# Patient Record
Sex: Female | Born: 1935 | Race: White | Hispanic: No | Marital: Single | Smoking: Never smoker
Health system: Southern US, Community
[De-identification: ages and names within clinical notes are randomized; demographics above are authoritative.]

## PROBLEM LIST (undated history)

## (undated) DIAGNOSIS — K922 Gastrointestinal hemorrhage, unspecified: Secondary | ICD-10-CM

## (undated) DIAGNOSIS — S82009A Unspecified fracture of unspecified patella, initial encounter for closed fracture: Secondary | ICD-10-CM

## (undated) DIAGNOSIS — I10 Essential (primary) hypertension: Secondary | ICD-10-CM

## (undated) DIAGNOSIS — E039 Hypothyroidism, unspecified: Secondary | ICD-10-CM

## (undated) DIAGNOSIS — S72009A Fracture of unspecified part of neck of unspecified femur, initial encounter for closed fracture: Secondary | ICD-10-CM

## (undated) DIAGNOSIS — I495 Sick sinus syndrome: Secondary | ICD-10-CM

## (undated) DIAGNOSIS — I509 Heart failure, unspecified: Secondary | ICD-10-CM

## (undated) DIAGNOSIS — M1990 Unspecified osteoarthritis, unspecified site: Secondary | ICD-10-CM

## (undated) DIAGNOSIS — M810 Age-related osteoporosis without current pathological fracture: Secondary | ICD-10-CM

## (undated) DIAGNOSIS — E785 Hyperlipidemia, unspecified: Secondary | ICD-10-CM

## (undated) DIAGNOSIS — I350 Nonrheumatic aortic (valve) stenosis: Secondary | ICD-10-CM

## (undated) DIAGNOSIS — R0682 Tachypnea, not elsewhere classified: Secondary | ICD-10-CM

## (undated) DIAGNOSIS — Z950 Presence of cardiac pacemaker: Secondary | ICD-10-CM

## (undated) DIAGNOSIS — I4891 Unspecified atrial fibrillation: Secondary | ICD-10-CM

## (undated) HISTORY — DX: Gastrointestinal hemorrhage, unspecified: K92.2

## (undated) HISTORY — PX: HUMERUS FRACTURE SURGERY: SHX670

## (undated) HISTORY — DX: Sick sinus syndrome: I49.5

## (undated) HISTORY — DX: Unspecified osteoarthritis, unspecified site: M19.90

## (undated) HISTORY — DX: Tachypnea, not elsewhere classified: R06.82

## (undated) HISTORY — DX: Fracture of unspecified part of neck of unspecified femur, initial encounter for closed fracture: S72.009A

## (undated) HISTORY — DX: Nonrheumatic aortic (valve) stenosis: I35.0

## (undated) HISTORY — DX: Heart failure, unspecified: I50.9

## (undated) HISTORY — DX: Age-related osteoporosis without current pathological fracture: M81.0

---

## 2009-09-28 ENCOUNTER — Encounter: Admission: RE | Admit: 2009-09-28 | Discharge: 2009-12-01 | Payer: Self-pay

## 2011-08-09 ENCOUNTER — Emergency Department (HOSPITAL_COMMUNITY): Payer: Non-veteran care

## 2011-08-09 ENCOUNTER — Encounter (HOSPITAL_COMMUNITY): Payer: Self-pay

## 2011-08-09 ENCOUNTER — Inpatient Hospital Stay (HOSPITAL_COMMUNITY)
Admission: EM | Admit: 2011-08-09 | Discharge: 2011-08-16 | DRG: 469 | Disposition: A | Discharge status: Skilled Nursing Facility | Payer: Non-veteran care | Attending: Internal Medicine | Admitting: Internal Medicine

## 2011-08-09 DIAGNOSIS — E785 Hyperlipidemia, unspecified: Secondary | ICD-10-CM | POA: Diagnosis present

## 2011-08-09 DIAGNOSIS — Y9229 Other specified public building as the place of occurrence of the external cause: Secondary | ICD-10-CM

## 2011-08-09 DIAGNOSIS — S42213A Unspecified displaced fracture of surgical neck of unspecified humerus, initial encounter for closed fracture: Secondary | ICD-10-CM

## 2011-08-09 DIAGNOSIS — W010XXA Fall on same level from slipping, tripping and stumbling without subsequent striking against object, initial encounter: Secondary | ICD-10-CM | POA: Diagnosis present

## 2011-08-09 DIAGNOSIS — Y998 Other external cause status: Secondary | ICD-10-CM

## 2011-08-09 DIAGNOSIS — R141 Gas pain: Secondary | ICD-10-CM | POA: Diagnosis present

## 2011-08-09 DIAGNOSIS — R142 Eructation: Secondary | ICD-10-CM | POA: Diagnosis present

## 2011-08-09 DIAGNOSIS — D62 Acute posthemorrhagic anemia: Secondary | ICD-10-CM | POA: Diagnosis not present

## 2011-08-09 DIAGNOSIS — Z79899 Other long term (current) drug therapy: Secondary | ICD-10-CM

## 2011-08-09 DIAGNOSIS — Z7901 Long term (current) use of anticoagulants: Secondary | ICD-10-CM

## 2011-08-09 DIAGNOSIS — R011 Cardiac murmur, unspecified: Secondary | ICD-10-CM

## 2011-08-09 DIAGNOSIS — I350 Nonrheumatic aortic (valve) stenosis: Secondary | ICD-10-CM | POA: Diagnosis present

## 2011-08-09 DIAGNOSIS — R140 Abdominal distension (gaseous): Secondary | ICD-10-CM | POA: Diagnosis present

## 2011-08-09 DIAGNOSIS — S72009A Fracture of unspecified part of neck of unspecified femur, initial encounter for closed fracture: Secondary | ICD-10-CM | POA: Diagnosis present

## 2011-08-09 DIAGNOSIS — E039 Hypothyroidism, unspecified: Secondary | ICD-10-CM | POA: Diagnosis present

## 2011-08-09 DIAGNOSIS — I4891 Unspecified atrial fibrillation: Secondary | ICD-10-CM | POA: Diagnosis present

## 2011-08-09 DIAGNOSIS — I359 Nonrheumatic aortic valve disorder, unspecified: Secondary | ICD-10-CM | POA: Diagnosis present

## 2011-08-09 DIAGNOSIS — I959 Hypotension, unspecified: Secondary | ICD-10-CM | POA: Diagnosis not present

## 2011-08-09 DIAGNOSIS — R0682 Tachypnea, not elsewhere classified: Secondary | ICD-10-CM

## 2011-08-09 DIAGNOSIS — A498 Other bacterial infections of unspecified site: Secondary | ICD-10-CM | POA: Diagnosis present

## 2011-08-09 DIAGNOSIS — N390 Urinary tract infection, site not specified: Secondary | ICD-10-CM | POA: Diagnosis present

## 2011-08-09 DIAGNOSIS — G9340 Encephalopathy, unspecified: Secondary | ICD-10-CM | POA: Diagnosis not present

## 2011-08-09 DIAGNOSIS — Z950 Presence of cardiac pacemaker: Secondary | ICD-10-CM

## 2011-08-09 DIAGNOSIS — I10 Essential (primary) hypertension: Secondary | ICD-10-CM | POA: Diagnosis present

## 2011-08-09 HISTORY — DX: Hypothyroidism, unspecified: E03.9

## 2011-08-09 HISTORY — DX: Hyperlipidemia, unspecified: E78.5

## 2011-08-09 HISTORY — DX: Presence of cardiac pacemaker: Z95.0

## 2011-08-09 HISTORY — DX: Unspecified atrial fibrillation: I48.91

## 2011-08-09 HISTORY — DX: Essential (primary) hypertension: I10

## 2011-08-09 LAB — CBC WITH DIFFERENTIAL/PLATELET
Basophils Absolute: 0 10*3/uL (ref 0.0–0.1)
Eosinophils Relative: 0 % (ref 0–5)
Lymphocytes Relative: 18 % (ref 12–46)
MCV: 93 fL (ref 78.0–100.0)
Platelets: 212 10*3/uL (ref 150–400)
RDW: 13.4 % (ref 11.5–15.5)
WBC: 15.1 10*3/uL — ABNORMAL HIGH (ref 4.0–10.5)

## 2011-08-09 LAB — BASIC METABOLIC PANEL
CO2: 26 mEq/L (ref 19–32)
Calcium: 9.6 mg/dL (ref 8.4–10.5)
GFR calc non Af Amer: 72 mL/min — ABNORMAL LOW (ref >90)
Sodium: 143 mEq/L (ref 135–145)

## 2011-08-09 MED ORDER — FENTANYL CITRATE 0.05 MG/ML IJ SOLN
100.0000 ug | Freq: Once | INTRAMUSCULAR | Status: AC
  Administered 2011-08-09: 100 ug via INTRAVENOUS
  Filled 2011-08-09: qty 2

## 2011-08-09 NOTE — ED Notes (Signed)
Pt's POA called with contact info: Lamar Blinks (son) cell # [phone], home # [phone]. POA states he is in [place] now but will be back tomorrow.

## 2011-08-09 NOTE — ED Notes (Addendum)
Pt was at church at fell, injuring L hip. Pt states pain is greater in the L foot than leg or hip. Pt's L leg is shorter and externally rotated. Current pain 3/10. 50 mcg fentanyl given previously.

## 2011-08-09 NOTE — ED Notes (Signed)
Pt was at church when her clothing hooked on an object, she fell and hurt her L hip.  Per EMS L foot inverted, though pt was sitting on arrival and in pain 9/10.  Pt given 50 mcg fentanyl which reduced pain to 3/10.  Per ems, pt has pacemaker but is unclear of other medical hx or medications.

## 2011-08-10 ENCOUNTER — Encounter (HOSPITAL_COMMUNITY)
Admission: EM | Disposition: A | Discharge status: Skilled Nursing Facility | Payer: Self-pay | Source: Home / Self Care | Attending: Internal Medicine

## 2011-08-10 ENCOUNTER — Encounter (HOSPITAL_COMMUNITY): Payer: Self-pay | Admitting: Internal Medicine

## 2011-08-10 DIAGNOSIS — R140 Abdominal distension (gaseous): Secondary | ICD-10-CM | POA: Diagnosis present

## 2011-08-10 DIAGNOSIS — Z7901 Long term (current) use of anticoagulants: Secondary | ICD-10-CM

## 2011-08-10 DIAGNOSIS — N390 Urinary tract infection, site not specified: Secondary | ICD-10-CM | POA: Diagnosis present

## 2011-08-10 DIAGNOSIS — I4891 Unspecified atrial fibrillation: Secondary | ICD-10-CM

## 2011-08-10 DIAGNOSIS — R011 Cardiac murmur, unspecified: Secondary | ICD-10-CM

## 2011-08-10 DIAGNOSIS — R143 Flatulence: Secondary | ICD-10-CM

## 2011-08-10 DIAGNOSIS — S72009A Fracture of unspecified part of neck of unspecified femur, initial encounter for closed fracture: Secondary | ICD-10-CM | POA: Diagnosis present

## 2011-08-10 DIAGNOSIS — S42213A Unspecified displaced fracture of surgical neck of unspecified humerus, initial encounter for closed fracture: Secondary | ICD-10-CM

## 2011-08-10 LAB — COMPREHENSIVE METABOLIC PANEL
ALT: 13 U/L (ref 0–35)
Alkaline Phosphatase: 56 U/L (ref 39–117)
CO2: 23 mEq/L (ref 19–32)
Chloride: 101 mEq/L (ref 96–112)
GFR calc Af Amer: 89 mL/min — ABNORMAL LOW (ref >90)
GFR calc non Af Amer: 77 mL/min — ABNORMAL LOW (ref >90)
Glucose, Bld: 120 mg/dL — ABNORMAL HIGH (ref 70–99)
Potassium: 3.7 mEq/L (ref 3.5–5.1)
Sodium: 136 mEq/L (ref 135–145)
Total Protein: 6.5 g/dL (ref 6.0–8.3)

## 2011-08-10 LAB — CARDIAC PANEL(CRET KIN+CKTOT+MB+TROPI)
CK, MB: 4 ng/mL (ref 0.3–4.0)
Relative Index: 2 (ref 0.0–2.5)
Total CK: 104 U/L (ref 7–177)
Total CK: 205 U/L — ABNORMAL HIGH (ref 7–177)
Troponin I: <0.3 ng/mL (ref <0.30)

## 2011-08-10 LAB — PROTIME-INR: Prothrombin Time: 23.8 seconds — ABNORMAL HIGH (ref 11.6–15.2)

## 2011-08-10 LAB — URINE MICROSCOPIC-ADD ON

## 2011-08-10 LAB — URINALYSIS, ROUTINE W REFLEX MICROSCOPIC
Bilirubin Urine: NEGATIVE
Glucose, UA: NEGATIVE mg/dL
Specific Gravity, Urine: 1.014 (ref 1.005–1.030)
pH: 7.5 (ref 5.0–8.0)

## 2011-08-10 LAB — TYPE AND SCREEN
ABO/RH(D): O POS
Antibody Screen: NEGATIVE

## 2011-08-10 LAB — CBC
HCT: 41.3 % (ref 36.0–46.0)
MCHC: 33.7 g/dL (ref 30.0–36.0)
RDW: 13.2 % (ref 11.5–15.5)

## 2011-08-10 SURGERY — HEMIARTHROPLASTY, HIP, DIRECT ANTERIOR APPROACH, FOR FRACTURE
Anesthesia: General | Laterality: Left

## 2011-08-10 MED ORDER — METOPROLOL TARTRATE 25 MG PO TABS
25.0000 mg | ORAL_TABLET | Freq: Two times a day (BID) | ORAL | Status: DC
  Administered 2011-08-10 – 2011-08-15 (×9): 25 mg via ORAL
  Filled 2011-08-10 (×13): qty 1

## 2011-08-10 MED ORDER — AMIODARONE HCL IN DEXTROSE 360-4.14 MG/200ML-% IV SOLN
0.5000 mg/min | INTRAVENOUS | Status: DC
  Administered 2011-08-11 – 2011-08-13 (×5): 0.5 mg/min via INTRAVENOUS
  Filled 2011-08-10 (×13): qty 200

## 2011-08-10 MED ORDER — ONDANSETRON HCL 4 MG/2ML IJ SOLN
4.0000 mg | Freq: Four times a day (QID) | INTRAMUSCULAR | Status: DC | PRN
  Administered 2011-08-12: 4 mg via INTRAVENOUS

## 2011-08-10 MED ORDER — SODIUM CHLORIDE 0.9 % IJ SOLN
3.0000 mL | Freq: Two times a day (BID) | INTRAMUSCULAR | Status: DC
  Administered 2011-08-11: 3 mL via INTRAVENOUS

## 2011-08-10 MED ORDER — AMIODARONE HCL IN DEXTROSE 360-4.14 MG/200ML-% IV SOLN
1.0000 mg/min | INTRAVENOUS | Status: AC
  Administered 2011-08-10 (×2): 1 mg/min via INTRAVENOUS
  Filled 2011-08-10 (×3): qty 200

## 2011-08-10 MED ORDER — ONDANSETRON HCL 4 MG PO TABS: 4.0000 mg | ORAL_TABLET | Freq: Four times a day (QID) | ORAL | Status: DC | PRN

## 2011-08-10 MED ORDER — SODIUM CHLORIDE 0.9 % IV SOLN
INTRAVENOUS | Status: AC
  Administered 2011-08-10: 06:00:00 via INTRAVENOUS

## 2011-08-10 MED ORDER — ACETAMINOPHEN 650 MG RE SUPP: 650.0000 mg | Freq: Four times a day (QID) | RECTAL | Status: DC | PRN

## 2011-08-10 MED ORDER — PHYTONADIONE 5 MG PO TABS
2.5000 mg | ORAL_TABLET | Freq: Once | ORAL | Status: AC
  Administered 2011-08-10: 2.5 mg via ORAL
  Filled 2011-08-10: qty 1

## 2011-08-10 MED ORDER — METOPROLOL TARTRATE 1 MG/ML IV SOLN: 2.5000 mg | Freq: Two times a day (BID) | INTRAVENOUS | Status: DC

## 2011-08-10 MED ORDER — DEXTROSE 5 % IV SOLN
1.0000 g | INTRAVENOUS | Status: DC
  Administered 2011-08-10 – 2011-08-15 (×5): 1 g via INTRAVENOUS
  Filled 2011-08-10 (×6): qty 10

## 2011-08-10 MED ORDER — DILTIAZEM LOAD VIA INFUSION
10.0000 mg | Freq: Once | INTRAVENOUS | Status: AC
  Administered 2011-08-10: 10 mg via INTRAVENOUS
  Filled 2011-08-10: qty 10

## 2011-08-10 MED ORDER — DILTIAZEM HCL 25 MG/5ML IV SOLN
5.0000 mg | Freq: Once | INTRAVENOUS | Status: AC | PRN
  Administered 2011-08-10: 5 mg via INTRAVENOUS
  Filled 2011-08-10 (×2): qty 5

## 2011-08-10 MED ORDER — ALBUTEROL SULFATE (5 MG/ML) 0.5% IN NEBU: 2.5000 mg | INHALATION_SOLUTION | RESPIRATORY_TRACT | Status: DC | PRN

## 2011-08-10 MED ORDER — ACETAMINOPHEN 325 MG PO TABS: 650.0000 mg | ORAL_TABLET | Freq: Four times a day (QID) | ORAL | Status: DC | PRN

## 2011-08-10 MED ORDER — DILTIAZEM HCL 25 MG/5ML IV SOLN
5.0000 mg | Freq: Once | INTRAVENOUS | Status: AC
  Administered 2011-08-10: 5 mg via INTRAVENOUS
  Filled 2011-08-10: qty 5

## 2011-08-10 MED ORDER — ONDANSETRON HCL 4 MG/2ML IJ SOLN
4.0000 mg | Freq: Once | INTRAMUSCULAR | Status: DC
  Filled 2011-08-10: qty 2

## 2011-08-10 MED ORDER — MORPHINE SULFATE 2 MG/ML IJ SOLN
2.0000 mg | INTRAMUSCULAR | Status: DC | PRN
  Administered 2011-08-10 – 2011-08-11 (×5): 2 mg via INTRAVENOUS
  Filled 2011-08-10 (×5): qty 1

## 2011-08-10 MED ORDER — FENTANYL CITRATE 0.05 MG/ML IJ SOLN: 50.0000 ug | Freq: Once | INTRAMUSCULAR | Status: DC

## 2011-08-10 MED ORDER — DILTIAZEM HCL 100 MG IV SOLR
5.0000 mg/h | INTRAVENOUS | Status: DC
  Administered 2011-08-10: 5 mg/h via INTRAVENOUS
  Filled 2011-08-10: qty 100

## 2011-08-10 MED ORDER — AMIODARONE LOAD VIA INFUSION
150.0000 mg | Freq: Once | INTRAVENOUS | Status: AC
  Administered 2011-08-10: 150 mg via INTRAVENOUS
  Filled 2011-08-10: qty 83.34

## 2011-08-10 NOTE — ED Provider Notes (Signed)
Medical screening examination/treatment/procedure(s) were conducted as a shared visit with non-physician practitioner(s) and myself.  I personally evaluated the patient during the encounter. Patient evaluated for left hip pain after fall. Tender over left greater trochanter with skin intact and left lower extremity deformity present. Distal neurovascular intact. IV pain control with narcotics. Orthopedic consultation and plan medical admission.  Sunnie Nielsen, MD 08/10/11 [phone]

## 2011-08-10 NOTE — Progress Notes (Signed)
Referral rec'd for ?SNF. Patient is a 78yo with a hip fx and may require SNF at d/c. Patient currently unavailable to discuss d/c plans with me (echo?). Will ask CSW covering tomorrow to assess and begin discussions related to d/c needs (?SNF). Reece Levy, MSW, Theresia Majors [phone]

## 2011-08-10 NOTE — Consult Note (Signed)
Reason for Consult: Left hip fracture Referring Physician:  Dr. Shaune Pollack [name] is an 78 y.o. female.  HPI:  Patient brought to ER by EMS after a fall at church, she got caught on a strap on the ground and twisted her leg than fell injuring her left hip. The leg per EMS is shortened on the left side. The gave 50 mcg of fentanyl in route, which helped reduce the pain to a 3/10. The patient denies hitting her head or having neck pain. She has a pmh of pacemaker, hypertension and A-fib. Per family that patient is very cognitively aware without dementia. The patient appears uncomfortable but is in NAD. Dr. Charlann Boxer was consulted to see the patient for potential surgery. The case is discussed with the patient and family, however the son who is the power of attorney is not present. His number is given to [place] by family and he will be contacted. At this time the potential surgery is discussed as well as the risks, benefits and expectation, as well as the risks, benefits and expectation if surgery is not decided upon.  Past Medical History  Diagnosis Date  . Pacemaker   . A-fib   . Hypertension   . Hypothyroidism   . Hyperlipemia     Past Surgical History  Procedure Date  . Humerus fracture surgery     Family History  Problem Relation Age of Onset  . Bipolar disorder Mother     Social History:  reports that she has never smoked. She does not have any smokeless tobacco history on file. She reports that she drinks alcohol. She reports that she does not use illicit drugs.  Allergies: No Known Allergies  Medications: I have reviewed the patient's current medications.  Results for orders placed during the hospital encounter of 08/09/11 (from the past 48 hour(s))  CBC WITH DIFFERENTIAL     Status: Abnormal   Collection Time   08/09/11 10:56 PM      Component Value Range Comment   WBC 15.1 (*) 4.0 - 10.5 K/uL    RBC 4.46  3.87 - 5.11 MIL/uL    Hemoglobin 14.2  12.0 - 15.0 g/dL    HCT 91.4   78.2 - 95.6 %    MCV 93.0  78.0 - 100.0 fL    MCH 31.8  26.0 - 34.0 pg    MCHC 34.2  30.0 - 36.0 g/dL    RDW 21.3  08.6 - 57.8 %    Platelets 212  150 - 400 K/uL    Neutrophils Relative 77  43 - 77 %    Neutro Abs 11.6 (*) 1.7 - 7.7 K/uL    Lymphocytes Relative 18  12 - 46 %    Lymphs Abs 2.8  0.7 - 4.0 K/uL    Monocytes Relative 4  3 - 12 %    Monocytes Absolute 0.7  0.1 - 1.0 K/uL    Eosinophils Relative 0  0 - 5 %    Eosinophils Absolute 0.1  0.0 - 0.7 K/uL    Basophils Relative 0  0 - 1 %    Basophils Absolute 0.0  0.0 - 0.1 K/uL   BASIC METABOLIC PANEL     Status: Abnormal   Collection Time   08/09/11 10:56 PM      Component Value Range Comment   Sodium 143  135 - 145 mEq/L    Potassium 4.1  3.5 - 5.1 mEq/L    Chloride 104  96 -  112 mEq/L    CO2 26  19 - 32 mEq/L    Glucose, Bld 122 (*) 70 - 99 mg/dL    BUN 21  6 - 23 mg/dL    Creatinine, Ser 5.28  0.50 - 1.10 mg/dL    Calcium 9.6  8.4 - 41.3 mg/dL    GFR calc non Af Amer 72 (*) >90 mL/min    GFR calc Af Amer 84 (*) >90 mL/min   TYPE AND SCREEN     Status: Normal   Collection Time   08/09/11 11:30 PM      Component Value Range Comment   ABO/RH(D) O POS      Antibody Screen NEG      Sample Expiration 08/12/2011     ABO/RH     Status: Normal   Collection Time   08/09/11 11:30 PM      Component Value Range Comment   ABO/RH(D) O POS     PROTIME-INR     Status: Abnormal   Collection Time   08/10/11 12:41 AM      Component Value Range Comment   Prothrombin Time 23.8 (*) 11.6 - 15.2 seconds    INR 2.09 (*) 0.00 - 1.49   URINALYSIS, ROUTINE W REFLEX MICROSCOPIC     Status: Abnormal   Collection Time   08/10/11  1:03 AM      Component Value Range Comment   Color, Urine YELLOW  YELLOW    APPearance CLEAR  CLEAR    Specific Gravity, Urine 1.014  1.005 - 1.030    pH 7.5  5.0 - 8.0    Glucose, UA NEGATIVE  NEGATIVE mg/dL    Hgb urine dipstick TRACE (*) NEGATIVE    Bilirubin Urine NEGATIVE  NEGATIVE    Ketones, ur  NEGATIVE  NEGATIVE mg/dL    Protein, ur NEGATIVE  NEGATIVE mg/dL    Urobilinogen, UA 0.2  0.0 - 1.0 mg/dL    Nitrite POSITIVE (*) NEGATIVE    Leukocytes, UA TRACE (*) NEGATIVE   URINE MICROSCOPIC-ADD ON     Status: Abnormal   Collection Time   08/10/11  1:03 AM      Component Value Range Comment   Squamous Epithelial / LPF RARE  RARE    WBC, UA 11-20  <3 WBC/hpf    RBC / HPF 3-6  <3 RBC/hpf    Bacteria, UA MANY (*) RARE   TSH     Status: Normal   Collection Time   08/10/11  6:45 AM      Component Value Range Comment   TSH 0.357  0.350 - 4.500 uIU/mL   COMPREHENSIVE METABOLIC PANEL     Status: Abnormal   Collection Time   08/10/11  6:45 AM      Component Value Range Comment   Sodium 136  135 - 145 mEq/L    Potassium 3.7  3.5 - 5.1 mEq/L    Chloride 101  96 - 112 mEq/L    CO2 23  19 - 32 mEq/L    Glucose, Bld 120 (*) 70 - 99 mg/dL    BUN 18  6 - 23 mg/dL    Creatinine, Ser 2.44  0.50 - 1.10 mg/dL    Calcium 9.0  8.4 - 01.0 mg/dL    Total Protein 6.5  6.0 - 8.3 g/dL    Albumin 3.6  3.5 - 5.2 g/dL    AST 20  0 - 37 U/L    ALT 13  0 - 35  U/L    Alkaline Phosphatase 56  39 - 117 U/L    Total Bilirubin 0.4  0.3 - 1.2 mg/dL    GFR calc non Af Amer 77 (*) >90 mL/min    GFR calc Af Amer 89 (*) >90 mL/min   CBC     Status: Abnormal   Collection Time   08/10/11  6:45 AM      Component Value Range Comment   WBC 15.2 (*) 4.0 - 10.5 K/uL    RBC 4.44  3.87 - 5.11 MIL/uL    Hemoglobin 13.9  12.0 - 15.0 g/dL    HCT 40.9  81.1 - 91.4 %    MCV 93.0  78.0 - 100.0 fL    MCH 31.3  26.0 - 34.0 pg    MCHC 33.7  30.0 - 36.0 g/dL    RDW 78.2  95.6 - 21.3 %    Platelets 199  150 - 400 K/uL   CARDIAC PANEL(CRET KIN+CKTOT+MB+TROPI)     Status: Abnormal   Collection Time   08/10/11  9:54 AM      Component Value Range Comment   Total CK 104  7 - 177 U/L    CK, MB 3.4  0.3 - 4.0 ng/mL    Troponin I <0.30  <0.30 ng/mL    Relative Index 3.3 (*) 0.0 - 2.5     Dg Chest 1 View  08/09/2011   *RADIOLOGY REPORT*  Clinical Data: Preoperative exam, left hip fracture  CHEST - 1 VIEW  Comparison: None.  Findings: Dual lead left pacemaker in place.  Heart size is mildly enlarged without overt edema.  Mild central vascular congestion. No pleural effusion.  The minimal bibasilar curvilinear scarring or atelectasis.  IMPRESSION: Mild cardiomegaly without overt edema or focal acute finding.  Original Report Authenticated By: Harrel Lemon, M.D.   Dg Hip Complete Left  08/09/2011  *RADIOLOGY REPORT*  Clinical Data: Hip pain, fall  LEFT HIP - COMPLETE 2+ VIEW  Comparison: None.  Findings: Mildly angulated acute left femoral neck fracture identified.  Left femoral head is properly located.  No evidence of previous right femoral neck screw fixation.  Mild degenerative changes at the symphysis pubis.  Normal visualized bowel gas pattern.  No displaced pelvic fracture.  IMPRESSION: Mildly angulated acute left femoral neck fracture.  Original Report Authenticated By: Harrel Lemon, M.D.    Review of Systems  Constitutional: Negative.   HENT: Negative.   Eyes: Negative.   Respiratory: Negative.   Cardiovascular: Positive for palpitations.  Gastrointestinal: Negative.   Genitourinary: Negative.   Musculoskeletal: Positive for joint pain (left hip) and falls.  Skin: Negative.   Neurological: Negative.   Endo/Heme/Allergies: Negative.   Psychiatric/Behavioral: Negative.    Blood pressure 112/65, pulse 134, temperature 97.7 F (36.5 C), temperature source Oral, resp. rate 21, height 5\' 2"  (1.575 m), weight 125 lb 1.6 oz (56.745 kg), SpO2 99.00%. Physical Exam  Constitutional: She appears well-developed and well-nourished.  HENT:  Head: Normocephalic and atraumatic.  Nose: Nose normal.  Mouth/Throat: Oropharynx is clear and moist.  Eyes: Pupils are equal, round, and reactive to light.  Neck: Neck supple. No JVD present. No tracheal deviation present. No thyromegaly present.    Cardiovascular: Normal rate and intact distal pulses.   Murmur heard. Respiratory: Effort normal and breath sounds normal. No respiratory distress. She has no wheezes. She has no rales. She exhibits no tenderness.  GI: Soft. There is no tenderness. There is no guarding.  Musculoskeletal:  Left hip: She exhibits decreased range of motion (pain with any motion), decreased strength, tenderness, bony tenderness and swelling. She exhibits no deformity and no laceration.  Lymphadenopathy:    She has no cervical adenopathy.  Skin: Skin is warm and dry.    Assessment/Plan: Acute left femoral neck fracture   Will discuss the patient case with her son, who is the power of attorney Awaiting medical clearance to proceed with surgery, if that how they would like to proceed   [name] 08/10/2011, 2:52 PM

## 2011-08-10 NOTE — Consult Note (Signed)
Reason for Consult: A. fib with RVR Referring Physician: Dr. Shaune Pollack [name] is an 78 y.o. female.  HPI: Patient is 78 year old female with past medical history significant for hypertension, hypothyroidism, history of paroxysmal A. fib, probable tachybradycardia syndrome, status post permanent pacemaker, degenerative joint disease, hypercholesteremia, was admitted yesterday following fall and sustained left hip fracture. Patient denies any palpitation lightheadedness or syncopal episode. Denies any chest pain pressure tightness heaviness nausea or vomiting diaphoresis. Denies history of PND orthopnea leg swelling. Denies any history of  rheumatic fever. Patient denies any history of exertional dyspnea on exertional chest pain although activity is limited due to her age. Patient received IV Cardizem and started on drip and also by mouth Lopressor but continues to have heart rate in the 120s to 130s. Admission EKG showed patient was in sinus rhythm.  Past Medical History  Diagnosis Date  . Pacemaker   . A-fib   . Hypertension   . Hypothyroidism   . Hyperlipemia     Past Surgical History  Procedure Date  . Humerus fracture surgery     Family History  Problem Relation Age of Onset  . Bipolar disorder Mother     Social History:  reports that she has never smoked. She does not have any smokeless tobacco history on file. She reports that she drinks alcohol. She reports that she does not use illicit drugs.  Allergies: No Known Allergies  Medications: I have reviewed the patient's current medications.  Results for orders placed during the hospital encounter of 08/09/11 (from the past 48 hour(s))  CBC WITH DIFFERENTIAL     Status: Abnormal   Collection Time   08/09/11 10:56 PM      Component Value Range Comment   WBC 15.1 (*) 4.0 - 10.5 K/uL    RBC 4.46  3.87 - 5.11 MIL/uL    Hemoglobin 14.2  12.0 - 15.0 g/dL    HCT 16.1  09.6 - 04.5 %    MCV 93.0  78.0 - 100.0 fL    MCH 31.8   26.0 - 34.0 pg    MCHC 34.2  30.0 - 36.0 g/dL    RDW 40.9  81.1 - 91.4 %    Platelets 212  150 - 400 K/uL    Neutrophils Relative 77  43 - 77 %    Neutro Abs 11.6 (*) 1.7 - 7.7 K/uL    Lymphocytes Relative 18  12 - 46 %    Lymphs Abs 2.8  0.7 - 4.0 K/uL    Monocytes Relative 4  3 - 12 %    Monocytes Absolute 0.7  0.1 - 1.0 K/uL    Eosinophils Relative 0  0 - 5 %    Eosinophils Absolute 0.1  0.0 - 0.7 K/uL    Basophils Relative 0  0 - 1 %    Basophils Absolute 0.0  0.0 - 0.1 K/uL   BASIC METABOLIC PANEL     Status: Abnormal   Collection Time   08/09/11 10:56 PM      Component Value Range Comment   Sodium 143  135 - 145 mEq/L    Potassium 4.1  3.5 - 5.1 mEq/L    Chloride 104  96 - 112 mEq/L    CO2 26  19 - 32 mEq/L    Glucose, Bld 122 (*) 70 - 99 mg/dL    BUN 21  6 - 23 mg/dL    Creatinine, Ser 7.82  0.50 - 1.10 mg/dL  Calcium 9.6  8.4 - 10.5 mg/dL    GFR calc non Af Amer 72 (*) >90 mL/min    GFR calc Af Amer 84 (*) >90 mL/min   TYPE AND SCREEN     Status: Normal   Collection Time   08/09/11 11:30 PM      Component Value Range Comment   ABO/RH(D) O POS      Antibody Screen NEG      Sample Expiration 08/12/2011     ABO/RH     Status: Normal   Collection Time   08/09/11 11:30 PM      Component Value Range Comment   ABO/RH(D) O POS     PROTIME-INR     Status: Abnormal   Collection Time   08/10/11 12:41 AM      Component Value Range Comment   Prothrombin Time 23.8 (*) 11.6 - 15.2 seconds    INR 2.09 (*) 0.00 - 1.49   URINALYSIS, ROUTINE W REFLEX MICROSCOPIC     Status: Abnormal   Collection Time   08/10/11  1:03 AM      Component Value Range Comment   Color, Urine YELLOW  YELLOW    APPearance CLEAR  CLEAR    Specific Gravity, Urine 1.014  1.005 - 1.030    pH 7.5  5.0 - 8.0    Glucose, UA NEGATIVE  NEGATIVE mg/dL    Hgb urine dipstick TRACE (*) NEGATIVE    Bilirubin Urine NEGATIVE  NEGATIVE    Ketones, ur NEGATIVE  NEGATIVE mg/dL    Protein, ur NEGATIVE  NEGATIVE mg/dL     Urobilinogen, UA 0.2  0.0 - 1.0 mg/dL    Nitrite POSITIVE (*) NEGATIVE    Leukocytes, UA TRACE (*) NEGATIVE   URINE MICROSCOPIC-ADD ON     Status: Abnormal   Collection Time   08/10/11  1:03 AM      Component Value Range Comment   Squamous Epithelial / LPF RARE  RARE    WBC, UA 11-20  <3 WBC/hpf    RBC / HPF 3-6  <3 RBC/hpf    Bacteria, UA MANY (*) RARE   TSH     Status: Normal   Collection Time   08/10/11  6:45 AM      Component Value Range Comment   TSH 0.357  0.350 - 4.500 uIU/mL   COMPREHENSIVE METABOLIC PANEL     Status: Abnormal   Collection Time   08/10/11  6:45 AM      Component Value Range Comment   Sodium 136  135 - 145 mEq/L    Potassium 3.7  3.5 - 5.1 mEq/L    Chloride 101  96 - 112 mEq/L    CO2 23  19 - 32 mEq/L    Glucose, Bld 120 (*) 70 - 99 mg/dL    BUN 18  6 - 23 mg/dL    Creatinine, Ser 9.60  0.50 - 1.10 mg/dL    Calcium 9.0  8.4 - 45.4 mg/dL    Total Protein 6.5  6.0 - 8.3 g/dL    Albumin 3.6  3.5 - 5.2 g/dL    AST 20  0 - 37 U/L    ALT 13  0 - 35 U/L    Alkaline Phosphatase 56  39 - 117 U/L    Total Bilirubin 0.4  0.3 - 1.2 mg/dL    GFR calc non Af Amer 77 (*) >90 mL/min    GFR calc Af Amer 89 (*) >90 mL/min  CBC     Status: Abnormal   Collection Time   08/10/11  6:45 AM      Component Value Range Comment   WBC 15.2 (*) 4.0 - 10.5 K/uL    RBC 4.44  3.87 - 5.11 MIL/uL    Hemoglobin 13.9  12.0 - 15.0 g/dL    HCT 16.1  09.6 - 04.5 %    MCV 93.0  78.0 - 100.0 fL    MCH 31.3  26.0 - 34.0 pg    MCHC 33.7  30.0 - 36.0 g/dL    RDW 40.9  81.1 - 91.4 %    Platelets 199  150 - 400 K/uL   CARDIAC PANEL(CRET KIN+CKTOT+MB+TROPI)     Status: Abnormal   Collection Time   08/10/11  9:54 AM      Component Value Range Comment   Total CK 104  7 - 177 U/L    CK, MB 3.4  0.3 - 4.0 ng/mL    Troponin I <0.30  <0.30 ng/mL    Relative Index 3.3 (*) 0.0 - 2.5     Dg Chest 1 View  08/09/2011  *RADIOLOGY REPORT*  Clinical Data: Preoperative exam, left hip fracture   CHEST - 1 VIEW  Comparison: None.  Findings: Dual lead left pacemaker in place.  Heart size is mildly enlarged without overt edema.  Mild central vascular congestion. No pleural effusion.  The minimal bibasilar curvilinear scarring or atelectasis.  IMPRESSION: Mild cardiomegaly without overt edema or focal acute finding.  Original Report Authenticated By: Harrel Lemon, M.D.   Dg Hip Complete Left  08/09/2011  *RADIOLOGY REPORT*  Clinical Data: Hip pain, fall  LEFT HIP - COMPLETE 2+ VIEW  Comparison: None.  Findings: Mildly angulated acute left femoral neck fracture identified.  Left femoral head is properly located.  No evidence of previous right femoral neck screw fixation.  Mild degenerative changes at the symphysis pubis.  Normal visualized bowel gas pattern.  No displaced pelvic fracture.  IMPRESSION: Mildly angulated acute left femoral neck fracture.  Original Report Authenticated By: Harrel Lemon, M.D.    Review of Systems  Constitutional: Negative for fever and chills.  Eyes: Negative for blurred vision and double vision.  Respiratory: Positive for cough. Negative for hemoptysis and sputum production.   Cardiovascular: Negative for chest pain, palpitations, orthopnea and claudication.  Gastrointestinal: Negative for heartburn, nausea and vomiting.  Neurological: Negative for dizziness and headaches.   Blood pressure 108/62, pulse 136, temperature 98 F (36.7 C), temperature source Oral, resp. rate 20, height 5\' 2"  (1.575 m), weight 56.745 kg (125 lb 1.6 oz), SpO2 98.00%. Physical Exam  Constitutional: She is oriented to person, place, and time.  HENT:  Head: Atraumatic.  Eyes: Conjunctivae are normal. Pupils are equal, round, and reactive to light. Left eye exhibits no discharge. Scleral icterus is present.  Neck: Neck supple. No tracheal deviation present. No thyromegaly present.  Respiratory: Effort normal and breath sounds normal. No respiratory distress. She has no wheezes.  She has no rales.  GI: Soft. Bowel sounds are normal. She exhibits distension. There is no tenderness. There is no rebound.  Musculoskeletal: She exhibits no edema and no tenderness.  Neurological: She is alert and oriented to person, place, and time.    Assessment/Plan: Atrial fibrillation with rapid ventricular response Status post fall/fracture left hip Hypertension  probable tachybradycardia syndrome status post permanent pacemaker Hypercholesteremia Degenerative joint disease UTI Hypothyroidism History of syncope in the past Plan Agree with present management  Check 2-D echo Check TSH Will start IV amiodarone per orders Check old records   [facility] N 08/10/2011, 12:48 PM

## 2011-08-10 NOTE — Progress Notes (Signed)
*  PRELIMINARY RESULTS* Echocardiogram 2D Echocardiogram has been performed.  [name] 08/10/2011, 3:43 PM

## 2011-08-10 NOTE — Progress Notes (Signed)
UR Completed.  Sakoya Win Jane [phone] 08/10/2011  

## 2011-08-10 NOTE — Progress Notes (Signed)
Patient admitted earlier today. H&P reviewed.  PCP: [facility]  Brief HPI:  [name] is a 78 y.o. female with a past medical history of Pacemaker; A-fib; Hypertension; Hypothyroidism; and Hyperlipemia.  Presented with Fall on day of admission after tripping on a purse while at church. She denied any head injury. She has been able to ambulate well at baseline. 3 years ago she says she had a syncopal episode and her heart stopped and ever since she needed a pacemaker. Denied any chest pain or dyspnea on exertion. Stated she is not sure if she ever had an echo but probably had cardiac catheterization 3 years ago.   Past medical history:  Past Medical History  Diagnosis Date  . Pacemaker   . A-fib   . Hypertension   . Hypothyroidism   . Hyperlipemia     Consultants: Gaylord Shih Charlann Boxer)  Procedures: None so far  Subjective: Patient complains of pain in left leg. Seems slightly agitated because of the pain. Denies any other complaints. No CP or SOB.  Objective: Vital signs in last 24 hours: Temp:  [97.9 F (36.6 C)-98 F (36.7 C)] 98 F (36.7 C) (07/18 0519) Pulse Rate:  [74-136] 136  (07/18 0907) Resp:  [20-28] 20  (07/18 0546) BP: (100-148)/(19-106) 108/62 mmHg (07/18 0907) SpO2:  [83 %-99 %] 98 % (07/18 0519) FiO2 (%):  [2 %] 2 % (07/17 2344) Weight:  [56.745 kg (125 lb 1.6 oz)] 56.745 kg (125 lb 1.6 oz) (07/18 0519) Weight change:  Last BM Date: 08/09/11  Intake/Output from previous day: 07/17 0701 - 07/18 0700 In: -  Out: 1300 [Urine:1300] Intake/Output this shift:    General appearance: alert, appears stated age, distracted and no distress Head: Normocephalic, without obvious abnormality, atraumatic Eyes: conjunctivae/corneas clear. PERRL, EOM's intact.  Resp: clear to auscultation bilaterally Cardio: irregularly irregular, tachycardic. Systolic murmur. No click, rub or gallop GI: soft, non-tender; bowel sounds normal; no masses,  no organomegaly Extremities: Left LE  externally rotated. Pulses: 2+ and symmetric Skin: Skin color, texture, turgor normal. No rashes or lesions Neurologic: Slightly agitated. Knew the place, date, month, year. No focal deficits.  Lab Results:  Basename 08/10/11 0645 08/09/11 2256  WBC 15.2* 15.1*  HGB 13.9 14.2  HCT 41.3 41.5  PLT 199 212   BMET  Basename 08/10/11 0645 08/09/11 2256  NA 136 143  K 3.7 4.1  CL 101 104  CO2 23 26  GLUCOSE 120* 122*  BUN 18 21  CREATININE 0.62 0.76  CALCIUM 9.0 9.6  ALT 13 --    Studies/Results: Dg Chest 1 View  08/09/2011  *RADIOLOGY REPORT*  Clinical Data: Preoperative exam, left hip fracture  CHEST - 1 VIEW  Comparison: None.  Findings: Dual lead left pacemaker in place.  Heart size is mildly enlarged without overt edema.  Mild central vascular congestion. No pleural effusion.  The minimal bibasilar curvilinear scarring or atelectasis.  IMPRESSION: Mild cardiomegaly without overt edema or focal acute finding.  Original Report Authenticated By: Harrel Lemon, M.D.   Dg Hip Complete Left  08/09/2011  *RADIOLOGY REPORT*  Clinical Data: Hip pain, fall  LEFT HIP - COMPLETE 2+ VIEW  Comparison: None.  Findings: Mildly angulated acute left femoral neck fracture identified.  Left femoral head is properly located.  No evidence of previous right femoral neck screw fixation.  Mild degenerative changes at the symphysis pubis.  Normal visualized bowel gas pattern.  No displaced pelvic fracture.  IMPRESSION: Mildly angulated acute left femoral neck  fracture.  Original Report Authenticated By: Harrel Lemon, M.D.    Medications:  Scheduled:    . cefTRIAXone (ROCEPHIN) IVPB 1 gram/50 mL D5W  1 g Intravenous Q24H  . diltiazem  5 mg Intravenous Once  . fentaNYL  100 mcg Intravenous Once  . fentaNYL  50 mcg Intravenous Once  . metoprolol tartrate  25 mg Oral BID  . ondansetron  4 mg Intravenous Once  . phytonadione  2.5 mg Oral Once  . sodium chloride  3 mL Intravenous Q12H  .  DISCONTD: metoprolol  2.5 mg Intravenous Q12H   Continuous:    . sodium chloride 50 mL/hr at 08/10/11 0543   ZOX:[id number], acetaminophen, albuterol, diltiazem, morphine injection, ondansetron (ZOFRAN) IV, ondansetron  Assessment/Plan:  Principal Problem:  *Atrial fibrillation with rapid ventricular response Active Problems:  Hip fracture  UTI (lower urinary tract infection)  Abdominal distention  Atrial fibrillation  Murmur, cardiac  Chronic anticoagulation    Atrial Fibrillation with RVR HR high probably due to pain and agitation. Temporary response to IV diltiazem bolus. Have given her metoprolol orally. If HR doesn't get controlled she will need infusion. Cycle cardiac enzymes. ECHO is pending. Obtain records from [place].  Left Hip fracture Awaiting consult by orthopedics. From medical stand point patient likely has valvular disease of unclear severity. Would obtain echocardiogram to evaluate further and will obtain records regarding cardiac catherization. Patient should not undergo surgery till ECHO is done. May need to involve cardiology as well due to tachyarrythmia.  Chronic Anticoagulation Patient is anticoagulated with history of atrial fibrillation. Will need to recheck INR. Warfarin has been discontinued.  UTI (lower urinary tract infection) Continue Ceftriaxone. Follow up urine culture.  Abdominal distention None noted this morning. Abdomen is soft and non tender. Monitor.  Code Status Full Code  DVT Prophylaxis INR was therapeutic. Will need to resume post operatively.  Will discuss with Son.   LOS: 1 day   [facility]  [facility] Pager [phone] 08/10/2011, 9:43 AM

## 2011-08-10 NOTE — Progress Notes (Signed)
  Amiodarone Drug - Drug Interaction Consult Note  Recommendations: - Coumadin: now on hold but when/if resumed, watch for drug interaction as amiodarone can increase INR, coumadin requirements may decrease -Metoprolol: watch for bradycardia  Amiodarone is metabolized by the cytochrome P450 system and therefore has the potential to cause many drug interactions. Amiodarone has an average plasma half-life of 50 days (range 20 to 100 days).   There is potential for drug interactions to occur several weeks or months after stopping treatment and the onset of drug interactions may be slow after initiating amiodarone.   []  Statins: Increased risk of myopathy. Simvastatin- restrict dose to 20mg  daily. Other statins: counsel patients to report any muscle pain or weakness immediately.  [x]  Anticoagulants: Amiodarone can increase anticoagulant effect. Consider warfarin dose reduction. Patients should be monitored closely and the dose of anticoagulant altered accordingly, remembering that amiodarone levels take several weeks to stabilize.  []  Antiepileptics: Amiodarone can increase plasma concentration of phenytoin, phenytoin dose should be reduced. Note that small changes in phenytoin dose can result in large changes in phenytoin levels. Monitor patient closely and counsel on signs of toxicity.  [x]  Beta blockers: increased risk of bradycardia, AV block and myocardial depression. Sotalol - avoid concomitant use.  []   Calcium channel blockers (diltiazem and verapamil): increased risk of bradycardia, AV block and myocardial depression.  []   Cyclosporine: Amiodarone increases levels of cyclosporine. Reduced dose of cyclosporine is recommended.  []  Digoxin dose should be halved when amiodarone is started.  []  Diuretics: increased risk of cardiotoxicity if hypokalemia occurs.  []  Oral hypoglycemic agents (glyburide, glipizide, glimepiride): increased risk of hypoglycemia. Patient's glucose levels should  be monitored closely when initiating amiodarone therapy.   []  Drugs that prolong the QT interval: Concurrent therapy is contraindicated due to the increased risk of torsades de pointes; . Antibiotics: e.g. fluoroquinolones, erythromycin. . Antiarrhythmics: e.g. quinidine, procainamide, disopyramide, sotalol. . Antipsychotics: e.g. phenothiazines, haloperidol.  . Lithium, tricyclic antidepressants, and methadone. Thank You,  Fredrik Rigger  08/10/2011 1:10 PM

## 2011-08-10 NOTE — H&P (Signed)
PCP:  [facility]   Chief Complaint:   Hip pain  HPI: [name] is a 78 y.o. female   has a past medical history of Pacemaker; A-fib; Hypertension; Hypothyroidism; and Hyperlipemia.   Presented with  Fall today after tripping on a purse while at church. She denies any head injury. She is able to ambulate well at baseline. 3 years ago she says she had a syncopal episode and her heart stopped eversince she needed a pacemaker.  Denies any chest pain  Or dyspnea on exertion. States she is not sure if she ever had an echo but probably had cardiac catheterization 3 years ago. All her records should be in [place].  Patient did mention she have been having trouble with abdominal distention and attributes this to constipation.   Review of Systems:    Pertinent positives include: hip pain, abdominal distention  Constitutional:  No weight loss, night sweats, Fevers, chills, fatigue, weight loss  HEENT:  No headaches, Difficulty swallowing,Tooth/dental problems,Sore throat,  No sneezing, itching, ear ache, nasal congestion, post nasal drip,  Cardio-vascular:  No chest pain, Orthopnea, PND, anasarca, dizziness, palpitations.no Bilateral lower extremity swelling  GI:  No heartburn, indigestion, abdominal pain, nausea, vomiting, diarrhea, change in bowel habits, loss of appetite, melena, blood in stool, hematemesis Resp:  no shortness of breath at rest. No dyspnea on exertion, No excess mucus, no productive cough, No non-productive cough, No coughing up of blood.No change in color of mucus.No wheezing. Skin:  no rash or lesions. No jaundice GU:  no dysuria, change in color of urine, no urgency or frequency. No straining to urinate.  No flank pain.  Musculoskeletal:  No joint pain or no joint swelling. No decreased range of motion. No back pain.  Psych:  No change in mood or affect. No depression or anxiety. No memory loss.  Neuro: no localizing neurological complaints, no tingling, no  weakness, no double vision, no gait abnormality, no slurred speech, no confusion  Otherwise ROS are negative except for above, 10 systems were reviewed  Past Medical History: Past Medical History  Diagnosis Date  . Pacemaker   . A-fib   . Hypertension   . Hypothyroidism   . Hyperlipemia    Past Surgical History  Procedure Date  . Humerus fracture surgery      Medications: Prior to Admission medications   Medication Sig Start Date End Date Taking? Authorizing Provider  amLODipine (NORVASC) 5 MG tablet Take 5 mg by mouth daily.   Yes Historical Provider, MD  feeding supplement (BOOST HIGH PROTEIN) LIQD Take 1 Container by mouth every morning.   Yes Historical Provider, MD  levothyroxine (SYNTHROID, LEVOTHROID) 100 MCG tablet Take 100 mcg by mouth daily.   Yes Historical Provider, MD  metoprolol tartrate (LOPRESSOR) 25 MG tablet Take 25 mg by mouth 2 (two) times daily.   Yes Historical Provider, MD  rosuvastatin (CRESTOR) 5 MG tablet Take 2.5 mg by mouth at bedtime.   Yes Historical Provider, MD  traZODone (DESYREL) 50 MG tablet Take 25 mg by mouth at bedtime.   Yes Historical Provider, MD  warfarin (COUMADIN) 5 MG tablet Take 5 mg by mouth at bedtime. Pt takes 5mg  6 days per week. 1 day per week pt takes 2.5mg  on sunday   Yes Historical Provider, MD    Allergies:  No Known Allergies  Social History:  Ambulatory  independently Lives at home alone   reports that she has never smoked. She does not have any smokeless  tobacco history on file. She reports that she drinks alcohol. She reports that she does not use illicit drugs.   Family History: family history includes Bipolar disorder in her mother.    Physical Exam: Patient Vitals for the past 24 hrs:  BP Temp Temp src Pulse SpO2  08/09/11 2344 118/60 mmHg - - 81  95 %  08/09/11 2234 108/70 mmHg 97.9 F (36.6 C) Oral - 95 %    1. General:  in No Acute distress 2. Psychological: Alert and  Oriented 3. Head/ENT:    Dry  Mucous Membranes                          Head Non traumatic, neck supple                           Poor Dentition 4. SKIN:  decreased Skin turgor,  Skin clean Dry and intact no rash 5. Heart: Regular rate and rhythm loud ejection  Murmur, Rub or gallop 6. Lungs: Clear to auscultation bilaterally, no wheezes or crackles   7. Abdomen: Soft, suprapubic-tenderness,  distended 8. Lower extremities: no clubbing, cyanosis, or edema 9. Neurologically Grossly intact, moving all 4 extremities equally 10. MSK: Normal range of motion of upper extr. Left leg painful  body mass index is unknown because there is no height or weight on file.   Labs on Admission:   [facility] 08/09/11 2256  NA 143  K 4.1  CL 104  CO2 26  GLUCOSE 122*  BUN 21  CREATININE 0.76  CALCIUM 9.6  MG --  PHOS --   No results found for this basename: AST:2,ALT:2,ALKPHOS:2,BILITOT:2,PROT:2,ALBUMIN:2 in the last 72 hours No results found for this basename: LIPASE:2,AMYLASE:2 in the last 72 hours  Basename 08/09/11 2256  WBC 15.1*  NEUTROABS 11.6*  HGB 14.2  HCT 41.5  MCV 93.0  PLT 212   No results found for this basename: CKTOTAL:3,CKMB:3,CKMBINDEX:3,TROPONINI:3 in the last 72 hours No results found for this basename: TSH,T4TOTAL,FREET3,T3FREE,THYROIDAB in the last 72 hours No results found for this basename: VITAMINB12:2,FOLATE:2,FERRITIN:2,TIBC:2,IRON:2,RETICCTPCT:2 in the last 72 hours No results found for this basename: HGBA1C    CrCl is unknown because there is no height on file for the current visit. ABG No results found for this basename: phart, pco2, po2, hco3, tco2, acidbasedef, o2sat     No results found for this basename: DDIMER     Other results:   UA Evidence of infection   Cultures: No results found for this basename: sdes, specrequest, cult, reptstatus       Radiological Exams on Admission: Dg Chest 1 View  08/09/2011  *RADIOLOGY REPORT*  Clinical Data: Preoperative exam, left hip  fracture  CHEST - 1 VIEW  Comparison: None.  Findings: Dual lead left pacemaker in place.  Heart size is mildly enlarged without overt edema.  Mild central vascular congestion. No pleural effusion.  The minimal bibasilar curvilinear scarring or atelectasis.  IMPRESSION: Mild cardiomegaly without overt edema or focal acute finding.  Original Report Authenticated By: Harrel Lemon, M.D.   Dg Hip Complete Left  08/09/2011  *RADIOLOGY REPORT*  Clinical Data: Hip pain, fall  LEFT HIP - COMPLETE 2+ VIEW  Comparison: None.  Findings: Mildly angulated acute left femoral neck fracture identified.  Left femoral head is properly located.  No evidence of previous right femoral neck screw fixation.  Mild degenerative changes at the symphysis pubis.  Normal visualized  bowel gas pattern.  No displaced pelvic fracture.  IMPRESSION: Mildly angulated acute left femoral neck fracture.  Original Report Authenticated By: Harrel Lemon, M.D.    Chart has been reviewed  Assessment/Plan  77 yo F who had a mechanical fall resulting in left femoral neck fracture, she was noted to have UTI and  likely has aortic stenosis  Present on Admission:  .Hip fracture - awaiting consult by orthopedics. From medical stand point patient likely has valvular disease of unclear severity. Would obtain echogram to evaluate further and try to obtain records.  Patient is anticoagulated with history of atrial fibrillation will need to recheck INR. Will stop coumadin and give small dose of vitamin K.   .UTI (lower urinary tract infection) - rocephin, urine culture .Abdominal distention - patient refusing any further evaluation at this point would recommend readdressing in am, she is stable, no peritoneal signs noted.   .Atrial fibrillation - will obtain ECG, while NPO for possible OR time will give metoprolol IV, hold off on coumadin .Murmur, cardiac - will obtain records from [facility] and obtain echogram.    Prophylaxis: SCD  Protonix  CODE STATUS: FULL CODE discussed with patient  Other plan as per orders.  I have spent a total of 55 min on this admission  Quintessa Simmerman 08/10/2011, 2:04 AM

## 2011-08-10 NOTE — ED Provider Notes (Signed)
History     CSN: [id number]  Arrival date & time 08/09/11  2202   First MD Initiated Contact with Patient 08/09/11 2234      Chief Complaint  Patient presents with  . Hip Pain    L    (Consider location/radiation/quality/duration/timing/severity/associated sxs/prior treatment) HPI  Patient brought to ER by EMS after a fall at church, she got caught on a strap on the ground and twisted her leg than fell injuring her left hip. The leg per EMS is shortened on the left side. The gave 50 mcg of fentanyl in route, which helped reduce the pain to a 3/10. The patient denies hitting her head or having neck pain. She has a pmh of pacemaker, hypertension and A-fib. Per family that patient is very cognitively aware without dementia. The patient appears uncomfortable put is in NAD.  Past Medical History  Diagnosis Date  . Pacemaker   . A-fib   . Hypertension     Past Surgical History  Procedure Date  . Humerus fracture surgery     No family history on file.  History  Substance Use Topics  . Smoking status: Not on file  . Smokeless tobacco: Not on file  . Alcohol Use:     OB History    Grav Para Term Preterm Abortions TAB SAB Ect Mult Living                  Review of Systems   HEENT: denies blurry vision or change in hearing PULMONARY: Denies difficulty breathing and SOB CARDIAC: denies chest pain or heart palpitations MUSCULOSKELETAL:  unable to ambulate ABDOMEN AL: denies abdominal pain GU: denies loss of bowel or urinary control NEURO: denies numbness and tingling in extremities SKIN: no new rashes PSYCH: patient denies anxiety or depression. NECK: Pt denies having neck pain     Allergies  Review of patient's allergies indicates no known allergies.  Home Medications   Current Outpatient Rx  Name Route Sig Dispense Refill  . AMLODIPINE BESYLATE 5 MG PO TABS Oral Take 5 mg by mouth daily.    Marland Kitchen BOOST HIGH PROTEIN PO LIQD Oral Take 1 Container by mouth every  morning.    Marland Kitchen LEVOTHYROXINE SODIUM 100 MCG PO TABS Oral Take 100 mcg by mouth daily.    Marland Kitchen METOPROLOL TARTRATE 25 MG PO TABS Oral Take 25 mg by mouth 2 (two) times daily.    Marland Kitchen ROSUVASTATIN CALCIUM 5 MG PO TABS Oral Take 2.5 mg by mouth at bedtime.    . TRAZODONE HCL 50 MG PO TABS Oral Take 25 mg by mouth at bedtime.    . WARFARIN SODIUM 5 MG PO TABS Oral Take 5 mg by mouth at bedtime. Pt takes 5mg  6 days per week. 1 day per week pt takes 2.5mg .      BP 118/60  Pulse 81  Temp 97.9 F (36.6 C) (Oral)  SpO2 95%  Physical Exam  Nursing note and vitals reviewed. Constitutional: She appears well-developed and well-nourished. No distress.  HENT:  Head: Normocephalic and atraumatic.  Eyes: Pupils are equal, round, and reactive to light.  Neck: Normal range of motion. Neck supple.  Cardiovascular: Normal rate and regular rhythm.   Pulmonary/Chest: Effort normal.  Abdominal: Soft.  Musculoskeletal:       Left hip: She exhibits decreased range of motion, decreased strength, tenderness, bony tenderness and swelling. She exhibits no laceration. Deformity: shortnening of left leg.       Pt has adequate pedal  pulses bilaterally. Her skin is warm and moist Capillary refill is less than 3 seconds in great toe Pt unable to tolerate movement of the leg due to pain  Neurological: She is alert.  Skin: Skin is warm and dry.    ED Course  Procedures (including critical care time)  Labs Reviewed  CBC WITH DIFFERENTIAL - Abnormal; Notable for the following:    WBC 15.1 (*)     Neutro Abs 11.6 (*)     All other components within normal limits  BASIC METABOLIC PANEL - Abnormal; Notable for the following:    Glucose, Bld 122 (*)     GFR calc non Af Amer 72 (*)     GFR calc Af Amer 84 (*)     All other components within normal limits  TYPE AND SCREEN  ABO/RH  PROTIME-INR  URINALYSIS, ROUTINE W REFLEX MICROSCOPIC   Dg Chest 1 View  08/09/2011  *RADIOLOGY REPORT*  Clinical Data: Preoperative  exam, left hip fracture  CHEST - 1 VIEW  Comparison: None.  Findings: Dual lead left pacemaker in place.  Heart size is mildly enlarged without overt edema.  Mild central vascular congestion. No pleural effusion.  The minimal bibasilar curvilinear scarring or atelectasis.  IMPRESSION: Mild cardiomegaly without overt edema or focal acute finding.  Original Report Authenticated By: Harrel Lemon, M.D.   Dg Hip Complete Left  08/09/2011  *RADIOLOGY REPORT*  Clinical Data: Hip pain, fall  LEFT HIP - COMPLETE 2+ VIEW  Comparison: None.  Findings: Mildly angulated acute left femoral neck fracture identified.  Left femoral head is properly located.  No evidence of previous right femoral neck screw fixation.  Mild degenerative changes at the symphysis pubis.  Normal visualized bowel gas pattern.  No displaced pelvic fracture.  IMPRESSION: Mildly angulated acute left femoral neck fracture.  Original Report Authenticated By: Harrel Lemon, M.D.     1. Fx humeral neck       MDM  Dr. Dierdre Highman has seen patient and spoken with family. Pre-operative labs drawn. Pt is on Coumadin therefore will need to be a medicine admit.   Dr. Charlann Boxer with Orthopedics has been consulted on the patient. Patient has been admitted to [facility].        Dorthula Matas, PA 08/10/11 [phone]

## 2011-08-11 ENCOUNTER — Encounter (HOSPITAL_COMMUNITY)
Admission: EM | Disposition: A | Discharge status: Skilled Nursing Facility | Payer: Self-pay | Source: Home / Self Care | Attending: Internal Medicine

## 2011-08-11 ENCOUNTER — Inpatient Hospital Stay (HOSPITAL_COMMUNITY): Payer: Non-veteran care

## 2011-08-11 ENCOUNTER — Encounter (HOSPITAL_COMMUNITY): Payer: Self-pay | Admitting: Anesthesiology

## 2011-08-11 ENCOUNTER — Inpatient Hospital Stay (HOSPITAL_COMMUNITY): Payer: Non-veteran care | Admitting: Anesthesiology

## 2011-08-11 DIAGNOSIS — N390 Urinary tract infection, site not specified: Secondary | ICD-10-CM

## 2011-08-11 DIAGNOSIS — Z7901 Long term (current) use of anticoagulants: Secondary | ICD-10-CM

## 2011-08-11 HISTORY — PX: HIP ARTHROPLASTY: SHX981

## 2011-08-11 LAB — CBC
HCT: 40.1 % (ref 36.0–46.0)
MCH: 30.7 pg (ref 26.0–34.0)
MCHC: 33.4 g/dL (ref 30.0–36.0)
MCV: 92 fL (ref 78.0–100.0)
RDW: 13.4 % (ref 11.5–15.5)

## 2011-08-11 LAB — CARDIAC PANEL(CRET KIN+CKTOT+MB+TROPI): Troponin I: <0.3 ng/mL (ref <0.30)

## 2011-08-11 LAB — BASIC METABOLIC PANEL
BUN: 12 mg/dL (ref 6–23)
CO2: 23 mEq/L (ref 19–32)
Chloride: 100 mEq/L (ref 96–112)
Creatinine, Ser: 0.69 mg/dL (ref 0.50–1.10)
GFR calc Af Amer: 86 mL/min — ABNORMAL LOW (ref >90)

## 2011-08-11 SURGERY — HEMIARTHROPLASTY, HIP, DIRECT ANTERIOR APPROACH, FOR FRACTURE
Anesthesia: General | Site: Hip | Laterality: Left | Wound class: Clean

## 2011-08-11 MED ORDER — ACETAMINOPHEN 325 MG PO TABS: 650.0000 mg | ORAL_TABLET | Freq: Four times a day (QID) | ORAL | Status: DC | PRN

## 2011-08-11 MED ORDER — MIDAZOLAM HCL 5 MG/5ML IJ SOLN
INTRAMUSCULAR | Status: DC | PRN
  Administered 2011-08-11: 1 mg via INTRAVENOUS

## 2011-08-11 MED ORDER — HYDROMORPHONE HCL PF 1 MG/ML IJ SOLN: 0.2500 mg | INTRAMUSCULAR | Status: DC | PRN

## 2011-08-11 MED ORDER — ETOMIDATE 2 MG/ML IV SOLN
INTRAVENOUS | Status: DC | PRN
  Administered 2011-08-11: 20 mg via INTRAVENOUS

## 2011-08-11 MED ORDER — CEFAZOLIN SODIUM-DEXTROSE 2-3 GM-% IV SOLR
INTRAVENOUS | Status: AC
  Filled 2011-08-11: qty 50

## 2011-08-11 MED ORDER — ONDANSETRON HCL 4 MG PO TABS: 4.0000 mg | ORAL_TABLET | Freq: Four times a day (QID) | ORAL | Status: DC | PRN

## 2011-08-11 MED ORDER — ACETAMINOPHEN 10 MG/ML IV SOLN
1000.0000 mg | Freq: Once | INTRAVENOUS | Status: AC | PRN
  Filled 2011-08-11: qty 100

## 2011-08-11 MED ORDER — ONDANSETRON HCL 4 MG/2ML IJ SOLN: 4.0000 mg | Freq: Once | INTRAMUSCULAR | Status: DC | PRN

## 2011-08-11 MED ORDER — LACTATED RINGERS IV SOLN
INTRAVENOUS | Status: DC | PRN
  Administered 2011-08-11: 17:00:00 via INTRAVENOUS

## 2011-08-11 MED ORDER — POTASSIUM CHLORIDE CRYS ER 20 MEQ PO TBCR
40.0000 meq | EXTENDED_RELEASE_TABLET | Freq: Once | ORAL | Status: AC
  Administered 2011-08-11: 40 meq via ORAL
  Filled 2011-08-11: qty 2

## 2011-08-11 MED ORDER — SODIUM CHLORIDE 0.9 % IV SOLN
INTRAVENOUS | Status: DC
  Administered 2011-08-11 – 2011-08-15 (×4): via INTRAVENOUS
  Filled 2011-08-11 (×9): qty 1000

## 2011-08-11 MED ORDER — HYDROCODONE-ACETAMINOPHEN 5-325 MG PO TABS
1.0000 | ORAL_TABLET | Freq: Four times a day (QID) | ORAL | Status: DC | PRN
  Administered 2011-08-12: 1 via ORAL
  Administered 2011-08-12: 2 via ORAL
  Administered 2011-08-13 – 2011-08-14 (×2): 1 via ORAL
  Filled 2011-08-11: qty 1
  Filled 2011-08-11: qty 2
  Filled 2011-08-11 (×3): qty 1
  Filled 2011-08-11: qty 2

## 2011-08-11 MED ORDER — ONDANSETRON HCL 4 MG/2ML IJ SOLN
INTRAMUSCULAR | Status: DC | PRN
  Administered 2011-08-11: 4 mg via INTRAVENOUS

## 2011-08-11 MED ORDER — ONDANSETRON HCL 4 MG/2ML IJ SOLN: 4.0000 mg | Freq: Four times a day (QID) | INTRAMUSCULAR | Status: DC | PRN

## 2011-08-11 MED ORDER — FERROUS SULFATE 325 (65 FE) MG PO TABS
325.0000 mg | ORAL_TABLET | Freq: Three times a day (TID) | ORAL | Status: DC
Start: 2011-08-12 — End: 2011-08-16
  Administered 2011-08-12 – 2011-08-16 (×13): 325 mg via ORAL
  Filled 2011-08-11 (×16): qty 1

## 2011-08-11 MED ORDER — METHOCARBAMOL 100 MG/ML IJ SOLN
500.0000 mg | Freq: Four times a day (QID) | INTRAMUSCULAR | Status: DC | PRN
  Filled 2011-08-11: qty 5

## 2011-08-11 MED ORDER — METOCLOPRAMIDE HCL 5 MG/ML IJ SOLN
5.0000 mg | Freq: Three times a day (TID) | INTRAMUSCULAR | Status: DC | PRN
  Filled 2011-08-11: qty 2

## 2011-08-11 MED ORDER — DOCUSATE SODIUM 100 MG PO CAPS
100.0000 mg | ORAL_CAPSULE | Freq: Two times a day (BID) | ORAL | Status: DC
  Administered 2011-08-11 – 2011-08-16 (×8): 100 mg via ORAL
  Filled 2011-08-11 (×11): qty 1

## 2011-08-11 MED ORDER — ENOXAPARIN SODIUM 40 MG/0.4ML ~~LOC~~ SOLN
40.0000 mg | SUBCUTANEOUS | Status: DC
  Administered 2011-08-12 – 2011-08-16 (×5): 40 mg via SUBCUTANEOUS
  Filled 2011-08-11 (×6): qty 0.4

## 2011-08-11 MED ORDER — PHENOL 1.4 % MT LIQD
1.0000 | OROMUCOSAL | Status: DC | PRN
  Filled 2011-08-11: qty 177

## 2011-08-11 MED ORDER — CEFAZOLIN SODIUM 1-5 GM-% IV SOLN
INTRAVENOUS | Status: DC | PRN
  Administered 2011-08-11: 2 g via INTRAVENOUS

## 2011-08-11 MED ORDER — CEFAZOLIN SODIUM-DEXTROSE 2-3 GM-% IV SOLR: 2.0000 g | Freq: Four times a day (QID) | INTRAVENOUS | Status: DC

## 2011-08-11 MED ORDER — METOCLOPRAMIDE HCL 5 MG PO TABS
5.0000 mg | ORAL_TABLET | Freq: Three times a day (TID) | ORAL | Status: DC | PRN
  Filled 2011-08-11: qty 2

## 2011-08-11 MED ORDER — ACETAMINOPHEN 650 MG RE SUPP: 650.0000 mg | Freq: Four times a day (QID) | RECTAL | Status: DC | PRN

## 2011-08-11 MED ORDER — SUCCINYLCHOLINE CHLORIDE 20 MG/ML IJ SOLN
INTRAMUSCULAR | Status: DC | PRN
  Administered 2011-08-11: 100 mg via INTRAVENOUS

## 2011-08-11 MED ORDER — POLYETHYLENE GLYCOL 3350 17 G PO PACK
17.0000 g | PACK | Freq: Every day | ORAL | Status: DC | PRN
  Filled 2011-08-11: qty 1

## 2011-08-11 MED ORDER — METHOCARBAMOL 500 MG PO TABS
500.0000 mg | ORAL_TABLET | Freq: Four times a day (QID) | ORAL | Status: DC | PRN
  Filled 2011-08-11: qty 1

## 2011-08-11 MED ORDER — SODIUM CHLORIDE 0.9 % IR SOLN
Status: DC | PRN
  Administered 2011-08-11: 3000 mL

## 2011-08-11 MED ORDER — FENTANYL CITRATE 0.05 MG/ML IJ SOLN
INTRAMUSCULAR | Status: DC | PRN
  Administered 2011-08-11 (×2): 50 ug via INTRAVENOUS
  Administered 2011-08-11: 100 ug via INTRAVENOUS
  Administered 2011-08-11: 50 ug via INTRAVENOUS

## 2011-08-11 MED ORDER — MENTHOL 3 MG MT LOZG
1.0000 | LOZENGE | OROMUCOSAL | Status: DC | PRN
  Filled 2011-08-11: qty 9

## 2011-08-11 SURGICAL SUPPLY — 47 items
BLADE SAW SAG 73X25 THK (BLADE) ×1
BLADE SAW SGTL 73X25 THK (BLADE) ×1 IMPLANT
BRUSH FEMORAL CANAL (MISCELLANEOUS) IMPLANT
CLOTH BEACON ORANGE TIMEOUT ST (SAFETY) ×2 IMPLANT
COVER SURGICAL LIGHT HANDLE (MISCELLANEOUS) ×2 IMPLANT
DRAPE INCISE IOBAN 85X60 (DRAPES) ×2 IMPLANT
DRAPE ORTHO SPLIT 77X108 STRL (DRAPES) ×2
DRAPE SURG ORHT 6 SPLT 77X108 (DRAPES) ×2 IMPLANT
DRAPE U-SHAPE 47X51 STRL (DRAPES) ×2 IMPLANT
DRSG MEPILEX BORDER 4X8 (GAUZE/BANDAGES/DRESSINGS) ×2 IMPLANT
DURAPREP 26ML APPLICATOR (WOUND CARE) ×2 IMPLANT
ELECT REM PT RETURN 9FT ADLT (ELECTROSURGICAL) ×2
ELECTRODE REM PT RTRN 9FT ADLT (ELECTROSURGICAL) ×1 IMPLANT
EVACUATOR 1/8 PVC DRAIN (DRAIN) ×2 IMPLANT
FACESHIELD LNG OPTICON STERILE (SAFETY) ×4 IMPLANT
GLOVE BIOGEL PI IND STRL 7.5 (GLOVE) ×1 IMPLANT
GLOVE BIOGEL PI IND STRL 8 (GLOVE) ×2 IMPLANT
GLOVE BIOGEL PI INDICATOR 7.5 (GLOVE) ×1
GLOVE BIOGEL PI INDICATOR 8 (GLOVE) ×2
GLOVE ORTHO TXT STRL SZ7.5 (GLOVE) ×2 IMPLANT
GLOVE SURG ORTHO 8.0 STRL STRW (GLOVE) ×2 IMPLANT
GOWN STRL NON-REIN LRG LVL3 (GOWN DISPOSABLE) ×6 IMPLANT
HANDPIECE INTERPULSE COAX TIP (DISPOSABLE)
IMMOBILIZER KNEE 22 UNIV (SOFTGOODS) ×2 IMPLANT
KIT BASIN OR (CUSTOM PROCEDURE TRAY) ×2 IMPLANT
KIT ROOM TURNOVER OR (KITS) ×2 IMPLANT
MANIFOLD NEPTUNE II (INSTRUMENTS) ×2 IMPLANT
NS IRRIG 1000ML POUR BTL (IV SOLUTION) ×2 IMPLANT
PACK TOTAL JOINT (CUSTOM PROCEDURE TRAY) ×2 IMPLANT
PAD ARMBOARD 7.5X6 YLW CONV (MISCELLANEOUS) ×4 IMPLANT
PRESSURIZER FEMORAL UNIV (MISCELLANEOUS) IMPLANT
SET HNDPC FAN SPRY TIP SCT (DISPOSABLE) IMPLANT
SPECIMEN JAR MEDIUM (MISCELLANEOUS) ×2 IMPLANT
SPONGE LAP 4X18 X RAY DECT (DISPOSABLE) ×4 IMPLANT
STAPLER VISISTAT (STAPLE) ×2 IMPLANT
STAPLER VISISTAT 35W (STAPLE) IMPLANT
STRIP CLOSURE SKIN 1/2X4 (GAUZE/BANDAGES/DRESSINGS) ×2 IMPLANT
SUT MNCRL AB 4-0 PS2 18 (SUTURE) IMPLANT
SUT VIC AB 1 CT1 27 (SUTURE) ×2
SUT VIC AB 1 CT1 27XBRD ANBCTR (SUTURE) ×2 IMPLANT
SUT VIC AB 2-0 CT1 27 (SUTURE) ×1
SUT VIC AB 2-0 CT1 TAPERPNT 27 (SUTURE) ×1 IMPLANT
TOWEL OR 17X24 6PK STRL BLUE (TOWEL DISPOSABLE) ×2 IMPLANT
TOWEL OR 17X26 10 PK STRL BLUE (TOWEL DISPOSABLE) ×2 IMPLANT
TOWER CARTRIDGE SMART MIX (DISPOSABLE) IMPLANT
TRAY FOLEY CATH 14FR (SET/KITS/TRAYS/PACK) IMPLANT
WATER STERILE IRR 1000ML POUR (IV SOLUTION) ×8 IMPLANT

## 2011-08-11 NOTE — Preoperative (Signed)
Beta Blockers   Reason not to administer Beta Blockers:Not Applicable 

## 2011-08-11 NOTE — Op Note (Signed)
NAMERemus Henson                ACCOUNT NO.:  [id number]   MEDICAL RECORD NO.: [id number]   LOCATION:  1435                         FACILITY:  [facility]   DATE OF BIRTH:  08-Sep-1918  PHYSICIAN:  Madlyn Frankel. Charlann Boxer, M.D.     DATE OF PROCEDURE:  08/11/2011                               OPERATIVE REPORT     PREOPERATIVE DIAGNOSIS:  Left displaced femoral neck fracture.   POSTOPERATIVE DIAGNOSIS:  Left displaced femoral neck fracture.   PROCEDURE:  Left hip hemiarthroplasty utilizing DePuy component, size 4 standard Summit Basic press fit stem with a 46 unipolar ball with a +5 adapter.   SURGEON:  Madlyn Frankel. Charlann Boxer, MD   ASSISTANT:  Lanney Gins, PA-C.   ANESTHESIA:  General.   SPECIMENS:  None.   DRAINS:  One medium Hemovac.   BLOOD LOSS:  About <100 cc.   COMPLICATIONS:  None.   INDICATION OF PROCEDURE:  [name] is a 78 year old female who lives independently.  She unfortunately had a fall at her residence.  She was admitted to the hospital after radiographs revealed a femoral neck fracture.  She was seen and evaluated and was scheduled for surgery for fixation.  The necessity of surgical repair was discussed with she and her family.  Consent was obtained after reviewing risks of infection, DVT, component failure, and need for revision surgery.   PROCEDURE IN DETAIL:  The patient was brought to the operative theater. Once adequate anesthesia, preoperative antibiotics, 2 g of Ancef administered, the patient was positioned into the right lateral decubitus position with the left side up.  The left lower extremity was then prepped and draped in sterile fashion.  A time-out was performed identifying the patient, planned procedure, and extremity.   A lateral incision was made off the proximal trochanter. Sharp dissection was carried down to the iliotibial band and gluteal fascia. The gluteal fascia was then incised for posterior approach.  The short external rotators  were taken down separate from the posterior capsule. An L capsulotomy was made preserving the posterior leaflet for later anatomic repair. Fracture site was identified and after removing comminuted segments of the posterior femoral neck, the femoral head was removed without difficulty and measured on the back table  using the sizing rings and determined to be 46 mm in diameter.   The proximal femur was then exposed.  Retractors placed.  I then drilled, opened the proximal femur.  Then I hand reamed once and  Irrigated the canal to try to prevent fat emboli.  I began broaching the femur with a 0 broach up to a size 4 broach with good medial and lateral metaphyseal fit without evidence of any torsion or movement.  A trial reduction was carried out with a standard neck and a +0 adapter with a 46 ball.  The hip reduced nicely.  The leg lengths appeared to be equal compared to the down leg.   The hip went through a range of motion without evidence of any subluxation or impingement.   Given these findings, the trial components removed.  The final 4 standard Summit Basic press fit  stem was opened.  After irrigating  the canal, the final stem was impacted and sat at the level where the broach was. Based on this and the trial reduction, a +5 adapter was opened and impacted in the 46mm unipolar ball onto a clean and dry trunnion.  The hip had been irrigated throughout the case and again at this point.  I re- Approximated the posterior capsule to the superior leaflet using a  #1 Vicryl.  The remainder of the wound was closed with #1 Vicryl in the iliotibial band and gluteal fascia, a  2-0 Vicryl in the sub-Q tissue and skin staples on the skin.  The hip was cleaned, dried, and dressed sterilely.   She was then brought to recovery room, extubated in stable condition, tolerating the procedure well.  Lanney Gins, PA-C was present and utilized as Geophysicist/field seismologist for the entire case from  Preoperative  positioning to management of the contralateral extremity and retractors to  General facilitation of the procedure.  He was also involved with primary wound closure.         Madlyn Frankel Charlann Boxer, M.D.

## 2011-08-11 NOTE — Anesthesia Postprocedure Evaluation (Signed)
  Anesthesia Post-op Note  Patient: [name]  Procedure(s) Performed: Procedure(s) (LRB): ARTHROPLASTY BIPOLAR HIP (Left)  Patient Location: PACU  Anesthesia Type: General  Level of Consciousness: awake, alert  and oriented  Airway and Oxygen Therapy: Patient Spontanous Breathing and Patient connected to nasal cannula oxygen  Post-op Pain: mild  Post-op Assessment: Post-op Vital signs reviewed and Patient's Cardiovascular Status Stable  Post-op Vital Signs: stable  Complications: No apparent anesthesia complications

## 2011-08-11 NOTE — Transfer of Care (Signed)
Immediate Anesthesia Transfer of Care Note  Patient: [name]  Procedure(s) Performed: Procedure(s) (LRB): ARTHROPLASTY BIPOLAR HIP (Left)  Patient Location: PACU  Anesthesia Type: General  Level of Consciousness: awake  Airway & Oxygen Therapy: Patient Spontanous Breathing and Patient connected to nasal cannula oxygen  Post-op Assessment: Report given to PACU RN and Post -op Vital signs reviewed and stable  Post vital signs: Reviewed and stable  Complications: No apparent anesthesia complications

## 2011-08-11 NOTE — Clinical Social Work Placement (Addendum)
     Clinical Social Work Department CLINICAL SOCIAL WORK PLACEMENT NOTE 08/16/2011  Patient:  [name], [name]  Account Number:  [id number] Admit date:  08/09/2011  Clinical Social Worker:  Margaree Mackintosh  Date/time:  08/11/2011 11:00 AM  Clinical Social Work is seeking post-discharge placement for this patient at the following level of care:   SKILLED NURSING   (*CSW will update this form in Epic as items are completed)   08/11/2011  Patient/family provided with [facility] Department of Clinical Social Works list of facilities offering this level of care within the geographic area requested by the patient (or if unable, by the patients family).  08/11/2011  Patient/family informed of their freedom to choose among providers that offer the needed level of care, that participate in Medicare, Medicaid or managed care program needed by the patient, have an available bed and are willing to accept the patient.  08/11/2011  Patient/family informed of [facility] ownership interest in [facility], as well as of the fact that they are under no obligation to receive care at this facility.  PASARR submitted to EDS on 08/11/2011 PASARR number received from EDS on 08/15/2011  FL2 transmitted to all facilities in geographic area requested by pt/family on  08/11/2011 FL2 transmitted to all facilities within larger geographic area on   Patient informed that his/her managed care company has contracts with or will negotiate with  certain facilities, including the following:     Patient/family informed of bed offers received:  08/15/2011 Patient chooses bed at [facility] at [facility] Physician recommends and patient chooses bed at    Patient to be transferred to [facility] on  08/16/2011 Patient to be transferred to facility by EMS  The following physician request were entered in Epic:   Additional Comments:

## 2011-08-11 NOTE — Progress Notes (Signed)
Called attending MD to clarify cardiology consult as pt is waiting for surgery pending cardiology sign off and no notes are documented under cardiology service. Advised that Dr. Sharyn Lull the cardiologist who saw the pt yesterday and is the person who needs to be contacted regarding surgical recommendations. Dr. Sharyn Lull notified and he is to come and see the pt within the hour. Ortho service updated.

## 2011-08-11 NOTE — Progress Notes (Signed)
PCP: [facility]  Brief HPI:  [name] is a 78 y.o. female with a past medical history of Pacemaker; A-fib; Hypertension; Hypothyroidism; and Hyperlipemia.  Presented with Fall on day of admission after tripping on a purse while at church. She denied any head injury. She has been able to ambulate well at baseline. 3 years ago she says she had a syncopal episode and her heart stopped and ever since she needed a pacemaker. Denied any chest pain or dyspnea on exertion. Stated she is not sure if she ever had an echo but probably had cardiac catheterization 3 years ago.   Past medical history:  Past Medical History  Diagnosis Date  . Pacemaker   . A-fib   . Hypertension   . Hypothyroidism   . Hyperlipemia     Consultants: Gaylord Shih Charlann Boxer)  Procedures: None so far  Subjective: Patient feels fine. Denies any pain this morning. Son is a bedside. No CP or SOB.  Objective: Vital signs in last 24 hours: Temp:  [97.7 F (36.5 C)] 97.7 F (36.5 C) (07/18 2029) Pulse Rate:  [62-134] 62  (07/18 2029) Resp:  [20-21] 20  (07/18 2029) BP: (112-122)/(65-67) 122/67 mmHg (07/18 2029) SpO2:  [98 %-99 %] 98 % (07/18 2029) Weight change:  Last BM Date: 08/09/11  Intake/Output from previous day: 07/18 0701 - 07/19 0700 In: 721.4 [P.O.:50; I.V.:671.4] Out: 350 [Urine:350] Intake/Output this shift: Total I/O In: 3 [I.V.:3] Out: -   Tele: Appears to be in SR at 60's. Rate became normal yesterday afternoon.  General appearance: alert, appears stated age, distracted and no distress Head: Normocephalic, without obvious abnormality, atraumatic Resp: clear to auscultation bilaterally Cardio: irregularly irregular, normal rate. Systolic murmur. No click, rub or gallop GI: soft, non-tender; bowel sounds normal; no masses,  no organomegaly Extremities: Left LE externally rotated. Pulses: 2+ and symmetric Skin: Skin color, texture, turgor normal. No rashes or lesions Neurologic: Slightly agitated.  Knew the place, date, month, year. No focal deficits.  Lab Results:  Basename 08/11/11 0131 08/10/11 0645  WBC 12.1* 15.2*  HGB 13.4 13.9  HCT 40.1 41.3  PLT 196 199   BMET  Basename 08/11/11 0131 08/10/11 0645  NA 133* 136  K 3.4* 3.7  CL 100 101  CO2 23 23  GLUCOSE 113* 120*  BUN 12 18  CREATININE 0.69 0.62  CALCIUM 8.4 9.0  ALT -- 13    Studies/Results: Dg Chest 1 View  08/09/2011  *RADIOLOGY REPORT*  Clinical Data: Preoperative exam, left hip fracture  CHEST - 1 VIEW  Comparison: None.  Findings: Dual lead left pacemaker in place.  Heart size is mildly enlarged without overt edema.  Mild central vascular congestion. No pleural effusion.  The minimal bibasilar curvilinear scarring or atelectasis.  IMPRESSION: Mild cardiomegaly without overt edema or focal acute finding.  Original Report Authenticated By: Harrel Lemon, M.D.   Dg Hip Complete Left  08/09/2011  *RADIOLOGY REPORT*  Clinical Data: Hip pain, fall  LEFT HIP - COMPLETE 2+ VIEW  Comparison: None.  Findings: Mildly angulated acute left femoral neck fracture identified.  Left femoral head is properly located.  No evidence of previous right femoral neck screw fixation.  Mild degenerative changes at the symphysis pubis.  Normal visualized bowel gas pattern.  No displaced pelvic fracture.  IMPRESSION: Mildly angulated acute left femoral neck fracture.  Original Report Authenticated By: Harrel Lemon, M.D.   ECHOCARDIOGRAM Study Conclusions  - Left ventricle: The cavity size was normal. There was  mild concentric hypertrophy. Systolic function was normal. The estimated ejection fraction was in the range of 50% to 55%. Wall motion was normal; there were no regional wall motion abnormalities. Doppler parameters are consistent with abnormal left ventricular relaxation (grade 1 diastolic dysfunction). - Aortic valve: Severely calcified annulus. Trileaflet; severely calcified leaflets. There was severe stenosis. Mild  regurgitation. Valve area: 0.84cm^2(VTI). Valve area: 0.93cm^2 (Vmax). - Atrial septum: No defect or patent foramen ovale was identified. - Pulmonary arteries: PA peak pressure: 54mm Hg (S). - Pericardium, extracardiac: A trivial pericardial effusion was identified. Features were not consistent with tamponade physiology.    Medications:  Scheduled:    . amiodarone  150 mg Intravenous Once  . cefTRIAXone (ROCEPHIN) IVPB 1 gram/50 mL D5W  1 g Intravenous Q24H  . diltiazem  10 mg Intravenous Once  . fentaNYL  50 mcg Intravenous Once  . metoprolol tartrate  25 mg Oral BID  . ondansetron  4 mg Intravenous Once  . sodium chloride  3 mL Intravenous Q12H   Continuous:    . sodium chloride 50 mL/hr at 08/10/11 0543  . amiodarone (NEXTERONE PREMIX) 360 mg/200 mL dextrose 1 mg/min (08/10/11 1657)   Followed by  . amiodarone (NEXTERONE PREMIX) 360 mg/200 mL dextrose 0.5 mg/min (08/10/11 1901)  . DISCONTD: diltiazem (CARDIZEM) infusion 5 mg/hr (08/10/11 1152)   FAO:ZHYQMVHQIONGE, acetaminophen, albuterol, morphine injection, ondansetron (ZOFRAN) IV, ondansetron  Assessment/Plan:  Principal Problem:  *Atrial fibrillation with rapid ventricular response Active Problems:  Hip fracture  UTI (lower urinary tract infection)  Abdominal distention  Atrial fibrillation  Murmur, cardiac  Chronic anticoagulation    Atrial Fibrillation Now appears to be in SR. Get EKG. On Amiodarone infusion per cardiology. Echo showed normal EF with diastolic dysfunction. Severe aortic stenosis was seen.  Left Hip fracture Await cardiology input regarding surgery with this finding of severe aortic stenosis. Await records regarding cardiac catherization.   Chronic Anticoagulation Patient is anticoagulated with history of atrial fibrillation. Will need to recheck INR. Warfarin has been discontinued for surgery.  UTI (lower urinary tract infection) Continue Ceftriaxone. Follow up urine  culture.  Questionable Abdominal distention Abdomen is soft and non tender. Monitor.  Code Status Full Code  DVT Prophylaxis INR was therapeutic. Will need to resume post operatively.  Discussed with Son and patient.   LOS: 2 days   [facility]  [facility] Pager [phone] 08/11/2011, 11:21 AM

## 2011-08-11 NOTE — Clinical Social Work Psychosocial (Signed)
     Clinical Social Work Department BRIEF PSYCHOSOCIAL ASSESSMENT 08/11/2011  Patient:  [name]     Account Number:  [id number]     Admit date:  08/09/2011  Clinical Social Worker:  Margaree Mackintosh  Date/Time:  08/11/2011 11:00 AM  Referred by:  Physician  Date Referred:  08/10/2011  Other Referral:   Interview type:  Patient Other interview type:   with son present.    PSYCHOSOCIAL DATA Living Status:  ALONE Admitted from facility:   Level of care:   Primary support name:  [name] Primary support relationship to patient:  CHILD, ADULT Degree of support available:   Adequate.    CURRENT CONCERNS Current Concerns  Post-Acute Placement   Other Concerns:    SOCIAL WORK ASSESSMENT / PLAN Clinical Social Worker recieved referral for potential SNF. CSW reviewed chart and met with pt and pt's son at bed side.  Pt currently appears groggy due to pain medication (per RN and Son).  CSW introduced self and explained role. CSW reviewed potential SNF at dc (PT/OT have not seen pt at this time).  Pt and Son agreeable and would prefer [facility].  Son to phoned [facility] and express interest. CSW to submit information.  CSW to continue to follow and assist as needed.   Assessment/plan status:  Information/Referral to [facility] Other assessment/ plan:   Information/referral to community resources:   SNF    PATIENTS/FAMILYS RESPONSE TO PLAN OF CARE: Pt currently pleasant though groggy.  Son and pt were pleasant and engaged in conversation.  Son and pt thanked CSW for intervention.

## 2011-08-11 NOTE — Progress Notes (Signed)
Subjective:  Patient denies any chest pain or shortness of breath states feels better. Converted to sinus rhythm and remains in sinus rhythm on IV amiodarone. 2-D echo showed severe aortic stenosis with mild LVH with good LV systolic function. Complains of left hip pain and leg pain. Tentatively scheduled for surgery today old records from [place] still not available. Spoke with son whose not sure whether she had [facility] in [facility]  Objective:  Vital Signs in the last 24 hours: Temp:  [97.7 F (36.5 C)-98.4 F (36.9 C)] 98.4 F (36.9 C) (07/19 1355) Pulse Rate:  [62] 62  (07/18 2029) Resp:  [18-20] 18  (07/19 1355) BP: (122-138)/(67-80) 138/80 mmHg (07/19 1355) SpO2:  [98 %] 98 % (07/19 1355)  Intake/Output from previous day: 07/18 0701 - 07/19 0700 In: 721.4 [P.O.:50; I.V.:671.4] Out: 350 [Urine:350] Intake/Output from this shift: Total I/O In: 3 [I.V.:3] Out: -   Physical Exam: Neck: no adenopathy, no carotid bruit, no JVD and supple, symmetrical, trachea midline Lungs: clear to auscultation bilaterally Heart: regular rate and rhythm, S1, S2 normal and 3/6 systolic ejection murmur and soft diastolic murmur at left lower sternal border noted Abdomen: soft, non-tender; bowel sounds normal; no masses,  no organomegaly Extremities: extremities normal, atraumatic, no cyanosis or edema  Lab Results:  Basename 08/11/11 0131 08/10/11 0645  WBC 12.1* 15.2*  HGB 13.4 13.9  PLT 196 199    Basename 08/11/11 0131 08/10/11 0645  NA 133* 136  K 3.4* 3.7  CL 100 101  CO2 23 23  GLUCOSE 113* 120*  BUN 12 18  CREATININE 0.69 0.62    Basename 08/11/11 0131 08/10/11 1743  TROPONINI <0.30 <0.30   Hepatic Function Panel  Basename 08/10/11 0645  PROT 6.5  ALBUMIN 3.6  AST 20  ALT 13  ALKPHOS 56  BILITOT 0.4  BILIDIR --  IBILI --   No results found for this basename: CHOL in the last 72 hours No results found for this basename: PROTIME in the last 72  hours  Imaging: Imaging results have been reviewed and Dg Chest 1 View  08/09/2011  *RADIOLOGY REPORT*  Clinical Data: Preoperative exam, left hip fracture  CHEST - 1 VIEW  Comparison: None.  Findings: Dual lead left pacemaker in place.  Heart size is mildly enlarged without overt edema.  Mild central vascular congestion. No pleural effusion.  The minimal bibasilar curvilinear scarring or atelectasis.  IMPRESSION: Mild cardiomegaly without overt edema or focal acute finding.  Original Report Authenticated By: Harrel Lemon, M.D.   Dg Hip Complete Left  08/09/2011  *RADIOLOGY REPORT*  Clinical Data: Hip pain, fall  LEFT HIP - COMPLETE 2+ VIEW  Comparison: None.  Findings: Mildly angulated acute left femoral neck fracture identified.  Left femoral head is properly located.  No evidence of previous right femoral neck screw fixation.  Mild degenerative changes at the symphysis pubis.  Normal visualized bowel gas pattern.  No displaced pelvic fracture.  IMPRESSION: Mildly angulated acute left femoral neck fracture.  Original Report Authenticated By: Harrel Lemon, M.D.    Cardiac Studies:  Assessment/Plan:  Severe aortic stenosis Status post Atrial fibrillation with rapid ventricular response  Status post fall/fracture left hip  Hypertension  probable tachybradycardia syndrome status post permanent pacemaker  Hypercholesteremia  Degenerative joint disease  UTI  Hypothyroidism  History of syncope in the past Plan Discussed with patient and her son at length regarding 2-D echo finding and severe aortic stenosis and other multiple risk  factors including her age and high risk for any surgical procedure patient and family decided to proceed and accept the risk of surgery  LOS: 2 days    [name] N 08/11/2011, 5:01 PM

## 2011-08-11 NOTE — Anesthesia Preprocedure Evaluation (Addendum)
Anesthesia Evaluation  Patient identified by MRN, date of birth, ID band Patient awake    Reviewed: Allergy & Precautions, H&P , NPO status , Patient's Chart, lab work & pertinent test results, reviewed documented beta blocker date and time   History of Anesthesia Complications Negative for: history of anesthetic complications  Airway Mallampati: II TM Distance: >3 FB Neck ROM: Limited    Dental  (+) Caps and Dental Advisory Given,    Pulmonary neg pulmonary ROS,    Pulmonary exam normal       Cardiovascular hypertension, Pt. on home beta blockers + dysrhythmias Atrial Fibrillation + pacemaker + Valvular Problems/Murmurs AS Rhythm:Regular Rate:Normal     Neuro/Psych negative neurological ROS  negative psych ROS   GI/Hepatic negative GI ROS, Neg liver ROS,   Endo/Other  Hypothyroidism   Renal/GU negative Renal ROS     Musculoskeletal negative musculoskeletal ROS (+)   Abdominal   Peds  Hematology negative hematology ROS (+)   Anesthesia Other Findings   Reproductive/Obstetrics negative OB ROS                         Anesthesia Physical Anesthesia Plan  ASA: III  Anesthesia Plan: General   Post-op Pain Management:    Induction: Intravenous  Airway Management Planned: Oral ETT  Additional Equipment: Arterial line  Intra-op Plan:   Post-operative Plan: Extubation in OR  Informed Consent:   Dental advisory given  Plan Discussed with: Anesthesiologist, CRNA and Surgeon  Anesthesia Plan Comments:         Anesthesia Quick Evaluation

## 2011-08-11 NOTE — Progress Notes (Signed)
Left femoral neck fracture, displaced Pain with movement, NV stable  Needs left hip hemiarthroplasty NPO  Awaiting final recs from cardiology regarding perioperative care Other wise on schedule for OR this pm approx 4pm

## 2011-08-11 NOTE — Progress Notes (Signed)
Pt left for OR.

## 2011-08-12 ENCOUNTER — Inpatient Hospital Stay (HOSPITAL_COMMUNITY): Payer: Non-veteran care

## 2011-08-12 DIAGNOSIS — I350 Nonrheumatic aortic (valve) stenosis: Secondary | ICD-10-CM | POA: Diagnosis present

## 2011-08-12 DIAGNOSIS — I959 Hypotension, unspecified: Secondary | ICD-10-CM | POA: Diagnosis not present

## 2011-08-12 LAB — BASIC METABOLIC PANEL
BUN: 10 mg/dL (ref 6–23)
Calcium: 8.1 mg/dL — ABNORMAL LOW (ref 8.4–10.5)
GFR calc Af Amer: 87 mL/min — ABNORMAL LOW (ref >90)
GFR calc non Af Amer: 75 mL/min — ABNORMAL LOW (ref >90)
Potassium: 3.7 mEq/L (ref 3.5–5.1)
Sodium: 135 mEq/L (ref 135–145)

## 2011-08-12 LAB — CBC
HCT: 36.3 % (ref 36.0–46.0)
MCH: 30.6 pg (ref 26.0–34.0)
MCHC: 33.3 g/dL (ref 30.0–36.0)
RDW: 13.3 % (ref 11.5–15.5)

## 2011-08-12 LAB — URINE CULTURE

## 2011-08-12 MED ORDER — SODIUM CHLORIDE 0.9 % IV BOLUS (SEPSIS)
250.0000 mL | Freq: Once | INTRAVENOUS | Status: AC
  Administered 2011-08-12: 250 mL via INTRAVENOUS

## 2011-08-12 MED ORDER — AMIODARONE LOAD VIA INFUSION
150.0000 mg | Freq: Once | INTRAVENOUS | Status: AC
  Administered 2011-08-12: 150 mg via INTRAVENOUS
  Filled 2011-08-12: qty 83.34

## 2011-08-12 MED ORDER — METOPROLOL TARTRATE 1 MG/ML IV SOLN
INTRAVENOUS | Status: AC
  Administered 2011-08-12: 2.5 mg via INTRAVENOUS
  Filled 2011-08-12: qty 5

## 2011-08-12 MED ORDER — METOPROLOL TARTRATE 1 MG/ML IV SOLN
2.5000 mg | INTRAVENOUS | Status: AC | PRN
  Administered 2011-08-12: 2.5 mg via INTRAVENOUS
  Filled 2011-08-12: qty 5

## 2011-08-12 MED ORDER — SODIUM CHLORIDE 0.9 % IV BOLUS (SEPSIS): 250.0000 mL | Freq: Once | INTRAVENOUS | Status: DC

## 2011-08-12 MED ORDER — WARFARIN SODIUM 5 MG PO TABS
5.0000 mg | ORAL_TABLET | Freq: Once | ORAL | Status: AC
  Administered 2011-08-12: 5 mg via ORAL
  Filled 2011-08-12: qty 1

## 2011-08-12 MED ORDER — METOPROLOL TARTRATE 1 MG/ML IV SOLN
2.5000 mg | INTRAVENOUS | Status: AC
  Administered 2011-08-12 (×2): 2.5 mg via INTRAVENOUS

## 2011-08-12 MED ORDER — DIGOXIN 0.25 MG/ML IJ SOLN
0.2500 mg | Freq: Once | INTRAMUSCULAR | Status: AC
  Administered 2011-08-12: 0.25 mg via INTRAVENOUS
  Filled 2011-08-12: qty 1

## 2011-08-12 MED ORDER — AMIODARONE IV BOLUS ONLY 150 MG/100ML: 150.0000 mg | Freq: Once | INTRAVENOUS | Status: DC

## 2011-08-12 MED ORDER — WARFARIN - PHARMACIST DOSING INPATIENT
Freq: Every day | Status: DC
  Administered 2011-08-12 – 2011-08-15 (×3)

## 2011-08-12 NOTE — Progress Notes (Signed)
TO for ok to leave foley in place was obtained at 0900 this am, however, order not placed until 1728. Computer system would not allow back timing to 0900.

## 2011-08-12 NOTE — Progress Notes (Signed)
Subjective:  Patient denies any chest pain or shortness of breath. Tolerated the left hip hemiarthroplasty yesterday. Patient had recurrent episodes of A. fib with RVR earlier this morning received IV amiodarone bolus and 2 doses of IV Lopressor with not significant improvement in her heart rate. Heart rate remains in 100 to 120s A. fib on the monitor. Patient did drop her blood pressure in 80s requiring a fluid challenge. Her blood pressure now and 90s patient asymptomatic, will digitalize her.  Objective:  Vital Signs in the last 24 hours: Temp:  [98.3 F (36.8 C)-98.8 F (37.1 C)] 98.8 F (37.1 C) (07/20 0628) Pulse Rate:  [67-134] 123  (07/20 1000) Resp:  [14-24] 14  (07/20 1154) BP: (80-182)/(50-88) 96/52 mmHg (07/20 1155) SpO2:  [91 %-100 %] 96 % (07/20 1154)  Intake/Output from previous day: 07/19 0701 - 07/20 0700 In: 803 [I.V.:803] Out: 525 [Urine:475; Blood:50] Intake/Output from this shift: Total I/O In: 240 [P.O.:240] Out: -   Physical Exam: Neck: no adenopathy, no carotid bruit, no JVD and supple, symmetrical, trachea midline Lungs: clear to auscultation bilaterally Heart: irregularly irregular rhythm, S1, S2 normal and Soft systolic murmur noted while in A. fib Abdomen: soft, non-tender; bowel sounds normal; no masses,  no organomegaly Extremities: extremities normal, atraumatic, no cyanosis or edema and Left hip surgical dressing dry no evidence of hematoma  Lab Results:  Basename 08/12/11 0505 08/11/11 0131  WBC 13.1* 12.1*  HGB 12.1 13.4  PLT 175 196    Basename 08/12/11 0505 08/11/11 0131  NA 135 133*  K 3.7 3.4*  CL 100 100  CO2 22 23  GLUCOSE 110* 113*  BUN 10 12  CREATININE 0.67 0.69    Basename 08/11/11 0131 08/10/11 1743  TROPONINI <0.30 <0.30   Hepatic Function Panel  Basename 08/10/11 0645  PROT 6.5  ALBUMIN 3.6  AST 20  ALT 13  ALKPHOS 56  BILITOT 0.4  BILIDIR --  IBILI --   No results found for this basename: CHOL in the last  72 hours No results found for this basename: PROTIME in the last 72 hours  Imaging: Imaging results have been reviewed and Dg Pelvis Portable  08/11/2011  *RADIOLOGY REPORT*  Clinical Data: Postop following hip surgery.  PORTABLE PELVIS  Comparison: Preoperative radiograph 08/09/2011.  Findings: Compared to the prior examination, there has been interval left hip hemiarthroplasty.  The femoral stem appears appropriately located, without obvious periprosthetic fracture. The prosthetic femoral head projects over the left acetabulum (location cannot be confirmed on these frontal projections).  There is a small amount of gas in the overlying soft tissues, and surgical staples lateral to the left hip joint.  Postoperative changes of ORIF in the right femoral neck with three cannulated screws are again noted.  IMPRESSION: 1.  Expected postoperative appearance of the left hip status post hemiarthroplasty, without immediate complicating features, as above.  Original Report Authenticated By: Florencia Reasons, M.D.    Cardiac Studies:  Assessment/Plan:  Status post left hip hemiarthroplasty postop day 1 Severe aortic stenosis  Recurrent Atrial fibrillation with rapid ventricular response  Status post fall/fracture left hip  Hypertension  probable tachybradycardia syndrome status post permanent pacemaker  Hypercholesteremia  Degenerative joint disease  UTI  Hypothyroidism  History of syncope in the past Plan Agree with IV dig Okay to start Coumadin today from cardiac point of view  LOS: 3 days    [name] N 08/12/2011, 12:13 PM

## 2011-08-12 NOTE — Progress Notes (Signed)
ANTICOAGULATION CONSULT NOTE - Initial Consult  Pharmacy Consult for Coumadin Indication: atrial fibrillation and VTE prophylaxis  No Known Allergies  Patient Measurements: Height: 5\' 2"  (157.5 cm) Weight: 125 lb 1.6 oz (56.745 kg) IBW/kg (Calculated) : 50.1   Vital Signs: Temp: 98.3 F (36.8 C) (07/20 1439) Temp src: Oral (07/20 0628) BP: 90/51 mmHg (07/20 1439) Pulse Rate: 102  (07/20 1439)  Labs:  Basename 08/12/11 0505 08/11/11 0131 08/10/11 1743 08/10/11 1413 08/10/11 0954 08/10/11 0645 08/10/11 0041  HGB 12.1 13.4 -- -- -- -- --  HCT 36.3 40.1 -- -- -- 41.3 --  PLT 175 196 -- -- -- 199 --  APTT -- -- -- -- -- -- --  LABPROT -- 18.7* -- 22.0* -- -- 23.8*  INR -- 1.53* -- 1.89* -- -- 2.09*  HEPARINUNFRC -- -- -- -- -- -- --  CREATININE 0.67 0.69 -- -- -- 0.62 --  CKTOTAL -- 180* 205* -- 104 -- --  CKMB -- 3.6 4.0 -- 3.4 -- --  TROPONINI -- <0.30 <0.30 -- <0.30 -- --    Estimated Creatinine Clearance: 37 ml/min (by C-G formula based on Cr of 0.67).   Medical History: Past Medical History  Diagnosis Date  . Pacemaker   . A-fib   . Hypertension   . Hypothyroidism   . Hyperlipemia     Medications:  Prescriptions prior to admission  Medication Sig Dispense Refill  . amLODipine (NORVASC) 5 MG tablet Take 5 mg by mouth daily.      . feeding supplement (BOOST HIGH PROTEIN) LIQD Take 1 Container by mouth every morning.      Marland Kitchen levothyroxine (SYNTHROID, LEVOTHROID) 100 MCG tablet Take 100 mcg by mouth daily.      . metoprolol tartrate (LOPRESSOR) 25 MG tablet Take 25 mg by mouth 2 (two) times daily.      . rosuvastatin (CRESTOR) 5 MG tablet Take 2.5 mg by mouth at bedtime.      . traZODone (DESYREL) 50 MG tablet Take 25 mg by mouth at bedtime.      Marland Kitchen warfarin (COUMADIN) 5 MG tablet Take 5 mg by mouth at bedtime. Pt takes 5mg  6 days per week. 1 day per week pt takes 2.5mg  on sunday        Assessment: [name] is s/p hip fracture repair yesterday. She was on  Coumadin PTA for afib. Admit INR was therapeutic, but this was reversed with 2.5mg  PO Vit K. Received orders to restart Coumadin, noted home regimen was 5mg  daily except on Sundays, tooko 2.5mg . Noted H/H and plts decreased post-OR, likely ABLA.  Would anticipate need for slightly higher Coumadin doses given Vit K reversal, but will proceed cautiously given new Amio infusion. Did not see any blood products administered. Per RN, patient does not have any overt bleeding.  Noted concurrent Lovenox 40mg  SQ ordered.  Goal of Therapy:  INR 2-3 Monitor platelets by anticoagulation protocol: Yes   Plan:  - Coumadin 5mg  PO x 1 today - Will check daily INR - Will plan to d/c Lovenox when INR >2  Sheridyn Canino K. Allena Katz, PharmD, BCPS.  Clinical Pharmacist Pager [phone]. 08/12/2011 4:31 PM

## 2011-08-12 NOTE — Progress Notes (Signed)
Upon reassessment, pt has become nonsensical with her thoughts and lethargic, but arouses easily. Her abd is slightly more distended than earlier today and is tender to palpation with positive BS in all quads. Pt HR is now 110 and BP is 92/60 manual check. She is shaking and her temp is 97.4. Lung sounds are clear. Urine is clear, amber. UOP for shift is 350. Attending MD notified. Son at bedside.

## 2011-08-12 NOTE — Evaluation (Addendum)
Physical Therapy Evaluation Patient Details Name: [name] MRN: [id number] DOB: [birth year] Today's Date: 08/12/2011 Time: 5784-6962 PT Time Calculation (min): 41 min  PT Assessment / Plan / Recommendation Clinical Impression  Patient s/p left hip hemiarthroplasty with decr mobility secondary to decr endurance, decr balance, low BP and lethargy.  Nursing aware of low BP.  Will need NHP with therapy prior to d/c home.      PT Assessment  Patient needs continued PT services    Follow Up Recommendations  Skilled nursing facility;Supervision/Assistance - 24 hour    Barriers to Discharge Decreased caregiver support      Equipment Recommendations  Defer to next venue    Recommendations for Other Services     Frequency 7X/week    Precautions / Restrictions Precautions Precautions: Fall;Posterior Hip Precaution Booklet Issued: Yes (comment) Precaution Comments: precautions posted on wall Restrictions LLE Weight Bearing: Partial weight bearing LLE Partial Weight Bearing Percentage or Pounds: 50%   Pertinent Vitals/Pain VS:  Sitting in chair after transfer as patient was diaphoretic with BP 88/56, 76 bpm; after 2 minutes 80/50 with HR 105 bpm, notified nursing; after 4 minutes 77/43, 79bpm; after 5 min 86/60 123 bpm.  Nursing arrived and gave patient a bolus and stayed with patient monitoring patient.  She stated that had been an issue and wanted to keep patient up in chair.  Patient has some pain.      Mobility  Bed Mobility Bed Mobility: Rolling Left;Left Sidelying to Sit;Sitting - Scoot to Edge of Bed Rolling Left: 1: +2 Total assist Rolling Left: Patient Percentage: 40% Left Sidelying to Sit: 1: +2 Total assist;HOB elevated Left Sidelying to Sit: Patient Percentage: 40% Sitting - Scoot to Edge of Bed: 1: +2 Total assist Sitting - Scoot to Edge of Bed: Patient Percentage: 50% Details for Bed Mobility Assistance: cues for technique and posterior hip precautions.  Patient had a  lot of difficulty with mobility to the EOB secondary to pain and lethargy.  Transfers Transfers: Sit to Stand;Stand to Sit;Squat Pivot Transfers Sit to Stand: 1: +2 Total assist;With upper extremity assist;From bed (unable to achieve full stand) Sit to Stand: Patient Percentage: 30% Stand to Sit: 1: +2 Total assist;With upper extremity assist;To bed Stand to Sit: Patient Percentage: 30% Squat Pivot Transfers: 1: +2 Total assist;With upper extremity assistance (used pad to assist ) Squat Pivot Transfers: Patient Percentage: 40% Details for Transfer Assistance: Patient could not achieve sit to stand therefore performed squat pivot transfer to recliner iwth +2 assist and cues and use of pad.  Patient needed assist to weight bear on right LE and keep weight off of left LE.   Ambulation/Gait Ambulation/Gait Assistance: Not tested (comment) Stairs: No Wheelchair Mobility Wheelchair Mobility: No         PT Diagnosis: Generalized weakness;Acute pain;Difficulty walking  PT Problem List: Decreased activity tolerance;Decreased balance;Decreased mobility;Decreased knowledge of use of DME;Decreased safety awareness;Decreased knowledge of precautions;Pain;Decreased cognition PT Treatment Interventions: DME instruction;Gait training;Functional mobility training;Therapeutic activities;Therapeutic exercise;Balance training;Patient/family education   PT Goals Acute Rehab PT Goals PT Goal Formulation: With patient Time For Goal Achievement: 08/26/11 Potential to Achieve Goals: Good Pt will go Supine/Side to Sit: with min assist;with rail PT Goal: Supine/Side to Sit - Progress: Goal set today Pt will Sit at Edge of Bed: with supervision;3-5 min;with bilateral upper extremity support PT Goal: Sit at Edge Of Bed - Progress: Goal set today Pt will go Sit to Stand: with min assist;with upper extremity assist PT Goal: Sit to Stand -  Progress: Goal set today Pt will Transfer Bed to Chair/Chair to Bed: with  mod assist PT Transfer Goal: Bed to Chair/Chair to Bed - Progress: Goal set today Pt will Ambulate: 16 - 50 feet;with min assist;with least restrictive assistive device PT Goal: Ambulate - Progress: Goal set today Pt will Perform Home Exercise Program: with supervision, verbal cues required/provided PT Goal: Perform Home Exercise Program - Progress: Goal set today  Visit Information  Last PT Received On: 08/12/11 Assistance Needed: +2    Subjective Data  Subjective: "I am going to get some therapy." Patient Stated Goal: To go home   Prior Functioning  Home Living Lives With: Alone Available Help at Discharge: Family Home Adaptive Equipment: Straight cane;Shower chair with back Additional Comments: Plan is to go get therapy at [facility] and patient did not want to answer questions. Prior Function Level of Independence: Independent with assistive device(s) Driving: No Vocation: Retired Musician: No difficulties    Cognition  Overall Cognitive Status: Impaired Area of Impairment: Attention;Memory;Following commands;Safety/judgement;Awareness of deficits Arousal/Alertness: Lethargic Orientation Level: Disoriented to;Place;Time;Situation Behavior During Session: Lethargic Current Attention Level: Focused Memory: Decreased recall of precautions Following Commands: Follows one step commands inconsistently Safety/Judgement: Decreased awareness of safety precautions;Decreased safety judgement for tasks assessed;Decreased awareness of need for assistance    Extremity/Trunk Assessment Right Upper Extremity Assessment RUE ROM/Strength/Tone: Deficits;Unable to fully assess RUE ROM/Strength/Tone Deficits: due to not consistently following commands Left Upper Extremity Assessment LUE ROM/Strength/Tone: Deficits;Unable to fully assess LUE ROM/Strength/Tone Deficits: due to not consistently following commands Right Lower Extremity Assessment RLE ROM/Strength/Tone:  Deficits;Unable to fully assess RLE ROM/Strength/Tone Deficits: due to not consistently following commands; appears grossly 2+/5 Left Lower Extremity Assessment LLE ROM/Strength/Tone: Deficits;Unable to fully assess LLE ROM/Strength/Tone Deficits: due to not consistently following commands; appears grossly 2+/5   Balance Static Sitting Balance Static Sitting - Balance Support: Bilateral upper extremity supported;Feet supported Static Sitting - Level of Assistance: 1: +1 Total assist Static Sitting - Comment/# of Minutes: Patient was unable to balance at EOB leaning posteriorly and to her right.  Sat about 4 minutes at EOB.  End of Session PT - End of Session Equipment Utilized During Treatment: Gait belt Activity Tolerance: Patient limited by fatigue;Patient limited by pain;Treatment limited secondary to medical complications (Comment) (limited by low BP) Patient left: in chair;with call bell/phone within reach Nurse Communication: Mobility status;Need for lift equipment;Weight bearing status;Precautions       INGOLD,Zaeden Lastinger 08/12/2011, 11:44 AM  [facility] [phone] (pager)

## 2011-08-12 NOTE — Progress Notes (Signed)
HR 105, pt denies complaints.  BP 98/66.  Will continue to monitor.

## 2011-08-12 NOTE — Progress Notes (Signed)
PCP: [facility]  Brief HPI:  [name] is a 78 y.o. female with a past medical history of Pacemaker; A-fib; Hypertension; Hypothyroidism; and Hyperlipemia.  Presented with Fall on day of admission after tripping on a purse while at church. She denied any head injury. She has been able to ambulate well at baseline. 3 years ago she says she had a syncopal episode and her heart stopped and ever since she needed a pacemaker. Denied any chest pain or dyspnea on exertion. Stated she is not sure if she ever had an echo but probably had cardiac catheterization 3 years ago.   Past medical history:  Past Medical History  Diagnosis Date  . Pacemaker   . A-fib   . Hypertension   . Hypothyroidism   . Hyperlipemia     Consultants: Gaylord Shih Charlann Boxer)  Procedures: None so far  Subjective: Patient denies any lightheadedness. No Cp or SOB. Denies any pain this morning. Son is at bedside. Patient is asking when she will be able to leave the hospital.  Objective: Vital signs in last 24 hours: Temp:  [98.3 F (36.8 C)-98.8 F (37.1 C)] 98.8 F (37.1 C) (07/20 0628) Pulse Rate:  [67-134] 75  (07/20 0944) Resp:  [14-24] 14  (07/20 0800) BP: (80-182)/(50-88) 80/50 mmHg (07/20 1100) SpO2:  [91 %-100 %] 93 % (07/20 0944) Weight change:  Last BM Date: 08/09/11  Intake/Output from previous day: 07/19 0701 - 07/20 0700 In: 803 [I.V.:803] Out: 525 [Urine:475; Blood:50] Intake/Output this shift: Total I/O In: 240 [P.O.:240] Out: -   Tele: Back in afib. Rate is between 100-130.  General appearance: alert, appears stated age, distracted and no distress, sitting up in chair Head: Normocephalic, without obvious abnormality, atraumatic Resp: clear to auscultation bilaterally Cardio: irregularly irregular, tachy. Systolic murmur. No click, rub or gallop GI: soft, non-tender; bowel sounds normal; no masses,  no organomegaly Extremities: Incision site shows no bruising. No obvious swelling. Pulses: 2+  and symmetric Skin: Skin color, texture, turgor normal. No rashes or lesions Neurologic: Alert and oriented. No focal deficits.  Lab Results:  Basename 08/12/11 0505 08/11/11 0131  WBC 13.1* 12.1*  HGB 12.1 13.4  HCT 36.3 40.1  PLT 175 196   BMET  Basename 08/12/11 0505 08/11/11 0131 08/10/11 0645  NA 135 133* --  K 3.7 3.4* --  CL 100 100 --  CO2 22 23 --  GLUCOSE 110* 113* --  BUN 10 12 --  CREATININE 0.67 0.69 --  CALCIUM 8.1* 8.4 --  ALT -- -- 13    Studies/Results: Dg Pelvis Portable  08/11/2011  *RADIOLOGY REPORT*  Clinical Data: Postop following hip surgery.  PORTABLE PELVIS  Comparison: Preoperative radiograph 08/09/2011.  Findings: Compared to the prior examination, there has been interval left hip hemiarthroplasty.  The femoral stem appears appropriately located, without obvious periprosthetic fracture. The prosthetic femoral head projects over the left acetabulum (location cannot be confirmed on these frontal projections).  There is a small amount of gas in the overlying soft tissues, and surgical staples lateral to the left hip joint.  Postoperative changes of ORIF in the right femoral neck with three cannulated screws are again noted.  IMPRESSION: 1.  Expected postoperative appearance of the left hip status post hemiarthroplasty, without immediate complicating features, as above.  Original Report Authenticated By: Florencia Reasons, M.D.   ECHOCARDIOGRAM Study Conclusions  - Left ventricle: The cavity size was normal. There was mild concentric hypertrophy. Systolic function was normal. The estimated ejection fraction was  in the range of 50% to 55%. Wall motion was normal; there were no regional wall motion abnormalities. Doppler parameters are consistent with abnormal left ventricular relaxation (grade 1 diastolic dysfunction). - Aortic valve: Severely calcified annulus. Trileaflet; severely calcified leaflets. There was severe stenosis. Mild regurgitation. Valve area:  0.84cm^2(VTI). Valve area: 0.93cm^2 (Vmax). - Atrial septum: No defect or patent foramen ovale was identified. - Pulmonary arteries: PA peak pressure: 54mm Hg (S). - Pericardium, extracardiac: A trivial pericardial effusion was identified. Features were not consistent with tamponade physiology.    Medications:  Scheduled:    . amiodarone  150 mg Intravenous Once  . amiodarone  150 mg Intravenous Once  . cefTRIAXone (ROCEPHIN) IVPB 1 gram/50 mL D5W  1 g Intravenous Q24H  . digoxin  0.25 mg Intravenous Once  . docusate sodium  100 mg Oral BID  . enoxaparin (LOVENOX) injection  40 mg Subcutaneous Q24H  . ferrous sulfate  325 mg Oral TID PC  . metoprolol  2.5 mg Intravenous STAT  . metoprolol tartrate  25 mg Oral BID  . ondansetron  4 mg Intravenous Once  . potassium chloride  40 mEq Oral Once  . sodium chloride  250 mL Intravenous Once  . sodium chloride  250 mL Intravenous Once  . sodium chloride  250 mL Intravenous Once  . DISCONTD: amiodarone  150 mg Intravenous Once  . DISCONTD:  ceFAZolin (ANCEF) IV  2 g Intravenous Q6H  . DISCONTD: fentaNYL  50 mcg Intravenous Once  . DISCONTD: sodium chloride  3 mL Intravenous Q12H   Continuous:    . amiodarone (NEXTERONE PREMIX) 360 mg/200 mL dextrose 0.5 mg/min (08/12/11 0938)  . sodium chloride 0.9 % 1,000 mL with potassium chloride 10 mEq infusion 50 mL/hr at 08/11/11 2235   ZOX:[id number], acetaminophen, acetaminophen, albuterol, HYDROcodone-acetaminophen, menthol-cetylpyridinium, methocarbamol (ROBAXIN) IV, methocarbamol, metoCLOPramide (REGLAN) injection, metoCLOPramide, metoprolol, ondansetron (ZOFRAN) IV, ondansetron, phenol, polyethylene glycol, DISCONTD: acetaminophen, DISCONTD: acetaminophen, DISCONTD:  HYDROmorphone (DILAUDID) injection, DISCONTD:  morphine injection, DISCONTD: ondansetron (ZOFRAN) IV DISCONTD: ondansetron (ZOFRAN) IV, DISCONTD: ondansetron, DISCONTD: sodium chloride irrigation  Assessment/Plan:  Principal  Problem:  *Atrial fibrillation with rapid ventricular response Active Problems:  Hip fracture  UTI (lower urinary tract infection)  Abdominal distention  Atrial fibrillation  Murmur, cardiac  Chronic anticoagulation  Hypotension    Atrial Fibrillation with RVR With multiple doses of Metoprolol her BP has dropped. She remains asymptomatic. RN told to place her back on bed. Discussed with Dr. Sharyn Lull. Will give one dose of Dig IV. She remains on Amiodarone infusion. Echo showed normal EF with diastolic dysfunction. Severe aortic stenosis was seen.  Hypotension, asymptomatic Fluid bolus. Place back in bed. Monitor closely.  Left Hip fracture Status post ORIF. Ortho following.  Chronic Anticoagulation Patient was anticoagulated for history of atrial fibrillation. Warfarin had been discontinued for surgery. Will resume tomorrow if ok with cardiology.  UTI (lower urinary tract infection) Continue Ceftriaxone. Follow up urine culture.  Questionable Abdominal distention Abdomen is soft and non tender. Monitor.  Code Status Full Code  DVT Prophylaxis Was on warfarin. Will resume tomorrow. On SCD's and Lovenox.   Discussed with Son and patient.   LOS: 3 days   [facility]  [facility] Pager [phone] 08/12/2011, 11:26 AM

## 2011-08-12 NOTE — Progress Notes (Signed)
   Called by RN as patient appeared confused and there was concern for abdominal distension.  Son was at bedside. Patient denies any pain. She states she is not confused.  BP: 90/60, HR: 110-130 on Tele. RR: 14, Sats: 97% Lungs: CTA with decreased air entry at bases. No crackles Heart: S1S2 irregular Abdo: Soft, Mildly distended, BS+, Non tender, No masses or organomegaly Neuro: Alert, Oriented to place and person. Confused about date/time. No cranial nerve deficits. Equal strength bilaterally.   Patient does seem disoriented but no neurological deficits are noted. I think this is multifactorial from hospital stay, acute illness, medications, UTI. I don't think there is any acute intracranial process. No history of falls.   Have discussed in detail with patient's son and with RN. Reassured them. Will continue close monitoring. At this time there is no reason to transfer to SDU as patient is hemodynamically stable.  [name] 6:09 PM

## 2011-08-12 NOTE — Progress Notes (Signed)
During PT/OT today, pt developed hypotension of 80/50 manually. Dr. Sharyn Lull notified and orders given. Attending MD also notified. Pt denies any dizziness, confusion or dyspnea. She states she feels better than she has since she has been here. She does, however, c/o L hip pain r/t recent surg.

## 2011-08-12 NOTE — Progress Notes (Signed)
Orthopedics Progress Note  Subjective: Pt c/o minimal pain to right hip today Having issues with fluctuating tachycardia currently  Objective:  Filed Vitals:   08/12/11 0803  BP: 91/69  Pulse: 122  Temp:   Resp:     General: Awake and alert  Musculoskeletal: right hip incision healing well, nv intact distally Neurovascularly intact  Lab Results  Component Value Date   WBC 13.1* 08/12/2011   HGB 12.1 08/12/2011   HCT 36.3 08/12/2011   MCV 91.9 08/12/2011   PLT 175 08/12/2011       Component Value Date/Time   NA 135 08/12/2011 0505   K 3.7 08/12/2011 0505   CL 100 08/12/2011 0505   CO2 22 08/12/2011 0505   GLUCOSE 110* 08/12/2011 0505   BUN 10 08/12/2011 0505   CREATININE 0.67 08/12/2011 0505   CALCIUM 8.1* 08/12/2011 0505   GFRNONAA 75* 08/12/2011 0505   GFRAA 87* 08/12/2011 0505    Lab Results  Component Value Date   INR 1.53* 08/11/2011   INR 1.89* 08/10/2011   INR 2.09* 08/10/2011    Assessment/Plan: POD # s/p Procedure(s):right ARTHROPLASTY BIPOLAR HIP  PT/OT as able with heart issues D/c planning per medical team Will continue to monitor her progress  Viviann Spare R. Ranell Patrick, MD 08/12/2011 8:09 AM

## 2011-08-12 NOTE — Progress Notes (Signed)
Pt sustaining HR 130s-140s, denies pain/complaints.  BP WNL.  Discussed with Dr Sharyn Lull, new orders received for Amiodarone bolus.  Will continue to monitor.

## 2011-08-12 NOTE — Progress Notes (Signed)
Attending advised to hold am dose of PO lopressor today.

## 2011-08-13 ENCOUNTER — Inpatient Hospital Stay (HOSPITAL_COMMUNITY): Payer: Non-veteran care

## 2011-08-13 LAB — CBC
HCT: 29.4 % — ABNORMAL LOW (ref 36.0–46.0)
HCT: 28.6 % — ABNORMAL LOW (ref 36.0–46.0)
Hemoglobin: 9.7 g/dL — ABNORMAL LOW (ref 12.0–15.0)
MCH: 30.9 pg (ref 26.0–34.0)
MCHC: 33 g/dL (ref 30.0–36.0)
MCV: 91.6 fL (ref 78.0–100.0)
MCV: 93.2 fL (ref 78.0–100.0)
Platelets: 176 10*3/uL (ref 150–400)
RBC: 3.07 MIL/uL — ABNORMAL LOW (ref 3.87–5.11)
RDW: 13.5 % (ref 11.5–15.5)
WBC: 10.5 10*3/uL (ref 4.0–10.5)

## 2011-08-13 LAB — BASIC METABOLIC PANEL
BUN: 13 mg/dL (ref 6–23)
Chloride: 100 mEq/L (ref 96–112)
Creatinine, Ser: 0.8 mg/dL (ref 0.50–1.10)
GFR calc Af Amer: 73 mL/min — ABNORMAL LOW (ref >90)
GFR calc non Af Amer: 63 mL/min — ABNORMAL LOW (ref >90)
Glucose, Bld: 94 mg/dL (ref 70–99)

## 2011-08-13 MED ORDER — WARFARIN SODIUM 5 MG PO TABS
5.0000 mg | ORAL_TABLET | Freq: Once | ORAL | Status: AC
  Filled 2011-08-13: qty 1

## 2011-08-13 MED ORDER — DIGOXIN 125 MCG PO TABS
0.1250 mg | ORAL_TABLET | Freq: Every day | ORAL | Status: DC
  Administered 2011-08-13 – 2011-08-16 (×4): 0.125 mg via ORAL
  Filled 2011-08-13 (×5): qty 1

## 2011-08-13 MED ORDER — AMIODARONE HCL 200 MG PO TABS
200.0000 mg | ORAL_TABLET | Freq: Two times a day (BID) | ORAL | Status: DC
  Administered 2011-08-13 – 2011-08-14 (×2): 200 mg via ORAL
  Filled 2011-08-13 (×4): qty 1

## 2011-08-13 MED ORDER — LEVOTHYROXINE SODIUM 100 MCG PO TABS
100.0000 ug | ORAL_TABLET | Freq: Every day | ORAL | Status: DC
  Administered 2011-08-13 – 2011-08-16 (×4): 100 ug via ORAL
  Filled 2011-08-13 (×7): qty 1

## 2011-08-13 NOTE — Progress Notes (Signed)
Physical Therapy Treatment Patient Details Name: [name] MRN: [id number] DOB: Jul 16, 1918 Today's Date: 08/13/2011 Time: 7829-5621 PT Time Calculation (min): 18 min  PT Assessment / Plan / Recommendation Comments on Treatment Session  Patient with decreased cognition and lethargy impacting her progress with mobility.  Agree with need for SNF at discharge for continued therapy.    Follow Up Recommendations  Skilled nursing facility    Barriers to Discharge        Equipment Recommendations  Defer to next venue    Recommendations for Other Services    Frequency 7X/week   Plan Discharge plan remains appropriate;Frequency remains appropriate    Precautions / Restrictions Precautions Precautions: Fall;Posterior Hip Precaution Comments: Reviewed precautions - patient unable to recall any Restrictions Weight Bearing Restrictions: Yes LLE Weight Bearing: Partial weight bearing LLE Partial Weight Bearing Percentage or Pounds: 50%       Mobility  Bed Mobility Bed Mobility: Supine to Sit Rolling Left: 1: +2 Total assist Rolling Left: Patient Percentage: 30% Sitting - Scoot to Edge of Bed: 1: +2 Total assist Sitting - Scoot to Edge of Bed: Patient Percentage: 30% Details for Bed Mobility Assistance: Verbal and tactile cues for technique.  Used bed pad to assist patient to sitting and EOB.  Patient with minimal participation. Transfers Transfers: Sit to Stand;Stand to Sit;Squat Pivot Transfers Sit to Stand: 1: +2 Total assist;With upper extremity assist;From bed Sit to Stand: Patient Percentage: 30% Stand to Sit: 1: +2 Total assist;With upper extremity assist;To bed Stand to Sit: Patient Percentage: 30% Squat Pivot Transfers: 1: +2 Total assist;With upper extremity assistance Squat Pivot Transfers: Patient Percentage: 40% Details for Transfer Assistance: Patient unable to fully stand.  Performed squat pivot transfer to recliner with +2 assist, with max assist to keep weight off  LLE. Ambulation/Gait Ambulation/Gait Assistance: Not tested (comment)      PT Goals Acute Rehab PT Goals PT Goal: Supine/Side to Sit - Progress: Progressing toward goal PT Goal: Sit at Edge Of Bed - Progress: Progressing toward goal PT Goal: Sit to Stand - Progress: Progressing toward goal PT Transfer Goal: Bed to Chair/Chair to Bed - Progress: Progressing toward goal  Visit Information  Last PT Received On: 08/13/11 Assistance Needed: +2    Subjective Data  Subjective: Verbalizations not making sense.  Repeated 2-word phrase "I am, I am..."   Cognition  Overall Cognitive Status: Impaired Area of Impairment: Attention;Memory;Following commands;Safety/judgement;Awareness of deficits Arousal/Alertness: Lethargic Orientation Level: Disoriented to;Place;Time;Situation Behavior During Session: Lethargic Current Attention Level: Focused Attention - Other Comments: Required repeated commands to remain on task Memory: Decreased recall of precautions Following Commands: Follows one step commands inconsistently Safety/Judgement: Decreased awareness of safety precautions;Decreased safety judgement for tasks assessed;Decreased awareness of need for assistance Awareness of Deficits: Unable to state why she is in hospital    Balance  Balance Balance Assessed: Yes Static Sitting Balance Static Sitting - Balance Support: Bilateral upper extremity supported;Feet supported Static Sitting - Level of Assistance: 1: +1 Total assist Static Sitting - Comment/# of Minutes: Patient unable to maintain upright sitting position without max physical assist and verbal cuing.  End of Session PT - End of Session Equipment Utilized During Treatment: Gait belt Activity Tolerance: Patient limited by fatigue;Patient limited by pain (Limited due to cognition) Patient left: in chair;with call bell/phone within reach Nurse Communication: Mobility status;Need for lift equipment   GP     Vena Austria 08/13/2011, 9:36 AM Durenda Hurt. Renaldo Fiddler, [facility] Acute Rehab Services Pager [phone]

## 2011-08-13 NOTE — Progress Notes (Signed)
ANTICOAGULATION CONSULT NOTE - Follow Up Consult  Pharmacy Consult for coumadin Indication: atrial fibrillation and VTE prophylaxis  No Known Allergies  Patient Measurements: Height: 5\' 2"  (157.5 cm) Weight: 125 lb 1.6 oz (56.745 kg) IBW/kg (Calculated) : 50.1    Vital Signs: Temp: 99 F (37.2 C) (07/21 0931) Temp src: Oral (07/21 0931) BP: 124/70 mmHg (07/21 0931) Pulse Rate: 71  (07/21 0931)  Labs:  Basename 08/13/11 0705 08/12/11 0505 08/11/11 0131 08/10/11 1743 08/10/11 1413  HGB 9.7* 12.1 -- -- --  HCT 29.4* 36.3 40.1 -- --  PLT 164 175 196 -- --  APTT -- -- -- -- --  LABPROT 16.0* -- 18.7* -- 22.0*  INR 1.25 -- 1.53* -- 1.89*  HEPARINUNFRC -- -- -- -- --  CREATININE 0.80 0.67 0.69 -- --  CKTOTAL -- -- 180* 205* --  CKMB -- -- 3.6 4.0 --  TROPONINI -- -- <0.30 <0.30 --    Estimated Creatinine Clearance: 37 ml/min (by C-G formula based on Cr of 0.8).  Assessment: Patient is a 78 y.o F on coumadin for afib and VTE px s/p hip repair.  INR is subtherapeutic today.  No bleeding noted.  Will monitor closely for drug-drug intxn since patient is also on amiodarone.  Goal of Therapy:  INR 2-3   Plan:  1) coumadin 5mg  PO x1 today  Eryanna Regal P 08/13/2011,1:54 PM

## 2011-08-13 NOTE — Progress Notes (Signed)
PCP: [facility]  Brief HPI:  [name] is a 78 y.o. female with a past medical history of Pacemaker; A-fib; Hypertension; Hypothyroidism; and Hyperlipemia.  Presented with Fall on day of admission after tripping on a purse while at church. She denied any head injury. She has been able to ambulate well at baseline. 3 years ago she says she had a syncopal episode and her heart stopped and ever since she needed a pacemaker. Denied any chest pain or dyspnea on exertion. Stated she is not sure if she ever had an echo but probably had cardiac catheterization 3 years ago.   Past medical history:  Past Medical History  Diagnosis Date  . Pacemaker   . A-fib   . Hypertension   . Hypothyroidism   . Hyperlipemia     Consultants: Gaylord Shih Charlann Boxer)  Procedures: None so far  Subjective: Patient denies any headaches. No Cp or SOB. Complains of pain in the left hip area. Son is at bedside.   Objective: Vital signs in last 24 hours: Temp:  [98.3 F (36.8 C)-100.2 F (37.9 C)] 99 F (37.2 C) (07/21 0931) Pulse Rate:  [66-114] 71  (07/21 0931) Resp:  [14-18] 18  (07/21 0931) BP: (80-124)/(50-74) 124/70 mmHg (07/21 0931) SpO2:  [90 %-98 %] 96 % (07/21 0931) Weight change:  Last BM Date: 08/12/11  Intake/Output from previous day: 07/20 0701 - 07/21 0700 In: 480 [P.O.:480] Out: 400 [Urine:400] Intake/Output this shift: Total I/O In: 240 [P.O.:240] Out: -   Tele: HR in 60-70 range  General appearance: alert, appears stated age, distracted and no distress, sitting up in chair Head: Normocephalic, without obvious abnormality, atraumatic Resp: clear to auscultation bilaterally Cardio: irregularly irregular, tachy. Systolic murmur. No click, rub or gallop GI: soft, non-tender; bowel sounds normal; no masses,  no organomegaly Extremities: Incision site shows some swelling with minimal bruising. Tender. Pulses: 2+ and symmetric Skin: Skin color, texture, turgor normal. No rashes or  lesions Neurologic: Alert, Oriented to place, person, month and date. Did not know the year. Sometimes has difficulty with speech. No focal motor deficits.  Lab Results:  [facility] 08/13/11 0705 08/12/11 0505  WBC 10.1 13.1*  HGB 9.7* 12.1  HCT 29.4* 36.3  PLT 164 175   BMET  Basename 08/13/11 0705 08/12/11 0505  NA 132* 135  K 3.7 3.7  CL 100 100  CO2 21 22  GLUCOSE 94 110*  BUN 13 10  CREATININE 0.80 0.67  CALCIUM 7.6* 8.1*  ALT -- --    Studies/Results: Dg Pelvis Portable  08/11/2011  *RADIOLOGY REPORT*  Clinical Data: Postop following hip surgery.  PORTABLE PELVIS  Comparison: Preoperative radiograph 08/09/2011.  Findings: Compared to the prior examination, there has been interval left hip hemiarthroplasty.  The femoral stem appears appropriately located, without obvious periprosthetic fracture. The prosthetic femoral head projects over the left acetabulum (location cannot be confirmed on these frontal projections).  There is a small amount of gas in the overlying soft tissues, and surgical staples lateral to the left hip joint.  Postoperative changes of ORIF in the right femoral neck with three cannulated screws are again noted.  IMPRESSION: 1.  Expected postoperative appearance of the left hip status post hemiarthroplasty, without immediate complicating features, as above.  Original Report Authenticated By: Florencia Reasons, M.D.   Dg Abd Portable 1v  08/12/2011  *RADIOLOGY REPORT*  Clinical Data: 78 year old female with abdominal pain and distention.  PORTABLE ABDOMEN - 1 VIEW  Comparison: Pelvis series 08/11/2011.  Findings:  AP portable supine views at 2037 hours.  Cardiac pacemaker leads.  Postoperative changes to both proximal femurs again noted. Nonobstructed bowel gas pattern.  Calcified atherosclerosis. No acute osseous abnormality identified.  No definite pneumoperitoneum.  IMPRESSION: Nonobstructed bowel gas pattern.  Original Report Authenticated By: Harley Hallmark,  M.D.   ECHOCARDIOGRAM Study Conclusions  - Left ventricle: The cavity size was normal. There was mild concentric hypertrophy. Systolic function was normal. The estimated ejection fraction was in the range of 50% to 55%. Wall motion was normal; there were no regional wall motion abnormalities. Doppler parameters are consistent with abnormal left ventricular relaxation (grade 1 diastolic dysfunction). - Aortic valve: Severely calcified annulus. Trileaflet; severely calcified leaflets. There was severe stenosis. Mild regurgitation. Valve area: 0.84cm^2(VTI). Valve area: 0.93cm^2 (Vmax). - Atrial septum: No defect or patent foramen ovale was identified. - Pulmonary arteries: PA peak pressure: 54mm Hg (S). - Pericardium, extracardiac: A trivial pericardial effusion was identified. Features were not consistent with tamponade physiology.    Medications:  Scheduled:    . cefTRIAXone (ROCEPHIN) IVPB 1 gram/50 mL D5W  1 g Intravenous Q24H  . digoxin  0.25 mg Intravenous Once  . digoxin  0.25 mg Intravenous Once  . docusate sodium  100 mg Oral BID  . enoxaparin (LOVENOX) injection  40 mg Subcutaneous Q24H  . ferrous sulfate  325 mg Oral TID PC  . levothyroxine  100 mcg Oral Daily  . metoprolol tartrate  25 mg Oral BID  . ondansetron  4 mg Intravenous Once  . sodium chloride  250 mL Intravenous Once  . sodium chloride  250 mL Intravenous Once  . sodium chloride  250 mL Intravenous Once  . sodium chloride  250 mL Intravenous Once  . warfarin  5 mg Oral ONCE-1800  . Warfarin - Pharmacist Dosing Inpatient   Does not apply q1800   Continuous:    . amiodarone (NEXTERONE PREMIX) 360 mg/200 mL dextrose 0.5 mg/min (08/13/11 0518)  . sodium chloride 0.9 % 1,000 mL with potassium chloride 10 mEq infusion 50 mL/hr at 08/13/11 1914   NWG:NFAOZHYQMVHQI, acetaminophen, albuterol, HYDROcodone-acetaminophen, menthol-cetylpyridinium, methocarbamol (ROBAXIN) IV, methocarbamol, metoCLOPramide (REGLAN)  injection, metoCLOPramide, ondansetron (ZOFRAN) IV, ondansetron, phenol, polyethylene glycol  Assessment/Plan:  Principal Problem:  *Atrial fibrillation with rapid ventricular response Active Problems:  Hip fracture  UTI (lower urinary tract infection)  Abdominal distention  Atrial fibrillation  Murmur, cardiac  Chronic anticoagulation  Hypotension  Severe aortic stenosis    Atrial Fibrillation with RVR/H/o Pacemaker HR finally under control after she was given Digoxin. Will start low dose daily dig. She remains on Amiodarone infusion. Echo showed normal EF with diastolic dysfunction. Severe aortic stenosis was seen. Await further input from cardiology.  Acute Encephalopathy Possibly from hospital stay and surgery/acute illness, UTI. Since she did fall will get Ct head. But denies hitting her head and denies headaches. CVA is also possible though no focal deficits are noted. Cannot get MRI due to pacemaker  Hypotension, asymptomatic Resolved with control of HR. Monitor closely.  Left Hip fracture Status post ORIF. Ortho following.  Anemia likely due to acute blood loss Pain has some swelling over left thigh area. Probably from Post surgical bleeding. Hgb did drop. Will continue to monitor.  Chronic Anticoagulation Patient was anticoagulated for history of atrial fibrillation. Warfarin had been discontinued for surgery. Has been resumed. Caution due to anemia.  UTI (lower urinary tract infection) Continue Ceftriaxone. Follow up urine culture.  Questionable Abdominal distention Abdomen is soft and non  tender. AXR was unremarkable.  Code Status Full Code  DVT Prophylaxis On SCD's and Lovenox.   Disposition Will need SNF on discharge.  Discussed with Son and patient.   LOS: 4 days   [facility]  [facility] Pager [phone] 08/13/2011, 10:20 AM

## 2011-08-13 NOTE — Progress Notes (Signed)
Subjective:  Patient denies any chest pain or shortness of breath feels much better today. Converted back to sinus rhythm yesterday evening and remains in sinus rhythm. Had episode of confusion earlier had CT of the brain which is negative for acute stroke  Objective:  Vital Signs in the last 24 hours: Temp:  [98.3 F (36.8 C)-100.2 F (37.9 C)] 99 F (37.2 C) (07/21 0931) Pulse Rate:  [66-114] 71  (07/21 0931) Resp:  [14-18] 18  (07/21 0931) BP: (90-124)/(51-74) 124/70 mmHg (07/21 0931) SpO2:  [90 %-98 %] 96 % (07/21 0931)  Intake/Output from previous day: 07/20 0701 - 07/21 0700 In: 480 [P.O.:480] Out: 400 [Urine:400] Intake/Output from this shift: Total I/O In: 240 [P.O.:240] Out: -   Physical Exam: Neck: no adenopathy, no carotid bruit, no JVD and supple, symmetrical, trachea midline Lungs: clear to auscultation bilaterally Heart: regular rate and rhythm, S1, S2 normal and 3/6 systolic murmur and soft diastolic murmur noted Abdomen: soft, non-tender; bowel sounds normal; no masses,  no organomegaly Extremities: extremities normal, atraumatic, no cyanosis or edema and Left hip surgical dressing dry and soft  Lab Results:  Basename 08/13/11 0705 08/12/11 0505  WBC 10.1 13.1*  HGB 9.7* 12.1  PLT 164 175    Basename 08/13/11 0705 08/12/11 0505  NA 132* 135  K 3.7 3.7  CL 100 100  CO2 21 22  GLUCOSE 94 110*  BUN 13 10  CREATININE 0.80 0.67    Basename 08/11/11 0131 08/10/11 1743  TROPONINI <0.30 <0.30   Hepatic Function Panel No results found for this basename: PROT,ALBUMIN,AST,ALT,ALKPHOS,BILITOT,BILIDIR,IBILI in the last 72 hours No results found for this basename: CHOL in the last 72 hours No results found for this basename: PROTIME in the last 72 hours  Imaging: Imaging results have been reviewed and Ct Head Wo Contrast  08/13/2011  *RADIOLOGY REPORT*  Clinical Data: Altered mental status.  CT HEAD WITHOUT CONTRAST  Technique:  Contiguous axial images were  obtained from the base of the skull through the vertex without contrast.  Comparison: None.  Findings: Bony calvarium appears intact.  Mild diffuse cortical atrophy is noted.  Chronic ischemic white matter disease is noted. No mass effect or midline shift is noted.  Ventricular size is within normal limits.  There is no evidence of mass, hemorrhage or acute infarction.  IMPRESSION: Mild diffuse cortical atrophy.  Chronic ischemic white matter disease. No acute intracranial abnormality seen.  Original Report Authenticated By: Venita Sheffield., M.D.   Dg Pelvis Portable  08/11/2011  *RADIOLOGY REPORT*  Clinical Data: Postop following hip surgery.  PORTABLE PELVIS  Comparison: Preoperative radiograph 08/09/2011.  Findings: Compared to the prior examination, there has been interval left hip hemiarthroplasty.  The femoral stem appears appropriately located, without obvious periprosthetic fracture. The prosthetic femoral head projects over the left acetabulum (location cannot be confirmed on these frontal projections).  There is a small amount of gas in the overlying soft tissues, and surgical staples lateral to the left hip joint.  Postoperative changes of ORIF in the right femoral neck with three cannulated screws are again noted.  IMPRESSION: 1.  Expected postoperative appearance of the left hip status post hemiarthroplasty, without immediate complicating features, as above.  Original Report Authenticated By: Florencia Reasons, M.D.   Dg Abd Portable 1v  08/12/2011  *RADIOLOGY REPORT*  Clinical Data: 78 year old female with abdominal pain and distention.  PORTABLE ABDOMEN - 1 VIEW  Comparison: Pelvis series 08/11/2011.  Findings: AP portable supine views at  2037 hours.  Cardiac pacemaker leads.  Postoperative changes to both proximal femurs again noted. Nonobstructed bowel gas pattern.  Calcified atherosclerosis. No acute osseous abnormality identified.  No definite pneumoperitoneum.  IMPRESSION: Nonobstructed  bowel gas pattern.  Original Report Authenticated By: Harley Hallmark, M.D.    Cardiac Studies:  Assessment/Plan:  Status post left hip hemiarthroplasty postop day 1  Severe aortic stenosis  Recurrent Atrial fibrillation with rapid ventricular response  Status post fall/fracture left hip  Hypertension  probable tachybradycardia syndrome status post permanent pacemaker  Hypercholesteremia  Degenerative joint disease  UTI  Hypothyroidism  History of syncope in the past Postop anemia probably secondary to hydration yesterday Plan Change IV amiodarone to by mouth Monitor CBC and INR I will sign off please call if needed  LOS: 4 days    Schawn Byas N 08/13/2011, 12:26 PM

## 2011-08-14 ENCOUNTER — Encounter (HOSPITAL_COMMUNITY): Payer: Self-pay | Admitting: Orthopedic Surgery

## 2011-08-14 LAB — BASIC METABOLIC PANEL
BUN: 13 mg/dL (ref 6–23)
CO2: 25 mEq/L (ref 19–32)
Calcium: 7.9 mg/dL — ABNORMAL LOW (ref 8.4–10.5)
Chloride: 102 mEq/L (ref 96–112)
Creatinine, Ser: 0.73 mg/dL (ref 0.50–1.10)
Glucose, Bld: 96 mg/dL (ref 70–99)

## 2011-08-14 LAB — CBC
HCT: 28.7 % — ABNORMAL LOW (ref 36.0–46.0)
MCH: 31.7 pg (ref 26.0–34.0)
MCV: 92 fL (ref 78.0–100.0)
Platelets: 181 10*3/uL (ref 150–400)
RBC: 3.12 MIL/uL — ABNORMAL LOW (ref 3.87–5.11)
WBC: 8.5 10*3/uL (ref 4.0–10.5)

## 2011-08-14 MED ORDER — TRAMADOL-ACETAMINOPHEN 37.5-325 MG PO TABS
1.0000 | ORAL_TABLET | Freq: Four times a day (QID) | ORAL | Status: DC | PRN
  Filled 2011-08-14 (×2): qty 1

## 2011-08-14 MED ORDER — AMIODARONE HCL 200 MG PO TABS
400.0000 mg | ORAL_TABLET | Freq: Two times a day (BID) | ORAL | Status: DC
  Filled 2011-08-14 (×2): qty 2

## 2011-08-14 MED ORDER — WARFARIN SODIUM 6 MG PO TABS
6.0000 mg | ORAL_TABLET | Freq: Once | ORAL | Status: AC
  Administered 2011-08-14: 6 mg via ORAL
  Filled 2011-08-14: qty 1

## 2011-08-14 MED ORDER — METOPROLOL TARTRATE 1 MG/ML IV SOLN
5.0000 mg | Freq: Once | INTRAVENOUS | Status: AC
  Administered 2011-08-14: 5 mg via INTRAVENOUS
  Filled 2011-08-14: qty 5

## 2011-08-14 MED ORDER — AMIODARONE HCL 200 MG PO TABS
200.0000 mg | ORAL_TABLET | ORAL | Status: AC
  Administered 2011-08-14: 200 mg via ORAL
  Filled 2011-08-14: qty 1

## 2011-08-14 MED ORDER — AMIODARONE HCL 200 MG PO TABS
400.0000 mg | ORAL_TABLET | Freq: Two times a day (BID) | ORAL | Status: DC
  Administered 2011-08-14 – 2011-08-16 (×4): 400 mg via ORAL
  Filled 2011-08-14 (×5): qty 2

## 2011-08-14 NOTE — Progress Notes (Signed)
ANTICOAGULATION CONSULT NOTE - Follow Up Consult  Pharmacy Consult for coumadin Indication: atrial fibrillation and VTE prophylaxis  No Known Allergies  Labs:  Basename 08/14/11 0518 08/13/11 1633 08/13/11 0705 08/12/11 0505  HGB 9.9* 9.5* -- --  HCT 28.7* 28.6* 29.4* --  PLT 181 176 164 --  APTT -- -- -- --  LABPROT 15.3* -- 16.0* --  INR 1.18 -- 1.25 --  HEPARINUNFRC -- -- -- --  CREATININE 0.73 -- 0.80 0.67  CKTOTAL -- -- -- --  CKMB -- -- -- --  TROPONINI -- -- -- --    Estimated Creatinine Clearance: 37 ml/min (by C-G formula based on Cr of 0.73).  Assessment: Patient is a 78 y.o F on coumadin for afib and VTE px s/p hip repair.  INR is subtherapeutic today.  No bleeding noted.  Will monitor closely for drug-drug intxn since patient is also on amiodarone.  Goal of Therapy:  INR 2-3   Plan:  1) Coumadin 6mg  PO x1 today 2) Follow up AM INR  Thank you. Okey Regal, PharmD [phone]  08/14/2011,9:02 AM

## 2011-08-14 NOTE — Progress Notes (Addendum)
Clinical Social Worker continuing to follow for discharge planning. Clinical Social Worker received phone call from pt son requesting follow up about SNF placement. Clinical Social Worker met with pt and pt son at bedside to discuss. Pt and pt son interested in [facility]. Clinical Social Worker contacted facility and spoke with admissions coordinator and facility able to offer a bed and will contact pt and pt son to discuss facilities questions in regard to insurance. Clinical Social Worker followed up with pt and pt son after facility had contacted pt and pt family and confirmed plan for [facility] at discharge. Per chart, pt not yet medically stable for discharge as pt HR remains an issue. Clinical Social Worker to continue to follow and facilitate pt discharge needs when pt medically stable for discharge.  Jacklynn Lewis, MSW, LCSWA (coverage for Reece Levy [phone])  Clinical Social Work

## 2011-08-14 NOTE — Progress Notes (Signed)
Physical Therapy Treatment Patient Details Name: [name] MRN: [id number] DOB: 01/28/1918 Today's Date: 08/14/2011 Time: 4098-1191 PT Time Calculation (min): 14 min  PT Assessment / Plan / Recommendation Comments on Treatment Session  Unable to complete supine to sit transfer. Initiated transition to side of bed when RN reported pt in A-fib. HR in mid 130s. Discontinued session at request of nursing. RN paged MD who arrived to evaluate pt.     Follow Up Recommendations  Skilled nursing facility    Barriers to Discharge        Equipment Recommendations  Defer to next venue    Recommendations for Other Services    Frequency 7X/week   Plan Discharge plan remains appropriate;Frequency remains appropriate    Precautions / Restrictions Precautions Precautions: Fall;Posterior Hip Precaution Comments: Reviewed precautions - patient unable to recall any Restrictions Weight Bearing Restrictions: Yes LLE Weight Bearing: Partial weight bearing LLE Partial Weight Bearing Percentage or Pounds: 50%   Pertinent Vitals/Pain Pt c/o L hip pain. Unable to rate.      Mobility  Bed Mobility Bed Mobility: Supine to Sit Rolling Left: 1: +2 Total assist Rolling Left: Patient Percentage: 20% Details for Bed Mobility Assistance: Unable to complete supine to sit transfer. Initiated transition to side of bed when RN reported pt in A-fib.  HR in mid 130s.  Discontinued session at request of nursing. RN paged MD who arrived to evaluate pt.   Transfers Transfers: Not assessed Sit to Stand: Not tested (comment) Ambulation/Gait Ambulation/Gait Assistance: Not tested (comment)    Exercises Total Joint Exercises Ankle Circles/Pumps: 10 reps;Both Heel Slides: 5 reps;Left;Supine   PT Diagnosis:    PT Problem List:   PT Treatment Interventions:     PT Goals Acute Rehab PT Goals PT Goal Formulation: With patient Time For Goal Achievement: 08/26/11 Potential to Achieve Goals: Good Pt will go  Supine/Side to Sit: with min assist;with rail PT Goal: Supine/Side to Sit - Progress: Not progressing Pt will Sit at Edge of Bed: with supervision;3-5 min;with bilateral upper extremity support PT Goal: Sit at Edge Of Bed - Progress: Not progressing Pt will go Sit to Stand: with min assist;with upper extremity assist PT Goal: Sit to Stand - Progress: Not progressing Pt will Transfer Bed to Chair/Chair to Bed: with mod assist  Visit Information  Last PT Received On: 08/14/11 Assistance Needed: +2    Subjective Data      Cognition  Overall Cognitive Status: Impaired Area of Impairment: Attention;Memory;Following commands;Safety/judgement;Awareness of deficits Arousal/Alertness: Lethargic Orientation Level: Disoriented to;Place;Time;Situation Behavior During Session: Lethargic Following Commands: Follows one step commands inconsistently    Balance  Balance Balance Assessed: No  End of Session PT - End of Session Activity Tolerance: Treatment limited secondary to medical complications (Comment) Patient left: in bed;with call bell/phone within reach;with nursing in room;Other (comment) (MD in room with pt. )   GP     Alferd Apa 08/14/2011, 1:38 PM Tanee Henery L. Daphnee Preiss DPT [phone]

## 2011-08-14 NOTE — Progress Notes (Signed)
Patient ID: [name], female   DOB: 1918-08-09, 78 y.o.   MRN: [id number] Subjective: 3 Days Post-Op Procedure(s) (LRB): ARTHROPLASTY BIPOLAR HIP (Left)    Patient reports pain as mild.  Just speaks of being cold this am when first waking up. A little sluggish (?vicodin)  Objective:   VITALS:   Filed Vitals:   08/14/11 0535  BP: 109/79  Pulse: 76  Temp: 98.1 F (36.7 C)  Resp: 18    Neurovascular intact Incision: scant drainage  LABS  Basename 08/14/11 0518 08/13/11 1633 08/13/11 0705  HGB 9.9* 9.5* 9.7*  HCT 28.7* 28.6* 29.4*  WBC 8.5 10.5 10.1  PLT 181 176 164     Basename 08/14/11 0518 08/13/11 0705 08/12/11 0505  NA 134* 132* 135  K 4.3 3.7 3.7  BUN 13 13 10   CREATININE 0.73 0.80 0.67  GLUCOSE 96 94 110*     Basename 08/14/11 0518 08/13/11 0705  LABPT -- --  INR 1.18 1.25     Assessment/Plan: 3 Days Post-Op Procedure(s) (LRB): ARTHROPLASTY BIPOLAR HIP (Left)   Advance diet Up with therapy Discharge to SNF when ready  Change pain meds to tramadol Daily dressing changes as needed Up with therapy 50% WB, LLE  DVT prophylaxis, currently on coumadin Return to office in 2 weeks to see [facility], [phone] May shower at any time, just keep wound as dry as possible until staples out

## 2011-08-14 NOTE — Progress Notes (Signed)
PCP: [facility]  Brief HPI:  [name] is a 78 y.o. female with a past medical history of Pacemaker; A-fib; Hypertension; Hypothyroidism; and Hyperlipemia.  Presented with Fall on day of admission after tripping on a purse while at church. She denied any head injury. She has been able to ambulate well at baseline. 3 years ago she says she had a syncopal episode and her heart stopped and ever since she needed a pacemaker. Denied any chest pain or dyspnea on exertion. Stated she is not sure if she ever had an echo but probably had cardiac catheterization 3 years ago.   Past medical history:  Past Medical History  Diagnosis Date  . Pacemaker   . A-fib   . Hypertension   . Hypothyroidism   . Hyperlipemia     Consultants: Gaylord Shih Charlann Boxer)  Procedures: None so far  Subjective: Patient denies any chest pain or shortness of breath. No lightheadedness. No significant pain in the left hip area.   Objective: Vital signs in last 24 hours: Temp:  [98.1 F (36.7 C)-98.8 F (37.1 C)] 98.1 F (36.7 C) (07/22 0535) Pulse Rate:  [73-76] 76  (07/22 0535) Resp:  [18] 18  (07/22 0800) BP: (109-128)/(71-79) 128/71 mmHg (07/22 0931) SpO2:  [94 %-98 %] 95 % (07/22 0800) Weight change:  Last BM Date: 08/13/11  Intake/Output from previous day: 07/21 0701 - 07/22 0700 In: 480 [P.O.:480] Out: 901 [Urine:900; Stool:1] Intake/Output this shift:    Tele: HR 120-140 since this morning.  General appearance: alert, appears stated age, distracted and no distress, sitting up in chair Head: Normocephalic, without obvious abnormality, atraumatic Resp: clear to auscultation bilaterally Cardio: irregularly irregular, tachy. Systolic murmur. No click, rub or gallop GI: soft, non-tender; bowel sounds normal; no masses,  no organomegaly Extremities: Incision site shows some swelling with minimal bruising. Tender. Pulses: 2+ and symmetric Skin: Skin color, texture, turgor normal. No rashes or  lesions Neurologic: Alert, Oriented to place, person, month and date and year. No focal motor deficits. Seems more oriented today.  Lab Results:  [facility] 08/14/11 0518 08/13/11 1633  WBC 8.5 10.5  HGB 9.9* 9.5*  HCT 28.7* 28.6*  PLT 181 176   BMET  Basename 08/14/11 0518 08/13/11 0705  NA 134* 132*  K 4.3 3.7  CL 102 100  CO2 25 21  GLUCOSE 96 94  BUN 13 13  CREATININE 0.73 0.80  CALCIUM 7.9* 7.6*  ALT -- --    Studies/Results: Ct Head Wo Contrast  08/13/2011  *RADIOLOGY REPORT*  Clinical Data: Altered mental status.  CT HEAD WITHOUT CONTRAST  Technique:  Contiguous axial images were obtained from the base of the skull through the vertex without contrast.  Comparison: None.  Findings: Bony calvarium appears intact.  Mild diffuse cortical atrophy is noted.  Chronic ischemic white matter disease is noted. No mass effect or midline shift is noted.  Ventricular size is within normal limits.  There is no evidence of mass, hemorrhage or acute infarction.  IMPRESSION: Mild diffuse cortical atrophy.  Chronic ischemic white matter disease. No acute intracranial abnormality seen.  Original Report Authenticated By: Venita Sheffield., M.D.   Dg Abd Portable 1v  08/12/2011  *RADIOLOGY REPORT*  Clinical Data: 78 year old female with abdominal pain and distention.  PORTABLE ABDOMEN - 1 VIEW  Comparison: Pelvis series 08/11/2011.  Findings: AP portable supine views at 2037 hours.  Cardiac pacemaker leads.  Postoperative changes to both proximal femurs again noted. Nonobstructed bowel gas pattern.  Calcified  atherosclerosis. No acute osseous abnormality identified.  No definite pneumoperitoneum.  IMPRESSION: Nonobstructed bowel gas pattern.  Original Report Authenticated By: Harley Hallmark, M.D.   ECHOCARDIOGRAM Study Conclusions  - Left ventricle: The cavity size was normal. There was mild concentric hypertrophy. Systolic function was normal. The estimated ejection fraction was in the range of  50% to 55%. Wall motion was normal; there were no regional wall motion abnormalities. Doppler parameters are consistent with abnormal left ventricular relaxation (grade 1 diastolic dysfunction). - Aortic valve: Severely calcified annulus. Trileaflet; severely calcified leaflets. There was severe stenosis. Mild regurgitation. Valve area: 0.84cm^2(VTI). Valve area: 0.93cm^2 (Vmax). - Atrial septum: No defect or patent foramen ovale was identified. - Pulmonary arteries: PA peak pressure: 54mm Hg (S). - Pericardium, extracardiac: A trivial pericardial effusion was identified. Features were not consistent with tamponade physiology.    Medications:  Scheduled:    . amiodarone  200 mg Oral BID  . cefTRIAXone (ROCEPHIN) IVPB 1 gram/50 mL D5W  1 g Intravenous Q24H  . digoxin  0.125 mg Oral Daily  . docusate sodium  100 mg Oral BID  . enoxaparin (LOVENOX) injection  40 mg Subcutaneous Q24H  . ferrous sulfate  325 mg Oral TID PC  . levothyroxine  100 mcg Oral QAC breakfast  . metoprolol  5 mg Intravenous Once  . metoprolol tartrate  25 mg Oral BID  . sodium chloride  250 mL Intravenous Once  . warfarin  5 mg Oral ONCE-1800  . warfarin  6 mg Oral ONCE-1800  . Warfarin - Pharmacist Dosing Inpatient   Does not apply q1800  . DISCONTD: ondansetron  4 mg Intravenous Once   Continuous:    . sodium chloride 0.9 % 1,000 mL with potassium chloride 10 mEq infusion 50 mL/hr at 08/14/11 0530  . DISCONTD: amiodarone (NEXTERONE PREMIX) 360 mg/200 mL dextrose 0.5 mg/min (08/13/11 0518)   ZOX:[id number], acetaminophen, albuterol, menthol-cetylpyridinium, methocarbamol (ROBAXIN) IV, methocarbamol, metoCLOPramide (REGLAN) injection, metoCLOPramide, ondansetron (ZOFRAN) IV, ondansetron, phenol, polyethylene glycol, traMADol-acetaminophen, DISCONTD: HYDROcodone-acetaminophen  Assessment/Plan:  Principal Problem:  *Atrial fibrillation with rapid ventricular response Active Problems:  Hip fracture  UTI  (lower urinary tract infection)  Abdominal distention  Atrial fibrillation  Murmur, cardiac  Chronic anticoagulation  Hypotension  Severe aortic stenosis    Atrial Fibrillation with RVR/H/o Pacemaker Unfortunately heart rate went back up today. Will give IV metoprolol. Dr. Sharyn Lull notified and he increased dose of Amiodarone. Continue Digoxin.  Echo showed normal EF with diastolic dysfunction. Severe aortic stenosis was seen.  Acute Encephalopathy Mentation is better today. Could have been medication effect as well. Possibly from hospital stay and surgery/acute illness, UTI. Ct head was negative for acute process. Cannot get MRI due to pacemaker.   Hypotension, asymptomatic Resolved. Monitor closely.  Left Hip fracture Status post ORIF. Ortho following.  Anemia likely due to acute blood loss Pain has some swelling over left thigh area. Probably from Post surgical bleeding. Hgb did drop but has been stable.  Chronic Anticoagulation Patient was anticoagulated for history of atrial fibrillation. Warfarin had been discontinued for surgery. Has been resumed. Caution due to anemia.  UTI (lower urinary tract infection) with E coli Continue Ceftriaxone (started 7/18).   Questionable Abdominal distention Not an active problem. Abdomen is soft and non tender. AXR was unremarkable.  Code Status Full Code  DVT Prophylaxis On SCD's and Lovenox.   Disposition Will need SNF on discharge. Was anticipating discharge tomorrow but HR remains an issue.    LOS: 5 days  [facility]  [facility] Pager [phone] 08/14/2011, 10:02 AM

## 2011-08-14 NOTE — Care Management Note (Unsigned)
    Page 1 of 1   08/14/2011     2:12:17 PM   CARE MANAGEMENT NOTE 08/14/2011  Patient:  [name]   Account Number:  [id number]  Date Initiated:  08/10/2011  Documentation initiated by:  [name]  Subjective/Objective Assessment:   Fall - hip fx - probable surgery.  Also with UTI - started on IV antibiotics.  Lives alone.     Action/Plan:   Anticipated DC Date:  08/17/2011   Anticipated DC Plan:  SKILLED NURSING FACILITY  In-house referral  Clinical Social Worker      DC Planning Services  CM consult      Choice offered to / List presented to:             Status of service:  In process, will continue to follow Medicare Important Message given?   (If response is "NO", the following Medicare IM given date fields will be blank) Date Medicare IM given:   Date Additional Medicare IM given:    Discharge Disposition:    Per UR Regulation:  Reviewed for med. necessity/level of care/duration of stay  If discussed at Long Length of Stay Meetings, dates discussed:    Comments:  Contact:  Son [name]  [phone]   [phone]  08/14/11 Crystal Branscum,RN,BSN 1200 PT TO DC TO SNF WHEN MEDICALLY STABLE.  CSW FOLLOWING TO FACILITATE DC TO SNF.  WILL FOLLOW/ASSIST WITH DC AS NEEDED.  08-10-11 10:30am [name], RNBSN - [phone] Talked with patient briefly as she had just gotten pain med for pain in hip.  Now comfortable but sleepy. Did find out - that she does live at home, has four sons.  Did find out oldest, [name], lives in [place], and youngest is enroute back to [place] from [place] after building ramp and getting patient's sister set up at home.  Informed patient that post surgery she may need rehab prior to returning home.  Patient nodded head in understanding.

## 2011-08-14 NOTE — Progress Notes (Signed)
Subjective:  Patient denies any chest pain or shortness of breath. Patient again went into A. fib with RVR. Received extra dose of amiodarone heart rate in 90s to 100 now.  Objective:  Vital Signs in the last 24 hours: Temp:  [98.1 F (36.7 C)-98.8 F (37.1 C)] 98.1 F (36.7 C) (07/22 0535) Pulse Rate:  [73-76] 76  (07/22 0535) Resp:  [18] 18  (07/22 0800) BP: (109-128)/(71-79) 128/71 mmHg (07/22 0931) SpO2:  [94 %-98 %] 95 % (07/22 0800)  Intake/Output from previous day: 07/21 0701 - 07/22 0700 In: 480 [P.O.:480] Out: 901 [Urine:900; Stool:1] Intake/Output from this shift: Total I/O In: 120 [P.O.:120] Out: -   Physical Exam: Neck: no adenopathy, no carotid bruit, no JVD and supple, symmetrical, trachea midline Lungs: clear to auscultation bilaterally Heart: irregularly irregular rhythm, S1, S2 normal and Soft systolic murmur noted Abdomen: soft, non-tender; bowel sounds normal; no masses,  no organomegaly Extremities: extremities normal, atraumatic, no cyanosis or edema and Left lateral hip area surgical dressing dry. Slightly more fullness noted at the surgical site  Lab Results:  [facility] 08/14/11 0518 08/13/11 1633  WBC 8.5 10.5  HGB 9.9* 9.5*  PLT 181 176    Basename 08/14/11 0518 08/13/11 0705  NA 134* 132*  K 4.3 3.7  CL 102 100  CO2 25 21  GLUCOSE 96 94  BUN 13 13  CREATININE 0.73 0.80   No results found for this basename: TROPONINI:2,CK,MB:2 in the last 72 hours Hepatic Function Panel No results found for this basename: PROT,ALBUMIN,AST,ALT,ALKPHOS,BILITOT,BILIDIR,IBILI in the last 72 hours No results found for this basename: CHOL in the last 72 hours No results found for this basename: PROTIME in the last 72 hours  Imaging: Imaging results have been reviewed and Ct Head Wo Contrast  08/13/2011  *RADIOLOGY REPORT*  Clinical Data: Altered mental status.  CT HEAD WITHOUT CONTRAST  Technique:  Contiguous axial images were obtained from the base of the  skull through the vertex without contrast.  Comparison: None.  Findings: Bony calvarium appears intact.  Mild diffuse cortical atrophy is noted.  Chronic ischemic white matter disease is noted. No mass effect or midline shift is noted.  Ventricular size is within normal limits.  There is no evidence of mass, hemorrhage or acute infarction.  IMPRESSION: Mild diffuse cortical atrophy.  Chronic ischemic white matter disease. No acute intracranial abnormality seen.  Original Report Authenticated By: Venita Sheffield., M.D.   Dg Abd Portable 1v  08/12/2011  *RADIOLOGY REPORT*  Clinical Data: 78 year old female with abdominal pain and distention.  PORTABLE ABDOMEN - 1 VIEW  Comparison: Pelvis series 08/11/2011.  Findings: AP portable supine views at 2037 hours.  Cardiac pacemaker leads.  Postoperative changes to both proximal femurs again noted. Nonobstructed bowel gas pattern.  Calcified atherosclerosis. No acute osseous abnormality identified.  No definite pneumoperitoneum.  IMPRESSION: Nonobstructed bowel gas pattern.  Original Report Authenticated By: Harley Hallmark, M.D.    Cardiac Studies:  Assessment/Plan:  Status post left hip hemiarthroplasty postop day 1  Severe aortic stenosis  Recurrent Atrial fibrillation with rapid ventricular response  Status post fall/fracture left hip  Hypertension  probable tachybradycardia syndrome status post permanent pacemaker  Hypercholesteremia  Degenerative joint disease  UTI  Hypothyroidism  History of syncope in the past  Postop anemia probably secondary to hydration yesterday  Plan Increase amiodarone to 400 mg twice daily for one week and then 200 mg twice daily for one week and then 200 mg daily followup with  me in one month  LOS: 5 days    Briani Maul N 08/14/2011, 11:32 AM

## 2011-08-14 NOTE — Progress Notes (Signed)
HR back in a-fib rate 130-150's; MD paged to make aware; pt given all scheduled PO meds at this time which include amio, lopressor, and dig; will await callback.

## 2011-08-14 NOTE — Progress Notes (Signed)
OT Cancellation Note  Treatment cancelled today due to medical issues with patient which prohibited therapy. Will recheck on pt later in the day.   Alba Cory 08/14/2011, 10:37 AM

## 2011-08-14 NOTE — Progress Notes (Signed)
Dr. Rito Ehrlich in room to see pt at this time; will page Dr. Sharyn Lull at this time per MD orders.

## 2011-08-15 ENCOUNTER — Inpatient Hospital Stay (HOSPITAL_COMMUNITY): Payer: Non-veteran care

## 2011-08-15 DIAGNOSIS — R0682 Tachypnea, not elsewhere classified: Secondary | ICD-10-CM

## 2011-08-15 LAB — BASIC METABOLIC PANEL
Chloride: 101 mEq/L (ref 96–112)
GFR calc Af Amer: 87 mL/min — ABNORMAL LOW (ref >90)
GFR calc non Af Amer: 75 mL/min — ABNORMAL LOW (ref >90)
Potassium: 4.2 mEq/L (ref 3.5–5.1)
Sodium: 133 mEq/L — ABNORMAL LOW (ref 135–145)

## 2011-08-15 LAB — GLUCOSE, CAPILLARY: Glucose-Capillary: 114 mg/dL — ABNORMAL HIGH (ref 70–99)

## 2011-08-15 MED ORDER — METOPROLOL TARTRATE 50 MG PO TABS
50.0000 mg | ORAL_TABLET | Freq: Two times a day (BID) | ORAL | Status: DC
  Administered 2011-08-15 – 2011-08-16 (×2): 50 mg via ORAL
  Filled 2011-08-15 (×3): qty 1

## 2011-08-15 MED ORDER — METOPROLOL TARTRATE 25 MG PO TABS
25.0000 mg | ORAL_TABLET | Freq: Once | ORAL | Status: AC
  Administered 2011-08-15: 25 mg via ORAL
  Filled 2011-08-15: qty 1

## 2011-08-15 MED ORDER — WARFARIN SODIUM 6 MG PO TABS
6.0000 mg | ORAL_TABLET | Freq: Once | ORAL | Status: AC
  Administered 2011-08-15: 6 mg via ORAL
  Filled 2011-08-15: qty 1

## 2011-08-15 MED ORDER — CEPHALEXIN 500 MG PO CAPS
500.0000 mg | ORAL_CAPSULE | Freq: Three times a day (TID) | ORAL | Status: DC
  Administered 2011-08-15 – 2011-08-16 (×4): 500 mg via ORAL
  Filled 2011-08-15 (×6): qty 1

## 2011-08-15 MED ORDER — FUROSEMIDE 10 MG/ML IJ SOLN
20.0000 mg | Freq: Once | INTRAMUSCULAR | Status: AC
  Administered 2011-08-15: 20 mg via INTRAVENOUS
  Filled 2011-08-15: qty 2

## 2011-08-15 NOTE — Progress Notes (Signed)
Patient ID: [name], female   DOB: 01/27/1918, 78 y.o.   MRN: [id number] Subjective: 4 Days Post-Op Procedure(s) (LRB): ARTHROPLASTY BIPOLAR HIP (Left)    Patient reports pain as mild.  Objective:   VITALS:   Filed Vitals:   08/15/11 0439  BP: 127/84  Pulse:   Temp: 98.7 F (37.1 C)  Resp: 18    Neurovascular intact Incision: scant drainage  LABS  Basename 08/14/11 0518 08/13/11 1633 08/13/11 0705  HGB 9.9* 9.5* 9.7*  HCT 28.7* 28.6* 29.4*  WBC 8.5 10.5 10.1  PLT 181 176 164     Basename 08/14/11 0518 08/13/11 0705  NA 134* 132*  K 4.3 3.7  BUN 13 13  CREATININE 0.73 0.80  GLUCOSE 96 94     Basename 08/14/11 0518 08/13/11 0705  LABPT -- --  INR 1.18 1.25     Assessment/Plan: 4 Days Post-Op Procedure(s) (LRB): ARTHROPLASTY BIPOLAR HIP (Left)   Up with therapy PWB LLE Left hip dressing change with nursing today To SNF once medically stable

## 2011-08-15 NOTE — Progress Notes (Signed)
OT Cancellation Note  OT cancelled today due to medical issues with patient which prohibited therapy.  [name] 08/15/2011, 11:41 AM

## 2011-08-15 NOTE — Progress Notes (Signed)
ANTICOAGULATION CONSULT NOTE - Follow Up Consult  Pharmacy Consult for coumadin Indication: atrial fibrillation and VTE prophylaxis  No Known Allergies  Labs:  Basename 08/15/11 4098 08/14/11 0518 08/13/11 1633 08/13/11 0705  HGB -- 9.9* 9.5* --  HCT -- 28.7* 28.6* 29.4*  PLT -- 181 176 164  APTT -- -- -- --  LABPROT 16.4* 15.3* -- 16.0*  INR 1.30 1.18 -- 1.25  HEPARINUNFRC -- -- -- --  CREATININE 0.67 0.73 -- 0.80  CKTOTAL -- -- -- --  CKMB -- -- -- --  TROPONINI -- -- -- --    Estimated Creatinine Clearance: 37 ml/min (by C-G formula based on Cr of 0.67).  Assessment: Patient is a 78 y.o F on coumadin for afib and VTE px s/p hip repair.  INR is subtherapeutic today.  No bleeding noted.  Will monitor closely for drug-drug intxn since patient is also on amiodarone.  Goal of Therapy:  INR 2-3   Plan:  1) Repeat Coumadin 6mg  PO x1 today 2) Follow up AM INR  Thank you. Okey Regal, PharmD [phone]  08/15/2011,9:17 AM

## 2011-08-15 NOTE — Progress Notes (Signed)
PT Cancellation Note  Treatment cancelled today due to medical issues with patient which prohibited therapy. Pt with HR in 120's - 130's.  Jenille Laszlo 08/15/2011, 11:02 AM

## 2011-08-15 NOTE — Progress Notes (Signed)
Spoke with son who is at bedside and confirms plan for SNF bed at [facility] once medically stable. Also spoke with SNF rep and bed is available. Will await medical clearance for further SNF planning. Patient is agreeable and eager to go to [facility] for rehab. MD: please advise on tentative dc/ date once determined. [name], MSW, [facility] [phone]

## 2011-08-15 NOTE — Progress Notes (Signed)
PCP: [facility]  Brief HPI:  [name] is a 78 y.o. female with a past medical history of Pacemaker; A-fib; Hypertension; Hypothyroidism; and Hyperlipemia.  Presented with Fall on day of admission after tripping on a purse while at church. She denied any head injury. She has been able to ambulate well at baseline. 3 years ago she says she had a syncopal episode and her heart stopped and ever since she needed a pacemaker. Denied any chest pain or dyspnea on exertion. Stated she is not sure if she ever had an echo but probably had cardiac catheterization 3 years ago.   Past medical history:  Past Medical History  Diagnosis Date  . Pacemaker   . A-fib   . Hypertension   . Hypothyroidism   . Hyperlipemia     Consultants: Gaylord Shih Charlann Boxer)  Procedures: None so far  Subjective: Patient wants to go to rehab. Denies any pain. Denies shortness of breath.  Objective: Vital signs in last 24 hours: Temp:  [98.4 F (36.9 C)-98.7 F (37.1 C)] 98.7 F (37.1 C) (07/23 0439) Pulse Rate:  [74-118] 118  (07/23 0954) Resp:  [16-18] 18  (07/23 1107) BP: (112-130)/(71-84) 130/80 mmHg (07/23 0954) SpO2:  [88 %-100 %] 97 % (07/23 0954) Weight change:  Last BM Date: 08/13/11  Intake/Output from previous day: 07/22 0701 - 07/23 0700 In: 1190.8 [P.O.:720; I.V.:470.8] Out: 700 [Urine:700] Intake/Output this shift: Total I/O In: 240 [P.O.:240] Out: 1 [Stool:1]  Tele: HR 105-115 since this morning.  General appearance: alert, appears stated age, distracted and no distress, sitting up in chair Resp: clear to auscultation bilaterally. Tachypneic Cardio: irregularly irregular, tachy. Systolic murmur. No click, rub or gallop GI: soft, non-tender; bowel sounds normal; no masses,  no organomegaly Extremities: left thigh Incision site shows some swelling with minimal bruising. Tender. Pulses: 2+ and symmetric Skin: Skin color, texture, turgor normal. No rashes or lesions Neurologic: Alert, Oriented  to place, person, month and date and year. No focal motor deficits.   Lab Results:  Basename 08/14/11 0518 08/13/11 1633  WBC 8.5 10.5  HGB 9.9* 9.5*  HCT 28.7* 28.6*  PLT 181 176   BMET  Basename 08/15/11 0637 08/14/11 0518  NA 133* 134*  K 4.2 4.3  CL 101 102  CO2 24 25  GLUCOSE 102* 96  BUN 11 13  CREATININE 0.67 0.73  CALCIUM 8.0* 7.9*  ALT -- --    Studies/Results: No results found. ECHOCARDIOGRAM Study Conclusions  - Left ventricle: The cavity size was normal. There was mild concentric hypertrophy. Systolic function was normal. The estimated ejection fraction was in the range of 50% to 55%. Wall motion was normal; there were no regional wall motion abnormalities. Doppler parameters are consistent with abnormal left ventricular relaxation (grade 1 diastolic dysfunction). - Aortic valve: Severely calcified annulus. Trileaflet; severely calcified leaflets. There was severe stenosis. Mild regurgitation. Valve area: 0.84cm^2(VTI). Valve area: 0.93cm^2 (Vmax). - Atrial septum: No defect or patent foramen ovale was identified. - Pulmonary arteries: PA peak pressure: 54mm Hg (S). - Pericardium, extracardiac: A trivial pericardial effusion was identified. Features were not consistent with tamponade physiology.    Medications:  Scheduled:    . amiodarone  400 mg Oral BID  . cefTRIAXone (ROCEPHIN) IVPB 1 gram/50 mL D5W  1 g Intravenous Q24H  . digoxin  0.125 mg Oral Daily  . docusate sodium  100 mg Oral BID  . enoxaparin (LOVENOX) injection  40 mg Subcutaneous Q24H  . ferrous sulfate  325 mg  Oral TID PC  . levothyroxine  100 mcg Oral QAC breakfast  . metoprolol tartrate  50 mg Oral BID  . metoprolol tartrate  25 mg Oral Once  . sodium chloride  250 mL Intravenous Once  . warfarin  5 mg Oral ONCE-1800  . warfarin  6 mg Oral ONCE-1800  . warfarin  6 mg Oral ONCE-1800  . Warfarin - Pharmacist Dosing Inpatient   Does not apply q1800  . DISCONTD: metoprolol tartrate  25  mg Oral BID   Continuous:    . DISCONTD: sodium chloride 0.9 % 1,000 mL with potassium chloride 10 mEq infusion 50 mL/hr at 08/15/11 0151   ZOX:WRUEAVWUJWJXB, acetaminophen, albuterol, menthol-cetylpyridinium, methocarbamol (ROBAXIN) IV, methocarbamol, metoCLOPramide (REGLAN) injection, metoCLOPramide, ondansetron (ZOFRAN) IV, ondansetron, phenol, polyethylene glycol, traMADol-acetaminophen  Assessment/Plan:  Principal Problem:  *Atrial fibrillation with rapid ventricular response Active Problems:  Hip fracture  UTI (lower urinary tract infection)  Abdominal distention  Atrial fibrillation  Murmur, cardiac  Chronic anticoagulation  Hypotension  Severe aortic stenosis    Atrial Fibrillation with RVR/H/o Pacemaker and Severe Aortic Stenosis She continues to be tachycardic. But rate is better. Will increase dose of Metoprolol as her BP should tolerate it. Dose of Amiodarone was increased yesterday by Dr. Sharyn Lull. Continue Digoxin.  Echo showed normal EF with diastolic dysfunction. Severe aortic stenosis was seen.  Acute Encephalopathy Mentation continues to improve. Could have been medication effect or from hospital stay and surgery/acute illness, UTI. Ct head was negative for acute process. Cannot get MRI due to pacemaker.   Tachypnea Patient denies shortness of breath. Will get CXR. Stop IVF.  Hypotension, asymptomatic Resolved. Monitor closely.  Left Hip fracture Status post ORIF. Ortho following.  Anemia likely due to acute blood loss Pain has some swelling over left thigh area. Probably from Post surgical bleeding. Hgb did drop but has been stable.  Chronic Anticoagulation Patient was anticoagulated for history of atrial fibrillation. Warfarin had been discontinued for surgery. Has been resumed. Caution due to anemia.  UTI (lower urinary tract infection) with E coli On Ceftriaxone (started 7/18). Change to oral agents.  Questionable Abdominal distention Not an  active problem. Abdomen is soft and non tender. AXR was unremarkable.  Code Status Full Code  DVT Prophylaxis On SCD's and Lovenox.   Disposition She will be going to SNF for rehab. We are waiting on HR to be better controlled. Hopefully with increase in dose of Metoprolol this will be achieved. Follow up on CXR.    LOS: 6 days   [facility]  [facility] Pager [phone] 08/15/2011, 12:21 PM

## 2011-08-16 LAB — PROTIME-INR: Prothrombin Time: 18.4 seconds — ABNORMAL HIGH (ref 11.6–15.2)

## 2011-08-16 MED ORDER — AMIODARONE HCL 200 MG PO TABS: 200.0000 mg | ORAL_TABLET | Freq: Every day | ORAL | Status: DC

## 2011-08-16 MED ORDER — AMIODARONE HCL 400 MG PO TABS: 400.0000 mg | ORAL_TABLET | Freq: Two times a day (BID) | ORAL | Status: DC

## 2011-08-16 MED ORDER — POLYETHYLENE GLYCOL 3350 17 G PO PACK: 17.0000 g | PACK | Freq: Every day | ORAL | Status: AC | PRN

## 2011-08-16 MED ORDER — METOPROLOL TARTRATE 50 MG PO TABS: 50.0000 mg | ORAL_TABLET | Freq: Two times a day (BID) | ORAL | Status: DC

## 2011-08-16 MED ORDER — DIGOXIN 125 MCG PO TABS: 0.1250 mg | ORAL_TABLET | Freq: Every day | ORAL | Status: DC

## 2011-08-16 MED ORDER — ACETAMINOPHEN 325 MG PO TABS: 650.0000 mg | ORAL_TABLET | Freq: Four times a day (QID) | ORAL | Status: AC | PRN

## 2011-08-16 MED ORDER — AMIODARONE HCL 200 MG PO TABS: 200.0000 mg | ORAL_TABLET | Freq: Two times a day (BID) | ORAL | Status: DC

## 2011-08-16 MED ORDER — FERROUS SULFATE 325 (65 FE) MG PO TABS: 325.0000 mg | ORAL_TABLET | Freq: Three times a day (TID) | ORAL | Status: DC

## 2011-08-16 MED ORDER — WARFARIN SODIUM 6 MG PO TABS
6.0000 mg | ORAL_TABLET | Freq: Once | ORAL | Status: DC
  Filled 2011-08-16: qty 1

## 2011-08-16 MED ORDER — CEPHALEXIN 500 MG PO CAPS: 500.0000 mg | ORAL_CAPSULE | Freq: Three times a day (TID) | ORAL | Status: AC

## 2011-08-16 MED ORDER — TRAMADOL-ACETAMINOPHEN 37.5-325 MG PO TABS: 1.0000 | ORAL_TABLET | Freq: Four times a day (QID) | ORAL | Status: AC | PRN

## 2011-08-16 MED ORDER — DSS 100 MG PO CAPS: 100.0000 mg | ORAL_CAPSULE | Freq: Two times a day (BID) | ORAL | Status: AC

## 2011-08-16 MED FILL — Amiodarone HCl Tab 200 MG: ORAL | Qty: 1 | Status: AC

## 2011-08-16 MED FILL — Ceftriaxone Sodium for IV Soln 1 GM and Dextrose 3.74% 50 ML: INTRAVENOUS | Qty: 50 | Status: AC

## 2011-08-16 MED FILL — Ferrous Sulfate Tab 325 MG (65 MG Elemental Fe): ORAL | Qty: 1 | Status: AC

## 2011-08-16 MED FILL — Hydrocodone-Acetaminophen Tab 5-325 MG: ORAL | Qty: 1 | Status: AC

## 2011-08-16 MED FILL — Docusate Sodium Cap 100 MG: ORAL | Qty: 1 | Status: AC

## 2011-08-16 MED FILL — Metoprolol Tartrate Tab 25 MG: ORAL | Qty: 1 | Status: AC

## 2011-08-16 NOTE — Discharge Summary (Signed)
[facility]                                                                                   [name], is a 78 y.o. female  DOB 03/12/1918  MRN [id number].  Admission date:  08/09/2011  Discharge Date:  08/16/2011  Primary MD  Provider Not In System  Admitting Physician  Osvaldo Shipper, MD  Admission Diagnosis  Fx humeral neck [812.01] fall left femoral neck fracture l fem neck fracture  Discharge Diagnosis     Principal Problem:  *Atrial fibrillation with rapid ventricular response Active Problems:  Hip fracture  UTI (lower urinary tract infection)  Abdominal distention  Atrial fibrillation  Murmur, cardiac  Chronic anticoagulation  Hypotension  Severe aortic stenosis  Tachypnea      Past Medical History  Diagnosis Date  . Pacemaker   . A-fib   . Hypertension   . Hypothyroidism   . Hyperlipemia     Past Surgical History  Procedure Date  . Humerus fracture surgery   . Hip arthroplasty 08/11/2011    Procedure: ARTHROPLASTY BIPOLAR HIP;  Surgeon: Shelda Pal, MD;  Location: [facility] OR;  Service: Orthopedics;  Laterality: Left;     Recommendations for primary care physician for things to follow:  Please follow with orthopedic Dr Durene Romans in 1 week,  Follow with PCP in 1 week. Check INR daily X3 days then as per NH protocol to Keep INR 2 to 3. Check digoxin lever in 3 days and adjust dose if needed. Change dressing daily.     Discharge Diagnoses:   Principal Problem:  *Atrial fibrillation with rapid ventricular response Active Problems:  Hip fracture  UTI (lower urinary tract infection)  Abdominal distention  Atrial fibrillation  Murmur, cardiac  Chronic anticoagulation  Hypotension  Severe aortic stenosis  Tachypnea    Discharge Condition: stable.   Diet recommendation: See Discharge Instructions below  Consults also Dr. Charlann Boxer  History of present illness and Hospital Course: See H&P, Labs, Consult and Test reports  for all details in brief, patient presented with a fall who on day of admission, after tripping , she was found to have left femoral neck fracture, orthopedic was consulted, where she had left hip hemiarthroplasty done by Dr. Earleen Reaper July 19/2013.  As well patient has history of A. fib on anticoagulation, had A. fib with RVR where she had her antiarrhythmic medication adjusted were any drawn and digoxin were added, and her metoprolol dose was increased, patient is on chronic anticoagulation warfarin, which was held initially for surgery, currently resumed with last INR level I.5 today. she had echo which showed normal ejection fraction with diastolic dysfunction, and severe aortic stenosis,  As well patient developed anemia after surgery were him on Lodine drop from baseline 13-10, and this is thought to be due to acute blood loss from surgery, where she was started on iron supplement.  Patient was diagnosed with uterine tract infection treated with 3 days of IV Rocephin, will be discharged on 3 days total of by mouth Keflex.     Today   Subjective:   [name] today has no headache,no chest abdominal pain,no  new weakness tingling or numbness, feels much better wants to go home today.   Objective:   Blood pressure 111/77, pulse 88, temperature 97.9 F (36.6 C), temperature source Oral, resp. rate 16, height 5\' 2"  (1.575 m), weight 56.745 kg (125 lb 1.6 oz), SpO2 96.00%.   Intake/Output Summary (Last 24 hours) at 08/16/11 1054 Last data filed at 08/16/11 0730  Gross per 24 hour  Intake    480 ml  Output   2900 ml  Net  -2420 ml    Exam Awake Alert, Oriented *3, No new F.N deficits, Normal affect [place].AT,PERRAL Supple Neck,No JVD, No cervical lymphadenopathy appriciated.  Symmetrical Chest wall movement, Good air movement bilaterally, CTAB RRR,No Gallops,Rubs or new Murmurs, No Parasternal Heave +ve B.Sounds, Abd Soft, Non tender, No organomegaly appriciated, No rebound  -guarding or rigidity. No Cyanosis, Clubbing or edema, No new Rash or bruise  Data Review      Dg Chest 1 View  08/09/2011  *RADIOLOGY REPORT*  Clinical Data: Preoperative exam, left hip fracture  CHEST - 1 VIEW  Comparison: None.  Findings: Dual lead left pacemaker in place.  Heart size is mildly enlarged without overt edema.  Mild central vascular congestion. No pleural effusion.  The minimal bibasilar curvilinear scarring or atelectasis.  IMPRESSION: Mild cardiomegaly without overt edema or focal acute finding.  Original Report Authenticated By: Harrel Lemon, M.D.   Dg Hip Complete Left  08/09/2011  *RADIOLOGY REPORT*  Clinical Data: Hip pain, fall  LEFT HIP - COMPLETE 2+ VIEW  Comparison: None.  Findings: Mildly angulated acute left femoral neck fracture identified.  Left femoral head is properly located.  No evidence of previous right femoral neck screw fixation.  Mild degenerative changes at the symphysis pubis.  Normal visualized bowel gas pattern.  No displaced pelvic fracture.  IMPRESSION: Mildly angulated acute left femoral neck fracture.  Original Report Authenticated By: Harrel Lemon, M.D.   Ct Head Wo Contrast  08/13/2011  *RADIOLOGY REPORT*  Clinical Data: Altered mental status.  CT HEAD WITHOUT CONTRAST  Technique:  Contiguous axial images were obtained from the base of the skull through the vertex without contrast.  Comparison: None.  Findings: Bony calvarium appears intact.  Mild diffuse cortical atrophy is noted.  Chronic ischemic white matter disease is noted. No mass effect or midline shift is noted.  Ventricular size is within normal limits.  There is no evidence of mass, hemorrhage or acute infarction.  IMPRESSION: Mild diffuse cortical atrophy.  Chronic ischemic white matter disease. No acute intracranial abnormality seen.  Original Report Authenticated By: Venita Sheffield., M.D.   Dg Pelvis Portable  08/11/2011  *RADIOLOGY REPORT*  Clinical Data: Postop following  hip surgery.  PORTABLE PELVIS  Comparison: Preoperative radiograph 08/09/2011.  Findings: Compared to the prior examination, there has been interval left hip hemiarthroplasty.  The femoral stem appears appropriately located, without obvious periprosthetic fracture. The prosthetic femoral head projects over the left acetabulum (location cannot be confirmed on these frontal projections).  There is a small amount of gas in the overlying soft tissues, and surgical staples lateral to the left hip joint.  Postoperative changes of ORIF in the right femoral neck with three cannulated screws are again noted.  IMPRESSION: 1.  Expected postoperative appearance of the left hip status post hemiarthroplasty, without immediate complicating features, as above.  Original Report Authenticated By: Florencia Reasons, M.D.   Dg Chest Port 1 View  08/15/2011  *RADIOLOGY REPORT*  Clinical Data: Shortness of  breath.  PORTABLE CHEST - 1 VIEW  Comparison: 08/09/2011.  Findings: Sequential pacemaker enters from the left.  The right atrial lead is in a slightly different position possibly related to projection.  Attention to this on follow-up.  Cardiomegaly.  Pulmonary vascular congestion.  Interval development of retro cardiac consolidation which may represent atelectasis, infiltrate and / or pleural effusion.  Calcified tortuous aorta.  No gross pneumothorax.  IMPRESSION: Sequential pacemaker enters from the left.  The right atrial lead is in a slightly different position possibly related to projection. Attention to this on follow-up.  Cardiomegaly.  Pulmonary vascular congestion.  Interval development of retro cardiac consolidation which may represent atelectasis, infiltrate and / or pleural effusion.  Calcified tortuous aorta.  Original Report Authenticated By: Fuller Canada, M.D.   Dg Abd Portable 1v  08/12/2011  *RADIOLOGY REPORT*  Clinical Data: 78 year old female with abdominal pain and distention.  PORTABLE ABDOMEN - 1 VIEW   Comparison: Pelvis series 08/11/2011.  Findings: AP portable supine views at 2037 hours.  Cardiac pacemaker leads.  Postoperative changes to both proximal femurs again noted. Nonobstructed bowel gas pattern.  Calcified atherosclerosis. No acute osseous abnormality identified.  No definite pneumoperitoneum.  IMPRESSION: Nonobstructed bowel gas pattern.  Original Report Authenticated By: Harley Hallmark, M.D.    Micro Results    Recent Results (from the past 240 hour(s))  URINE CULTURE     Status: Normal   Collection Time   08/10/11  1:03 AM      Component Value Range Status Comment   Specimen Description URINE, CATHETERIZED   Final    Special Requests ADDED AT 1011   Final    Culture  Setup Time 08/10/2011 10:30   Final    Colony Count >=100,000 COLONIES/ML   Final    Culture ESCHERICHIA COLI   Final    Report Status 08/12/2011 FINAL   Final    Organism ID, Bacteria ESCHERICHIA COLI   Final      CBC w Diff: Lab Results  Component Value Date   WBC 8.5 08/14/2011   HGB 9.9* 08/14/2011   HCT 28.7* 08/14/2011   PLT 181 08/14/2011   LYMPHOPCT 18 08/09/2011   MONOPCT 4 08/09/2011   EOSPCT 0 08/09/2011   BASOPCT 0 08/09/2011    CMP: Lab Results  Component Value Date   NA 133* 08/15/2011   K 4.2 08/15/2011   CL 101 08/15/2011   CO2 24 08/15/2011   BUN 11 08/15/2011   CREATININE 0.67 08/15/2011   PROT 6.5 08/10/2011   ALBUMIN 3.6 08/10/2011   BILITOT 0.4 08/10/2011   ALKPHOS 56 08/10/2011   AST 20 08/10/2011   ALT 13 08/10/2011  .   Discharge Instructions       Follow-up Information    Follow up with Shelda Pal, MD. Schedule an appointment as soon as possible for a visit in 1 week.   Contact information:   [facility] [address], [address] [place] [address] [phone]       Follow up with Provider Not In System in 1 week.           Discharge Medications   Medication List  As of 08/16/2011 10:54 AM   START taking these medications          acetaminophen 325 MG tablet   Commonly known as: TYLENOL   Take 2 tablets (650 mg total) by mouth every 6 (six) hours as needed (or Fever >/= 101).      *  amiodarone 400 MG tablet   Commonly known as: PACERONE   Take 1 tablet (400 mg total) by mouth 2 (two) times daily.      * amiodarone 200 MG tablet   Commonly known as: PACERONE   Take 1 tablet (200 mg total) by mouth 2 (two) times daily.   Start taking on: 08/31/2011      * amiodarone 200 MG tablet   Commonly known as: PACERONE   Take 1 tablet (200 mg total) by mouth daily.   Start taking on: 09/07/2011      cephALEXin 500 MG capsule   Commonly known as: KEFLEX   Take 1 capsule (500 mg total) by mouth every 8 (eight) hours.      digoxin 0.125 MG tablet   Commonly known as: LANOXIN   Take 1 tablet (0.125 mg total) by mouth daily.      DSS 100 MG Caps   Take 100 mg by mouth 2 (two) times daily.      ferrous sulfate 325 (65 FE) MG tablet   Take 1 tablet (325 mg total) by mouth 3 (three) times daily after meals.      polyethylene glycol packet   Commonly known as: MIRALAX / GLYCOLAX   Take 17 g by mouth daily as needed.      traMADol-acetaminophen 37.5-325 MG per tablet   Commonly known as: ULTRACET   Take 1 tablet by mouth every 6 (six) hours as needed.     * Notice: This list has 3 medication(s) that are the same as other medications prescribed for you. Read the directions carefully, and ask your doctor or other care provider to review them with you.       CHANGE how you take these medications         metoprolol 50 MG tablet   Commonly known as: LOPRESSOR   Take 1 tablet (50 mg total) by mouth 2 (two) times daily.   What changed: - medication strength - dose         CONTINUE taking these medications         amLODipine 5 MG tablet   Commonly known as: NORVASC      feeding supplement Liqd      levothyroxine 100 MCG tablet   Commonly known as: SYNTHROID, LEVOTHROID      rosuvastatin 5 MG tablet    Commonly known as: CRESTOR      traZODone 50 MG tablet   Commonly known as: DESYREL      warfarin 5 MG tablet   Commonly known as: COUMADIN          Where to get your medications    These are the prescriptions that you need to pick up.   You may get these medications from any pharmacy.         acetaminophen 325 MG tablet   traMADol-acetaminophen 37.5-325 MG per tablet         Information on where to get these meds is not yet available. Ask your nurse or doctor.         amiodarone 200 MG tablet   amiodarone 400 MG tablet   cephALEXin 500 MG capsule   digoxin 0.125 MG tablet   DSS 100 MG Caps   ferrous sulfate 325 (65 FE) MG tablet   metoprolol 50 MG tablet   polyethylene glycol packet               Total Time in preparing paper work, data evaluation  and todays exam - 35 minutes  Aleiyah Halpin M.D on 08/16/2011 at 10:54 AM  [facility] Office  [phone]

## 2011-08-16 NOTE — Progress Notes (Signed)
PT in with pt at this time; pt up to chair; foley d/c per MD order; plan to d/c pt to SNF this afternoon; will cont. To monitor; call bell w/i reach.

## 2011-08-16 NOTE — Progress Notes (Signed)
ANTICOAGULATION CONSULT NOTE - Follow Up Consult  Pharmacy Consult for coumadin Indication: atrial fibrillation and VTE prophylaxis  No Known Allergies  Labs:  Basename 08/16/11 0510 08/15/11 0637 08/14/11 0518 08/13/11 1633  HGB -- -- 9.9* 9.5*  HCT -- -- 28.7* 28.6*  PLT -- -- 181 176  APTT -- -- -- --  LABPROT 18.4* 16.4* 15.3* --  INR 1.50* 1.30 1.18 --  HEPARINUNFRC -- -- -- --  CREATININE -- 0.67 0.73 --  CKTOTAL -- -- -- --  CKMB -- -- -- --  TROPONINI -- -- -- --    Estimated Creatinine Clearance: 37 ml/min (by C-G formula based on Cr of 0.67).  Assessment: Patient is a 78 y.o F on coumadin for afib and VTE px s/p hip repair.  INR is subtherapeutic today.  No bleeding noted.  Will monitor closely for drug-drug intxn since patient is also on amiodarone.  Goal of Therapy:  INR 2-3   Plan:  1) Repeat Coumadin 6mg  PO x1 today 2) Follow up AM INR 3) Continue Lovenox 40 mg sq daily  Thank you. Okey Regal, PharmD [phone]  08/16/2011,9:45 AM

## 2011-08-16 NOTE — Progress Notes (Signed)
Patient for d/c today to SNF bed at [facility]. Family and patient agreeable to this plan- plan transfer via EMS. Reece Levy, MSW, Theresia Majors [phone]

## 2011-08-16 NOTE — Evaluation (Signed)
Occupational Therapy Evaluation Patient Details Name: [name] MRN: [id number] DOB: 04/14/1918 Today's Date: 08/16/2011 Time: 3086-5784 OT Time Calculation (min): 28 min  OT Assessment / Plan / Recommendation Clinical Impression  Pleasant 78 yr old female admitted to [facility] hospital secondary to fall resulting in left femoral neck fracture.  Pt underwent hemiarthroplasty of the left hip on 7/22.  Now presents with significant impairments in selfcare independence.  Will benefit from acute OT services to address theses issues.  Since pt lived alone prior to this admission, feel she will need extensive SNF rehab for follow-up to reach supervision/modified independent level.    OT Assessment  Patient needs continued OT Services    Follow Up Recommendations  Skilled nursing facility    Barriers to Discharge Decreased caregiver support    Equipment Recommendations  Defer to next venue       Frequency  Min 2X/week    Precautions / Restrictions Precautions Precautions: Posterior Hip;Fall Restrictions Weight Bearing Restrictions: Yes LLE Weight Bearing: Partial weight bearing LLE Partial Weight Bearing Percentage or Pounds: 50%   Pertinent Vitals/Pain HR maintained from 90-98 during session O2 sats remained greater than 96% on room air    ADL  Eating/Feeding: Performed;Independent Where Assessed - Eating/Feeding: Chair Grooming: Simulated;Set up Where Assessed - Grooming: Supported sitting Upper Body Bathing: Simulated;Set up Where Assessed - Upper Body Bathing: Supported sitting Lower Body Bathing: Simulated;+2 Total assistance Lower Body Bathing: Patient Percentage: 30% Where Assessed - Lower Body Bathing: Supported sit to stand Upper Body Dressing: Simulated;Set up Where Assessed - Upper Body Dressing: Supported sitting Lower Body Dressing: Simulated;+2 Total assistance Lower Body Dressing: Patient Percentage: 30% Where Assessed - Lower Body Dressing: Supported sit to  stand Toilet Transfer: Simulated;Maximal assistance Toilet Transfer Method: Surveyor, minerals: Other (comment) (To bedside chair, pt with cathetor) Toileting - Clothing Manipulation and Hygiene: +2 Total assistance Toileting - Clothing Manipulation and Hygiene: Patient Percentage: 30% Tub/Shower Transfer Method: Not assessed Transfers/Ambulation Related to ADLs: Pt requires max assist for stand pivot to bedside chair from EOB. ADL Comments: Pt needing total +2 (pt 30%) for simulated selfcare tasks.  Unable to state any posterior hip precautions or awareness of weightbearing status.    OT Diagnosis: Generalized weakness;Acute pain  OT Problem List: Decreased strength;Decreased activity tolerance;Decreased safety awareness;Impaired balance (sitting and/or standing);Decreased knowledge of use of DME or AE;Decreased knowledge of precautions;Pain OT Treatment Interventions: Self-care/ADL training;Therapeutic activities;DME and/or AE instruction;Balance training;Patient/family education   OT Goals Acute Rehab OT Goals OT Goal Formulation: With patient Time For Goal Achievement: 08/30/11 ADL Goals Pt Will Perform Upper Body Bathing: with supervision;Unsupported;Sitting, edge of bed ADL Goal: Upper Body Bathing - Progress: Goal set today Pt Will Perform Lower Body Bathing: with mod assist;Sit to stand from bed;Sit to stand from chair;with adaptive equipment ADL Goal: Lower Body Bathing - Progress: Goal set today Pt Will Perform Upper Body Dressing: with supervision;Unsupported;Sitting, bed ADL Goal: Upper Body Dressing - Progress: Goal set today Pt Will Perform Lower Body Dressing: with mod assist;with adaptive equipment;Sit to stand from bed ADL Goal: Lower Body Dressing - Progress: Goal set today Pt Will Transfer to Toilet: with mod assist;3-in-1;with DME ADL Goal: Toilet Transfer - Progress: Goal set today Miscellaneous OT Goals Miscellaneous OT Goal #1: Pt will state 2/3  hip precautions independently. OT Goal: Miscellaneous Goal #1 - Progress: Goal set today  Visit Information  Last OT Received On: 08/16/11    Subjective Data  Subjective: "I'm not sure how this happened."  Patient Stated Goal: Did not state but agreeable to get out of bed.   Prior Functioning  Vision/Perception  Home Living Lives With: Alone Type of Home: House Home Access: Level entry Home Layout: One level Bathroom Shower/Tub: Health visitor: Handicapped height Bathroom Accessibility: Yes How Accessible: Accessible via walker Home Adaptive Equipment: Shower chair with back;Straight cane Prior Function Level of Independence: Independent with assistive device(s) Driving: No Vocation: Retired Musician: No difficulties Dominant Hand: Right   Vision - Assessment Vision Assessment: Vision not tested Perception Perception: Within Functional Limits Praxis Praxis: Intact  Cognition  Overall Cognitive Status: Impaired Area of Impairment: Memory Arousal/Alertness: Awake/alert Orientation Level: Disoriented to;Place;Time;Situation Behavior During Session: WFL for tasks performed Current Attention Level: Focused Memory: Decreased recall of precautions Following Commands: Follows one step commands consistently Safety/Judgement: Decreased awareness of safety precautions    Extremity/Trunk Assessment Right Upper Extremity Assessment RUE ROM/Strength/Tone: Unable to fully assess RUE ROM/Strength/Tone Deficits: 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 RUE Sensation: WFL - Light Touch RUE Coordination: Deficits RUE Coordination Deficits: Arthritic changes in fingers making FM tasks more difficult. 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 LUE Sensation: WFL - Light Touch LUE Coordination: Deficits LUE Coordination Deficits: Arthritic changes in fingers making FM tasks more difficult. Trunk Assessment Trunk Assessment: Kyphotic   Mobility Bed Mobility Bed Mobility: Supine to Sit Rolling Left: 1: +1 Total assist Sitting - Scoot to Edge of Bed: 1: +1 Total assist Transfers Transfers: Sit to Stand;Stand to Sit Sit to Stand: 2: Max assist Stand to Sit: 2: Max Conservation officer, nature Assessed: Yes Static Sitting Balance Static Sitting - Balance Support: Right upper extremity supported;Left upper extremity supported Static Sitting - Level of Assistance: 4: Min Oncologist Standing - Balance Support: Right upper extremity supported;Left upper extremity supported Static Standing - Level of Assistance: 2: Max assist  End of Session OT - End of Session Activity Tolerance: Patient limited by pain;Patient limited by fatigue Patient left: in chair;with call bell/phone within reach Nurse Communication: Mobility status     Dereon Williamsen OTR/L Pager number [phone] 08/16/2011, 9:05 AM

## 2011-08-16 NOTE — Progress Notes (Signed)
Pt encouraged to take PO; waiting for pt to void to d/c to SNF.

## 2012-11-19 ENCOUNTER — Ambulatory Visit: Payer: Self-pay | Admitting: Podiatry

## 2012-12-10 ENCOUNTER — Ambulatory Visit: Payer: Self-pay | Admitting: Podiatry

## 2013-01-30 ENCOUNTER — Encounter: Payer: Self-pay | Admitting: Podiatry

## 2013-01-30 ENCOUNTER — Ambulatory Visit (INDEPENDENT_AMBULATORY_CARE_PROVIDER_SITE_OTHER): Payer: Medicare Other | Admitting: Podiatry

## 2013-01-30 VITALS — BP 148/80 | HR 65 | Resp 16 | Ht 62.0 in | Wt 122.0 lb

## 2013-01-30 DIAGNOSIS — B351 Tinea unguium: Secondary | ICD-10-CM

## 2013-01-30 DIAGNOSIS — M79609 Pain in unspecified limb: Secondary | ICD-10-CM

## 2013-01-30 NOTE — Progress Notes (Signed)
She presents today with a chief complaint of painful ingrown nails to toes one through 5 bilateral.  Objective: Vital signs are stable she is alert and oriented x3. Nails are thick yellow dystrophic clinically mycotic and painful palpation as well as debridement.  Assessment: Pain in limb secondary to onychomycosis 1 through 5 bilateral.  Plan: Debridemen in thickness and in tlength as cover service secondary to pain.

## 2013-02-03 ENCOUNTER — Telehealth: Payer: Self-pay | Admitting: *Deleted

## 2013-02-03 NOTE — Telephone Encounter (Signed)
Mr [name] states that the listing of the pt's medication are not correct.  I left a message asking for a return call to go over the pt's medications.

## 2013-04-23 ENCOUNTER — Emergency Department (HOSPITAL_COMMUNITY): Payer: Medicare Other

## 2013-04-23 ENCOUNTER — Encounter (HOSPITAL_COMMUNITY): Payer: Self-pay | Admitting: Emergency Medicine

## 2013-04-23 ENCOUNTER — Inpatient Hospital Stay (HOSPITAL_COMMUNITY)
Admission: EM | Admit: 2013-04-23 | Discharge: 2013-04-28 | DRG: 308 | Disposition: A | Discharge status: Home / Self Care | Payer: Medicare Other | Attending: Cardiology | Admitting: Cardiology

## 2013-04-23 DIAGNOSIS — F3289 Other specified depressive episodes: Secondary | ICD-10-CM | POA: Diagnosis present

## 2013-04-23 DIAGNOSIS — E8809 Other disorders of plasma-protein metabolism, not elsewhere classified: Secondary | ICD-10-CM | POA: Diagnosis present

## 2013-04-23 DIAGNOSIS — R0682 Tachypnea, not elsewhere classified: Secondary | ICD-10-CM

## 2013-04-23 DIAGNOSIS — I369 Nonrheumatic tricuspid valve disorder, unspecified: Secondary | ICD-10-CM

## 2013-04-23 DIAGNOSIS — I509 Heart failure, unspecified: Secondary | ICD-10-CM | POA: Diagnosis present

## 2013-04-23 DIAGNOSIS — R609 Edema, unspecified: Secondary | ICD-10-CM

## 2013-04-23 DIAGNOSIS — Z950 Presence of cardiac pacemaker: Secondary | ICD-10-CM

## 2013-04-23 DIAGNOSIS — I2789 Other specified pulmonary heart diseases: Secondary | ICD-10-CM | POA: Diagnosis present

## 2013-04-23 DIAGNOSIS — I10 Essential (primary) hypertension: Secondary | ICD-10-CM | POA: Diagnosis present

## 2013-04-23 DIAGNOSIS — E785 Hyperlipidemia, unspecified: Secondary | ICD-10-CM | POA: Diagnosis present

## 2013-04-23 DIAGNOSIS — I359 Nonrheumatic aortic valve disorder, unspecified: Secondary | ICD-10-CM | POA: Diagnosis present

## 2013-04-23 DIAGNOSIS — F329 Major depressive disorder, single episode, unspecified: Secondary | ICD-10-CM | POA: Diagnosis present

## 2013-04-23 DIAGNOSIS — Z96649 Presence of unspecified artificial hip joint: Secondary | ICD-10-CM

## 2013-04-23 DIAGNOSIS — E039 Hypothyroidism, unspecified: Secondary | ICD-10-CM | POA: Diagnosis present

## 2013-04-23 DIAGNOSIS — R011 Cardiac murmur, unspecified: Secondary | ICD-10-CM | POA: Diagnosis present

## 2013-04-23 DIAGNOSIS — I350 Nonrheumatic aortic (valve) stenosis: Secondary | ICD-10-CM | POA: Diagnosis present

## 2013-04-23 DIAGNOSIS — I700 Atherosclerosis of aorta: Secondary | ICD-10-CM | POA: Diagnosis present

## 2013-04-23 DIAGNOSIS — I5033 Acute on chronic diastolic (congestive) heart failure: Secondary | ICD-10-CM | POA: Diagnosis present

## 2013-04-23 DIAGNOSIS — I272 Pulmonary hypertension, unspecified: Secondary | ICD-10-CM

## 2013-04-23 DIAGNOSIS — G4700 Insomnia, unspecified: Secondary | ICD-10-CM | POA: Diagnosis present

## 2013-04-23 DIAGNOSIS — R600 Localized edema: Secondary | ICD-10-CM

## 2013-04-23 DIAGNOSIS — I4891 Unspecified atrial fibrillation: Principal | ICD-10-CM | POA: Diagnosis present

## 2013-04-23 DIAGNOSIS — M1990 Unspecified osteoarthritis, unspecified site: Secondary | ICD-10-CM | POA: Diagnosis present

## 2013-04-23 DIAGNOSIS — Z7901 Long term (current) use of anticoagulants: Secondary | ICD-10-CM

## 2013-04-23 DIAGNOSIS — Z818 Family history of other mental and behavioral disorders: Secondary | ICD-10-CM

## 2013-04-23 LAB — TSH: TSH: 5.63 u[IU]/mL — ABNORMAL HIGH (ref 0.350–4.500)

## 2013-04-23 LAB — CBC WITH DIFFERENTIAL/PLATELET
BASOS ABS: 0 10*3/uL (ref 0.0–0.1)
BASOS PCT: 0 % (ref 0–1)
BASOS PCT: 0 % (ref 0–1)
Basophils Absolute: 0 10*3/uL (ref 0.0–0.1)
Eosinophils Absolute: 0.1 10*3/uL (ref 0.0–0.7)
Eosinophils Absolute: 0.1 10*3/uL (ref 0.0–0.7)
Eosinophils Relative: 1 % (ref 0–5)
Eosinophils Relative: 1 % (ref 0–5)
HCT: 41.6 % (ref 36.0–46.0)
HCT: 41.5 % (ref 36.0–46.0)
Hemoglobin: 13.2 g/dL (ref 12.0–15.0)
Hemoglobin: 13 g/dL (ref 12.0–15.0)
Lymphocytes Relative: 16 % (ref 12–46)
Lymphocytes Relative: 20 % (ref 12–46)
Lymphs Abs: 1.2 10*3/uL (ref 0.7–4.0)
Lymphs Abs: 1.3 10*3/uL (ref 0.7–4.0)
MCH: 27.8 pg (ref 26.0–34.0)
MCH: 27.5 pg (ref 26.0–34.0)
MCHC: 31.7 g/dL (ref 30.0–36.0)
MCHC: 31.3 g/dL (ref 30.0–36.0)
MCV: 87.8 fL (ref 78.0–100.0)
MCV: 87.9 fL (ref 78.0–100.0)
Monocytes Absolute: 0.6 10*3/uL (ref 0.1–1.0)
Monocytes Absolute: 0.5 10*3/uL (ref 0.1–1.0)
Monocytes Relative: 8 % (ref 3–12)
Monocytes Relative: 8 % (ref 3–12)
NEUTROS ABS: 4.4 10*3/uL (ref 1.7–7.7)
NEUTROS PCT: 75 % (ref 43–77)
NEUTROS PCT: 71 % (ref 43–77)
Neutro Abs: 5.3 10*3/uL (ref 1.7–7.7)
Platelets: 188 10*3/uL (ref 150–400)
Platelets: 175 10*3/uL (ref 150–400)
RBC: 4.74 MIL/uL (ref 3.87–5.11)
RBC: 4.72 MIL/uL (ref 3.87–5.11)
RDW: 15.6 % — AB (ref 11.5–15.5)
RDW: 15.7 % — ABNORMAL HIGH (ref 11.5–15.5)
WBC: 7.1 10*3/uL (ref 4.0–10.5)
WBC: 6.3 10*3/uL (ref 4.0–10.5)

## 2013-04-23 LAB — PROTIME-INR
INR: 2.95 — ABNORMAL HIGH (ref 0.00–1.49)
Prothrombin Time: 29.7 seconds — ABNORMAL HIGH (ref 11.6–15.2)

## 2013-04-23 LAB — URINE MICROSCOPIC-ADD ON

## 2013-04-23 LAB — LIPID PANEL
CHOL/HDL RATIO: 2.8 ratio
Cholesterol: 125 mg/dL (ref 0–200)
HDL: 45 mg/dL (ref >39)
LDL CALC: 63 mg/dL (ref 0–99)
Triglycerides: 86 mg/dL (ref <150)
VLDL: 17 mg/dL (ref 0–40)

## 2013-04-23 LAB — PRO B NATRIURETIC PEPTIDE
Pro B Natriuretic peptide (BNP): 5785 pg/mL — ABNORMAL HIGH (ref 0–450)
Pro B Natriuretic peptide (BNP): 5289 pg/mL — ABNORMAL HIGH (ref 0–450)

## 2013-04-23 LAB — URINALYSIS, ROUTINE W REFLEX MICROSCOPIC
Bilirubin Urine: NEGATIVE
GLUCOSE, UA: NEGATIVE mg/dL
KETONES UR: NEGATIVE mg/dL
Leukocytes, UA: NEGATIVE
Nitrite: NEGATIVE
PROTEIN: NEGATIVE mg/dL
Specific Gravity, Urine: 1.015 (ref 1.005–1.030)
UROBILINOGEN UA: 0.2 mg/dL (ref 0.0–1.0)
pH: 6.5 (ref 5.0–8.0)

## 2013-04-23 LAB — COMPREHENSIVE METABOLIC PANEL
ALBUMIN: 3.1 g/dL — AB (ref 3.5–5.2)
ALK PHOS: 69 U/L (ref 39–117)
ALT: 37 U/L — ABNORMAL HIGH (ref 0–35)
ALT: 33 U/L (ref 0–35)
AST: 40 U/L — ABNORMAL HIGH (ref 0–37)
AST: 36 U/L (ref 0–37)
Albumin: 2.9 g/dL — ABNORMAL LOW (ref 3.5–5.2)
Alkaline Phosphatase: 66 U/L (ref 39–117)
BILIRUBIN TOTAL: 0.5 mg/dL (ref 0.3–1.2)
BILIRUBIN TOTAL: 0.5 mg/dL (ref 0.3–1.2)
BUN: 39 mg/dL — ABNORMAL HIGH (ref 6–23)
BUN: 36 mg/dL — ABNORMAL HIGH (ref 6–23)
CALCIUM: 8.2 mg/dL — AB (ref 8.4–10.5)
CHLORIDE: 103 meq/L (ref 96–112)
CO2: 27 mEq/L (ref 19–32)
CO2: 29 mEq/L (ref 19–32)
CREATININE: 1.14 mg/dL — AB (ref 0.50–1.10)
Calcium: 8.9 mg/dL (ref 8.4–10.5)
Chloride: 103 mEq/L (ref 96–112)
Creatinine, Ser: 1.3 mg/dL — ABNORMAL HIGH (ref 0.50–1.10)
GFR calc Af Amer: 47 mL/min — ABNORMAL LOW (ref >90)
GFR, EST AFRICAN AMERICAN: 40 mL/min — AB (ref >90)
GFR, EST NON AFRICAN AMERICAN: 40 mL/min — AB (ref >90)
GFR, EST NON AFRICAN AMERICAN: 35 mL/min — AB (ref >90)
Glucose, Bld: 95 mg/dL (ref 70–99)
Glucose, Bld: 90 mg/dL (ref 70–99)
Potassium: 4.2 mEq/L (ref 3.7–5.3)
Potassium: 4.6 mEq/L (ref 3.7–5.3)
SODIUM: 145 meq/L (ref 137–147)
SODIUM: 144 meq/L (ref 137–147)
Total Protein: 6.2 g/dL (ref 6.0–8.3)
Total Protein: 5.9 g/dL — ABNORMAL LOW (ref 6.0–8.3)

## 2013-04-23 LAB — GLUCOSE, CAPILLARY
Glucose-Capillary: 117 mg/dL — ABNORMAL HIGH (ref 70–99)
Glucose-Capillary: 136 mg/dL — ABNORMAL HIGH (ref 70–99)

## 2013-04-23 LAB — TROPONIN I: Troponin I: <0.3 ng/mL (ref <0.30)

## 2013-04-23 LAB — HEMOGLOBIN A1C
Hgb A1c MFr Bld: 5.9 % — ABNORMAL HIGH (ref <5.7)
Mean Plasma Glucose: 123 mg/dL — ABNORMAL HIGH (ref <117)

## 2013-04-23 LAB — MAGNESIUM
MAGNESIUM: 2.2 mg/dL (ref 1.5–2.5)
Magnesium: 2.2 mg/dL (ref 1.5–2.5)

## 2013-04-23 LAB — T4, FREE: Free T4: 1.79 ng/dL (ref 0.80–1.80)

## 2013-04-23 MED ORDER — WARFARIN SODIUM 2.5 MG PO TABS
2.5000 mg | ORAL_TABLET | Freq: Once | ORAL | Status: DC
  Filled 2013-04-23: qty 1

## 2013-04-23 MED ORDER — LEVOTHYROXINE SODIUM 100 MCG PO TABS
100.0000 ug | ORAL_TABLET | Freq: Every day | ORAL | Status: DC
  Administered 2013-04-23 – 2013-04-28 (×6): 100 ug via ORAL
  Filled 2013-04-23 (×7): qty 1

## 2013-04-23 MED ORDER — METOPROLOL SUCCINATE ER 50 MG PO TB24
50.0000 mg | ORAL_TABLET | Freq: Every day | ORAL | Status: DC
  Filled 2013-04-23: qty 1

## 2013-04-23 MED ORDER — ONDANSETRON HCL 4 MG/2ML IJ SOLN: 4.0000 mg | Freq: Four times a day (QID) | INTRAMUSCULAR | Status: DC | PRN

## 2013-04-23 MED ORDER — ENSURE PUDDING PO PUDG
1.0000 | Freq: Three times a day (TID) | ORAL | Status: DC
  Administered 2013-04-23 – 2013-04-28 (×15): 1 via ORAL

## 2013-04-23 MED ORDER — ASPIRIN 81 MG PO CHEW
324.0000 mg | CHEWABLE_TABLET | ORAL | Status: AC
  Administered 2013-04-23: 324 mg via ORAL
  Filled 2013-04-23: qty 4

## 2013-04-23 MED ORDER — FUROSEMIDE 10 MG/ML IJ SOLN
20.0000 mg | Freq: Every day | INTRAMUSCULAR | Status: DC
  Administered 2013-04-23 – 2013-04-28 (×6): 20 mg via INTRAVENOUS
  Filled 2013-04-23 (×5): qty 2

## 2013-04-23 MED ORDER — ASPIRIN 300 MG RE SUPP: 300.0000 mg | RECTAL | Status: AC

## 2013-04-23 MED ORDER — SODIUM CHLORIDE 0.9 % IV SOLN
INTRAVENOUS | Status: DC
  Administered 2013-04-23 – 2013-04-24 (×2): via INTRAVENOUS

## 2013-04-23 MED ORDER — TRAZODONE 25 MG HALF TABLET
25.0000 mg | ORAL_TABLET | Freq: Every day | ORAL | Status: DC
  Administered 2013-04-23 – 2013-04-27 (×5): 25 mg via ORAL
  Filled 2013-04-23 (×7): qty 1

## 2013-04-23 MED ORDER — FUROSEMIDE 10 MG/ML IJ SOLN
20.0000 mg | Freq: Once | INTRAMUSCULAR | Status: AC
  Administered 2013-04-23: 20 mg via INTRAVENOUS
  Filled 2013-04-23: qty 2

## 2013-04-23 MED ORDER — AMIODARONE HCL 200 MG PO TABS
200.0000 mg | ORAL_TABLET | Freq: Every day | ORAL | Status: DC
  Filled 2013-04-23: qty 1

## 2013-04-23 MED ORDER — METOPROLOL SUCCINATE ER 25 MG PO TB24
25.0000 mg | ORAL_TABLET | Freq: Every day | ORAL | Status: DC
  Administered 2013-04-24 – 2013-04-28 (×5): 25 mg via ORAL
  Filled 2013-04-23 (×7): qty 1

## 2013-04-23 MED ORDER — NITROGLYCERIN 0.4 MG SL SUBL: 0.4000 mg | SUBLINGUAL_TABLET | SUBLINGUAL | Status: DC | PRN

## 2013-04-23 MED ORDER — ASPIRIN EC 81 MG PO TBEC: 81.0000 mg | DELAYED_RELEASE_TABLET | Freq: Every day | ORAL | Status: DC

## 2013-04-23 MED ORDER — POTASSIUM CHLORIDE CRYS ER 20 MEQ PO TBCR
20.0000 meq | EXTENDED_RELEASE_TABLET | Freq: Every day | ORAL | Status: DC
Start: 2013-04-23 — End: 2013-04-28
  Administered 2013-04-23 – 2013-04-28 (×6): 20 meq via ORAL
  Filled 2013-04-23 (×7): qty 1

## 2013-04-23 MED ORDER — AMIODARONE HCL 200 MG PO TABS
400.0000 mg | ORAL_TABLET | Freq: Two times a day (BID) | ORAL | Status: DC
  Administered 2013-04-23 – 2013-04-25 (×4): 400 mg via ORAL
  Filled 2013-04-23 (×5): qty 2

## 2013-04-23 MED ORDER — ACETAMINOPHEN 325 MG PO TABS: 650.0000 mg | ORAL_TABLET | ORAL | Status: DC | PRN

## 2013-04-23 MED ORDER — WARFARIN - PHARMACIST DOSING INPATIENT: Freq: Every day | Status: DC

## 2013-04-23 MED ORDER — DIGOXIN 0.25 MG/ML IJ SOLN
0.1250 mg | Freq: Every day | INTRAMUSCULAR | Status: DC
  Administered 2013-04-23 – 2013-04-24 (×2): 0.125 mg via INTRAVENOUS
  Filled 2013-04-23 (×4): qty 0.5

## 2013-04-23 NOTE — Consult Note (Signed)
Reason for Consult: Recurrent A. fib with RVR/CHF Referring Physician: [facility]  [name] is an 80 y.o. female.  HPI: Patient is 80 year old female with past medical history significant for severe aortic stenosis, hypertension, history of A. fib with RVR in the past, cholesteremia, hypothyroidism, tachybradycardia syndrome status post permanent pacemaker, degenerative joint disease, history of left hip fracture in the past, came to the ER complaining of progressive increasing shortness of breath associated with fatigue and leg swelling for last few weeks. Patient states she was being followed at [facility] in [place] and was admitted for approximately one week last week and had attempted cardioversion x3 without success. States seen a cardiologist and on interrogation of the pacemaker was noted to have flipped back in A. fib since December of 2014.Marland Kitchen Patient denies any chest pain nausea vomiting diaphoresis. Denies any PND orthopnea but complains of leg swelling. Denies any syncopal episode. States once her restarted on amiodarone and Lasix with slight improvement in her leg swelling.  Past Medical History  Diagnosis Date  . Pacemaker   . A-fib   . Hypertension   . Hypothyroidism   . Hyperlipemia     Past Surgical History  Procedure Laterality Date  . Humerus fracture surgery    . Hip arthroplasty  08/11/2011    Procedure: ARTHROPLASTY BIPOLAR HIP;  Surgeon: Mauri Pole, MD;  Location: [facility];  Service: Orthopedics;  Laterality: Left;    Family History  Problem Relation Age of Onset  . Bipolar disorder Mother     Social History:  reports that she has never smoked. She has never used smokeless tobacco. She reports that she drinks alcohol. She reports that she does not use illicit drugs.  Allergies: No Known Allergies  Medications: I have reviewed the patient's current medications.  Results for orders placed during the hospital encounter of 04/23/13 (from the past 48 hour(s))   CBC WITH DIFFERENTIAL     Status: Abnormal   Collection Time    04/23/13  6:45 AM      Result Value Ref Range   WBC 7.1  4.0 - 10.5 K/uL   RBC 4.74  3.87 - 5.11 MIL/uL   Hemoglobin 13.2  12.0 - 15.0 g/dL   HCT 41.6  36.0 - 46.0 %   MCV 87.8  78.0 - 100.0 fL   MCH 27.8  26.0 - 34.0 pg   MCHC 31.7  30.0 - 36.0 g/dL   RDW 15.6 (*) 11.5 - 15.5 %   Platelets 188  150 - 400 K/uL   Neutrophils Relative % 75  43 - 77 %   Neutro Abs 5.3  1.7 - 7.7 K/uL   Lymphocytes Relative 16  12 - 46 %   Lymphs Abs 1.2  0.7 - 4.0 K/uL   Monocytes Relative 8  3 - 12 %   Monocytes Absolute 0.6  0.1 - 1.0 K/uL   Eosinophils Relative 1  0 - 5 %   Eosinophils Absolute 0.1  0.0 - 0.7 K/uL   Basophils Relative 0  0 - 1 %   Basophils Absolute 0.0  0.0 - 0.1 K/uL  TROPONIN I     Status: None   Collection Time    04/23/13  6:45 AM      Result Value Ref Range   Troponin I <0.30  <0.30 ng/mL   Comment:            Due to the release kinetics of cTnI,     a  negative result within the first hours     of the onset of symptoms does not rule out     myocardial infarction with certainty.     If myocardial infarction is still suspected,     repeat the test at appropriate intervals.  COMPREHENSIVE METABOLIC PANEL     Status: Abnormal   Collection Time    04/23/13  6:45 AM      Result Value Ref Range   Sodium 145  137 - 147 mEq/L   Potassium 4.2  3.7 - 5.3 mEq/L   Chloride 103  96 - 112 mEq/L   CO2 27  19 - 32 mEq/L   Glucose, Bld 95  70 - 99 mg/dL   BUN 39 (*) 6 - 23 mg/dL   Creatinine, Ser 1.14 (*) 0.50 - 1.10 mg/dL   Calcium 8.9  8.4 - 10.5 mg/dL   Total Protein 6.2  6.0 - 8.3 g/dL   Albumin 3.1 (*) 3.5 - 5.2 g/dL   AST 40 (*) 0 - 37 U/L   ALT 37 (*) 0 - 35 U/L   Alkaline Phosphatase 69  39 - 117 U/L   Total Bilirubin 0.5  0.3 - 1.2 mg/dL   GFR calc non Af Amer 40 (*) >90 mL/min   GFR calc Af Amer 47 (*) >90 mL/min   Comment: (NOTE)     The eGFR has been calculated using the CKD EPI equation.      This calculation has not been validated in all clinical situations.     eGFR's persistently <90 mL/min signify possible Chronic Kidney     Disease.  PRO B NATRIURETIC PEPTIDE     Status: Abnormal   Collection Time    04/23/13  6:45 AM      Result Value Ref Range   Pro B Natriuretic peptide (BNP) 5785.0 (*) 0 - 450 pg/mL  PROTIME-INR     Status: Abnormal   Collection Time    04/23/13  6:45 AM      Result Value Ref Range   Prothrombin Time 29.7 (*) 11.6 - 15.2 seconds   INR 2.95 (*) 0.00 - 1.49  URINALYSIS, ROUTINE W REFLEX MICROSCOPIC     Status: Abnormal   Collection Time    04/23/13  9:03 AM      Result Value Ref Range   Color, Urine YELLOW  YELLOW   APPearance CLEAR  CLEAR   Specific Gravity, Urine 1.015  1.005 - 1.030   pH 6.5  5.0 - 8.0   Glucose, UA NEGATIVE  NEGATIVE mg/dL   Hgb urine dipstick SMALL (*) NEGATIVE   Bilirubin Urine NEGATIVE  NEGATIVE   Ketones, ur NEGATIVE  NEGATIVE mg/dL   Protein, ur NEGATIVE  NEGATIVE mg/dL   Urobilinogen, UA 0.2  0.0 - 1.0 mg/dL   Nitrite NEGATIVE  NEGATIVE   Leukocytes, UA NEGATIVE  NEGATIVE  URINE MICROSCOPIC-ADD ON     Status: None   Collection Time    04/23/13  9:03 AM      Result Value Ref Range   Squamous Epithelial / LPF RARE  RARE   WBC, UA 0-2  <3 WBC/hpf   RBC / HPF 0-2  <3 RBC/hpf   Bacteria, UA RARE  RARE  TROPONIN I     Status: None   Collection Time    04/23/13  9:49 AM      Result Value Ref Range   Troponin I <0.30  <0.30 ng/mL   Comment:  Due to the release kinetics of cTnI,     a negative result within the first hours     of the onset of symptoms does not rule out     myocardial infarction with certainty.     If myocardial infarction is still suspected,     repeat the test at appropriate intervals.  CBC WITH DIFFERENTIAL     Status: Abnormal   Collection Time    04/23/13  9:49 AM      Result Value Ref Range   WBC 6.3  4.0 - 10.5 K/uL   RBC 4.72  3.87 - 5.11 MIL/uL   Hemoglobin 13.0  12.0 -  15.0 g/dL   HCT 41.5  36.0 - 46.0 %   MCV 87.9  78.0 - 100.0 fL   MCH 27.5  26.0 - 34.0 pg   MCHC 31.3  30.0 - 36.0 g/dL   RDW 15.7 (*) 11.5 - 15.5 %   Platelets 175  150 - 400 K/uL   Neutrophils Relative % 71  43 - 77 %   Neutro Abs 4.4  1.7 - 7.7 K/uL   Lymphocytes Relative 20  12 - 46 %   Lymphs Abs 1.3  0.7 - 4.0 K/uL   Monocytes Relative 8  3 - 12 %   Monocytes Absolute 0.5  0.1 - 1.0 K/uL   Eosinophils Relative 1  0 - 5 %   Eosinophils Absolute 0.1  0.0 - 0.7 K/uL   Basophils Relative 0  0 - 1 %   Basophils Absolute 0.0  0.0 - 0.1 K/uL  MAGNESIUM     Status: None   Collection Time    04/23/13  9:49 AM      Result Value Ref Range   Magnesium 2.2  1.5 - 2.5 mg/dL  PRO B NATRIURETIC PEPTIDE     Status: Abnormal   Collection Time    04/23/13  9:49 AM      Result Value Ref Range   Pro B Natriuretic peptide (BNP) 5289.0 (*) 0 - 450 pg/mL  LIPID PANEL     Status: None   Collection Time    04/23/13  9:49 AM      Result Value Ref Range   Cholesterol 125  0 - 200 mg/dL   Triglycerides 86  <150 mg/dL   HDL 45  >39 mg/dL   Total CHOL/HDL Ratio 2.8     VLDL 17  0 - 40 mg/dL   LDL Cholesterol 63  0 - 99 mg/dL   Comment:            Total Cholesterol/HDL:CHD Risk     Coronary Heart Disease Risk Table                         Men   Women      1/2 Average Risk   3.4   3.3      Average Risk       5.0   4.4      2 X Average Risk   9.6   7.1      3 X Average Risk  23.4   11.0                Use the calculated Patient Ratio     above and the CHD Risk Table     to determine the patient's CHD Risk.  ATP III CLASSIFICATION (LDL):      <100     mg/dL   Optimal      100-129  mg/dL   Near or Above                        Optimal      130-159  mg/dL   Borderline      160-189  mg/dL   High      >190     mg/dL   Very High    Dg Chest 2 View  04/23/2013   CLINICAL DATA:  Atrial fibrillation and difficulty breathing  EXAM: CHEST  2 VIEW  COMPARISON:  None.  FINDINGS: There  are small pleural effusions bilaterally with mild interstitial edema. There is no airspace consolidation. Heart is enlarged with mild pulmonary venous hypertension. No adenopathy. Pacemaker leads are attached to the right atrium and right ventricle. No pneumothorax. There is degenerative change in the thoracic spine. There is atherosclerotic change in the aorta.  IMPRESSION: Evidence of a degree of congestive heart failure. No appreciable airspace consolidation.   Electronically Signed   By: Lowella Grip M.D.   On: 04/23/2013 07:20    Review of Systems  Constitutional: Negative for fever and chills.  HENT: Negative for hearing loss.   Eyes: Negative for blurred vision, double vision and photophobia.  Respiratory: Negative for cough, hemoptysis and sputum production.   Cardiovascular: Positive for leg swelling. Negative for chest pain and palpitations.  Gastrointestinal: Negative for nausea and abdominal pain.  Genitourinary: Negative for dysuria.  Neurological: Negative for dizziness and headaches.   Blood pressure 125/90, pulse 102, temperature 97.3 F (36.3 C), temperature source Oral, resp. rate 18, height _0  (1.575 m), weight 59.194 kg (130 lb 8 oz), SpO2 96.00%. Physical Exam  Constitutional: She is oriented to person, place, and time.  HENT:  Head: Normocephalic and atraumatic.  Eyes: Conjunctivae are normal. Left eye exhibits no discharge. No scleral icterus.  Neck: Normal range of motion. Neck supple. JVD present. No tracheal deviation present. No thyromegaly present.  Cardiovascular:  Irregularly irregular there is 3/6 systolic murmur and soft diastolic murmur noted. Soft S3 gallop noted  Respiratory:  Decrease breath sound at bases with fine rales.  GI: Soft. Bowel sounds are normal. She exhibits no distension. There is no tenderness. There is no rebound.  Musculoskeletal:  No clubbing cyanosis 3+ edema noted  Neurological: She is alert and oriented to person, place, and  time.    Assessment/Plan: Recurrent persistent A. fib with RVR Decompensated congestive heart failure secondary to sternal systolic function Severe headache stenosis Hypertension Tachybradycardia syndrome status post permanent pacemaker Hypothyroidism Hypercholesteremia Degenerative joint disease History of left hip fracture in the past Plan Hold Coumadin for now. Increase amiodarone as per orders Add low-dose dig and Lasix Monitor PT/INR closely as an increasing dose of amiodarone and adding digoxin We'll keep INR close to 2. Will monitor PT/INR daily and pharmacy to manage Coumadin dose. Discussed with son and agree with medical management only.  Jaide Hillenburg N 04/23/2013, 12:11 PM

## 2013-04-23 NOTE — Progress Notes (Signed)
ANTICOAGULATION CONSULT NOTE - Initial Consult  Pharmacy Consult for Coumadin Indication: atrial fibrillation  No Known Allergies  Patient Measurements:    Vital Signs: Temp: 98.9 F (37.2 C) (04/01 0630) Temp src: Oral (04/01 0630) BP: 109/91 mmHg (04/01 0900) Pulse Rate: 97 (04/01 0900)  Labs:  Recent Labs  04/23/13 0645  HGB 13.2  HCT 41.6  PLT 188  LABPROT 29.7*  INR 2.95*  CREATININE 1.14*  TROPONINI <0.30    The CrCl is unknown because both a height and weight (above a minimum accepted value) are required for this calculation.   Medical History: Past Medical History  Diagnosis Date  . Pacemaker   . A-fib   . Hypertension   . Hypothyroidism   . Hyperlipemia     Medications:  See electronic med rec  Assessment: 80 y.o. female presents with SOB, fatigue. Pt on coumadin PTA for afib (home dose 2.5mg  daily - last taken yesterday). INR 2.95- therapeutic. CBC stable at baseline.  Goal of Therapy:  INR 2-3 Monitor platelets by anticoagulation protocol: Yes   Plan:  1. Daily INR 2. Coumadin 2.5mg  daily  Christoper Fabianaron Zarek Relph, PharmD, BCPS Clinical pharmacist, pager [phone] 04/23/2013,9:39 AM

## 2013-04-23 NOTE — H&P (Signed)
[facility] History and Physical  [name] ZOX:[id number] DOB: 1918/06/05 DOA: 04/23/2013  Referring physician:  PCP: PROVIDER NOT IN SYSTEM  Specialists:   Chief Complaint: SOB, Fatigue   HPI: [name] is a 80 y.o. WF PMHx severe aortic stenosis, cardiac murmur, atrial fibrillation with RVR on chronic anticoagulation, pulmonary hypertension, hypothyroidism  Pt states that over the last 6 weeks she has had increasing sob and fatigue. Pt states that the symptoms started after being a weak persistent vomiting and diarrhea. Pt states that she was recently in the hospital in [place] and they restarted her pacerone and lasix, but when she saw her doctors yesterday they told her that she probably needed to be admitted to the hospital again. Pt denies cp. Pt states that when she was in the hospital they tried cardioversion 3 times for her atrial fib without success. Pt states that she has had swelling in her lower extremities, but it is getting better as she can now put her shoes on again. Pt states that she has not had a cough,cp or fever. Pt sees Dr. Rinaldo Cloud (cardiologist). Patient states her baseline weight is 122 pounds. States cardioversion of her A. fib was attempted at [facility] last week on Wednesday/Thursday/Friday unsuccessfully.   Review of Systems: The patient denies anorexia, fever, weight loss,, vision loss, decreased hearing, hoarseness, chest pain, syncope, balance deficits, hemoptysis, abdominal pain, melena, hematochezia, severe indigestion/heartburn, hematuria, incontinence, genital sores, muscle weakness, suspicious skin lesions, transient blindness, difficulty walking, depression, unusual weight change, abnormal bleeding, enlarged lymph nodes, angioedema, and breast masses.    TRAVEL HISTORY:   Procedure Echocardiogram 08/10/2011 - Left ventricle: The cavity size was normal. Mild concentric hypertrophy.  -LVEF= 50% to 55%.  - (grade 1  diastolic dysfunction). - Aortic valve: Severely calcified annulus. Trileaflet; severely calcified leaflets. Severe stenosis. Mild regurgitation.  - Atrial septum: No defect or patent foramen ovale was identified. - Pulmonary arteries: PA peak pressure: 54mm Hg (S). - Pericardium, extracardiac: A trivial pericardial effusion      Antibiotics     Consultation  Dr. Rinaldo Cloud (cardiologist).      Past Medical History  Diagnosis Date  . Pacemaker   . A-fib   . Hypertension   . Hypothyroidism   . Hyperlipemia    Past Surgical History  Procedure Laterality Date  . Humerus fracture surgery    . Hip arthroplasty  08/11/2011    Procedure: ARTHROPLASTY BIPOLAR HIP;  Surgeon: Shelda Pal, MD;  Location: [facility] OR;  Service: Orthopedics;  Laterality: Left;   Social History:  reports that she has never smoked. She has never used smokeless tobacco. She reports that she drinks alcohol. She reports that she does not use illicit drugs. where does patient live--home, ALF, SNF? Home alone but son lives 2 doors away Can patient participate in ADLs? Yes  No Known Allergies  Family History  Problem Relation Age of Onset  . Bipolar disorder Mother      Prior to Admission medications   Medication Sig Start Date End Date Taking? Authorizing Provider  amiodarone (PACERONE) 200 MG tablet Take 1 tablet (200 mg total) by mouth daily. 09/07/11 04/23/13 Yes Dawood Elgergawy, MD  furosemide (LASIX) 40 MG tablet Take 40 mg by mouth daily.   Yes Historical Provider, MD  levothyroxine (SYNTHROID, LEVOTHROID) 100 MCG tablet Take 100 mcg by mouth daily.   Yes Historical Provider, MD  metoprolol succinate (TOPROL-XL) 100 MG 24 hr tablet Take 50 mg by  mouth daily. Take with or immediately following a meal.   Yes Historical Provider, MD  traZODone (DESYREL) 50 MG tablet Take 25 mg by mouth at bedtime.   Yes Historical Provider, MD  warfarin (COUMADIN) 2.5 MG tablet Take 2.5 mg by mouth daily.   Yes  Historical Provider, MD   Physical Exam: Filed Vitals:   04/23/13 0915 04/23/13 0930 04/23/13 0945 04/23/13 1052  BP: 121/80 117/82  125/90  Pulse: 84 90 64 102  Temp:    97.3 F (36.3 C)  TempSrc:    Oral  Resp: 17 18 28 18   Height:    5\' 2"  (1.575 m)  Weight:    59.194 kg (130 lb 8 oz)  SpO2: 99% 98% 100% 96%     General: A./O. x4  Eyes: Pupils equal reactive to light and accommodation  Neck: Negative JVD, negative lymphadenopathy  Cardiovascular: Irregular irregular rhythm and rate, +3/6 systolic murmur, positive gallop, negative rubs  Respiratory: Positive diffuse expiratory wheezing, positive bibasilar rales  Abdomen: Soft, nontender, nondistended, plus bowel sounds  Skin: Negative lesions/lacerations  Musculoskeletal: 3+ edema to hips bilateral  Neurologic: Cranial nerves II through XII intact, extremity strength 5/5, sensation intact throughout did not ambulate patient.  Labs on Admission:  Basic Metabolic Panel:  Recent Labs Lab 04/23/13 0645 04/23/13 0949  NA 145  --   K 4.2  --   CL 103  --   CO2 27  --   GLUCOSE 95  --   BUN 39*  --   CREATININE 1.14*  --   CALCIUM 8.9  --   MG  --  2.2   Liver Function Tests:  Recent Labs Lab 04/23/13 0645  AST 40*  ALT 37*  ALKPHOS 69  BILITOT 0.5  PROT 6.2  ALBUMIN 3.1*   No results found for this basename: LIPASE, AMYLASE,  in the last 168 hours No results found for this basename: AMMONIA,  in the last 168 hours CBC:  Recent Labs Lab 04/23/13 0645 04/23/13 0949  WBC 7.1 6.3  NEUTROABS 5.3 4.4  HGB 13.2 13.0  HCT 41.6 41.5  MCV 87.8 87.9  PLT 188 175   Cardiac Enzymes:  Recent Labs Lab 04/23/13 0645 04/23/13 0949  TROPONINI <0.30 <0.30    BNP (last 3 results)  Recent Labs  04/23/13 0645 04/23/13 0949  PROBNP 5785.0* 5289.0*   CBG: No results found for this basename: GLUCAP,  in the last 168 hours  Radiological Exams on Admission: Dg Chest 2 View  04/23/2013   CLINICAL  DATA:  Atrial fibrillation and difficulty breathing  EXAM: CHEST  2 VIEW  COMPARISON:  None.  FINDINGS: There are small pleural effusions bilaterally with mild interstitial edema. There is no airspace consolidation. Heart is enlarged with mild pulmonary venous hypertension. No adenopathy. Pacemaker leads are attached to the right atrium and right ventricle. No pneumothorax. There is degenerative change in the thoracic spine. There is atherosclerotic change in the aorta.  IMPRESSION: Evidence of a degree of congestive heart failure. No appreciable airspace consolidation.   Electronically Signed   By: Bretta BangWilliam  Woodruff M.D.   On: 04/23/2013 07:20    EKG:  Atrial fib/flutter, ventricular pacing  Assessment/Plan Principal Problem:   Diastolic CHF, acute on chronic Active Problems:   Atrial fibrillation   Murmur, cardiac   Atrial fibrillation with rapid ventricular response   Chronic anticoagulation   Severe aortic stenosis   Pulmonary hypertension   Unspecified hypothyroidism   Acute on chronic diastolic  CHF decompensated -Per cardiology start Lasix 20 mg daily -Per cardiology increase amiodarone to 400 mg BID -Per cardiology start digoxin IV 0.125 mg daily -Per cardiology reduce metoprolol to 25 mg daily -Obtain troponin x3 -Obtain BNP -Obtain echocardiogram -Strict I&O -Every morning weight on standing scale if possible -Daily CMP/magnesium monitor closely and replace electrolytes appropriately -Coumadin per pharmacy -Admission weight= pending  Pulmonary hypertension -See acute on chronic diastolic CHF  Atrial fibrillation with RVR (on chronic anticoagulation) -See acute on chronic diastolic CHF  Severe aortic stenosis -See acute on chronic diastolic CHF --See acute on chronic diastolic CHF medical management  Hypothyroidism -Obtain TSH/free T4 level  Depression/insomnia -Continue trazodone 25 mg QHS  Tachypnea  -Decompensated acute/chronic diastolic  CHF        Code Status: Full Family Communication: None; per Dr. Rinaldo Cloud (cardiologist) note spoke to son at length about plan of care Disposition Plan: Resolution of decompensated CHF  Time spent: 60 min  WOODS, Roselind Messier [facility] Pager [phone]  If 7PM-7AM, please contact night-coverage [email or web address] Password [email or web address] 04/23/2013, 1:19 PM

## 2013-04-23 NOTE — ED Provider Notes (Signed)
CSN: [id number]     Arrival date & time 04/23/13  0555 History   First MD Initiated Contact with Patient 04/23/13 [phone]     Chief Complaint  Patient presents with  . Atrial Fibrillation  . Fatigue     (Consider location/radiation/quality/duration/timing/severity/associated sxs/prior Treatment) HPI Comments: Pt states that over the last 6 weeks she has had increasing sob and fatigue. Pt states that the symptoms started after being a weak  persistent vomiting and diarrhea. Pt states that she was recently in the hospital in  and they restarted her pacerone and lasix, but when she saw her doctors yesterday they told her that she probably needed to be admitted to the hospital again. Pt denies cp. Pt states that when she was in the hospital they tried cardioversion 3 times for her atrial fib without success. Pt states that she has had swelling in her lower extremities, but it is getting better as she can now put her shoes on again. Pt states that she has not had a cough,cp or fever. Pt sees Dr. Sharyn Lull  The history is provided by the patient and a relative.    Past Medical History  Diagnosis Date  . Pacemaker   . A-fib   . Hypertension   . Hypothyroidism   . Hyperlipemia    Past Surgical History  Procedure Laterality Date  . Humerus fracture surgery    . Hip arthroplasty  08/11/2011    Procedure: ARTHROPLASTY BIPOLAR HIP;  Surgeon: Shelda Pal, MD;  Location: [facility] OR;  Service: Orthopedics;  Laterality: Left;   Family History  Problem Relation Age of Onset  . Bipolar disorder Mother    History  Substance Use Topics  . Smoking status: Never Smoker   . Smokeless tobacco: Never Used  . Alcohol Use: 0.0 oz/week     Comment: once a week, on Sunday   OB History   Grav Para Term Preterm Abortions TAB SAB Ect Mult Living                 Review of Systems  Respiratory: Positive for shortness of breath.   Cardiovascular: Negative for chest pain.  Neurological: Positive for  weakness.      Allergies  Review of patient's allergies indicates no known allergies.  Home Medications   Current Outpatient Rx  Name  Route  Sig  Dispense  Refill  . amiodarone (PACERONE) 200 MG tablet   Oral   Take 1 tablet (200 mg total) by mouth daily.         . furosemide (LASIX) 40 MG tablet   Oral   Take 40 mg by mouth daily.         Marland Kitchen levothyroxine (SYNTHROID, LEVOTHROID) 100 MCG tablet   Oral   Take 100 mcg by mouth daily.         . metoprolol succinate (TOPROL-XL) 100 MG 24 hr tablet   Oral   Take 50 mg by mouth daily. Take with or immediately following a meal.         . traZODone (DESYREL) 50 MG tablet   Oral   Take 25 mg by mouth at bedtime.         Marland Kitchen warfarin (COUMADIN) 2.5 MG tablet   Oral   Take 2.5 mg by mouth daily.          BP 110/72  Pulse 95  Temp(Src) 98.9 F (37.2 C) (Oral)  Resp 22  SpO2 97% Physical Exam  Nursing note and vitals  reviewed. Constitutional: She appears well-developed and well-nourished.  HENT:  Head: Normocephalic and atraumatic.  Cardiovascular: An irregularly irregular rhythm present.  Pulmonary/Chest: She has rales.  Abdominal: Soft. Bowel sounds are normal. There is no tenderness.  Musculoskeletal:  Bilateral pitting edema to lower extremities  Neurological: She is alert.  Skin: Skin is warm and dry.    ED Course  Procedures (including critical care time) Labs Review Labs Reviewed  CBC WITH DIFFERENTIAL - Abnormal; Notable for the following:    RDW 15.6 (*)    All other components within normal limits  COMPREHENSIVE METABOLIC PANEL - Abnormal; Notable for the following:    BUN 39 (*)    Creatinine, Ser 1.14 (*)    Albumin 3.1 (*)    AST 40 (*)    ALT 37 (*)    GFR calc non Af Amer 40 (*)    GFR calc Af Amer 47 (*)    All other components within normal limits  PRO B NATRIURETIC PEPTIDE - Abnormal; Notable for the following:    Pro B Natriuretic peptide (BNP) 5785.0 (*)    All other  components within normal limits  PROTIME-INR - Abnormal; Notable for the following:    Prothrombin Time 29.7 (*)    INR 2.95 (*)    All other components within normal limits  TROPONIN I  URINALYSIS, ROUTINE W REFLEX MICROSCOPIC   Imaging Review No results found.   EKG Interpretation   Date/Time:  Wednesday April 23 2013 06:20:04 EDT Ventricular Rate:  107 PR Interval:    QRS Duration: 106 QT Interval:  347 QTC Calculation: 463 R Axis:   -102 Text Interpretation:  Afib/flut and V-paced complexes No further rhythm  analysis attempted due to paced rhythm Left anterior fascicular block  Anterior infarct, old Poor baseline Confirmed by ZAVITZ  MD, JOSHUA ([id number])  on 04/23/2013 6:37:16 AM      MDM   Final diagnoses:  Atrial fibrillation  Edema of both legs  Heart failure    Spoke with Dr. [name] and he will consult on pt if the hospitalist will admit. [facility] to admit the pt    Teressa LowerVrinda Fitzgerald Dunne, NP 04/23/13 [phone]

## 2013-04-23 NOTE — ED Notes (Signed)
Patient had a viral illness x6 weeks ago which caused patient's HR to become irregular. Patient has hx of a-fib, but has not converted into a regular rhythm in 6 weeks. Patient was cardioverted x3 6 weeks ago, and did not convert. Patient denies pain, but is complaining of fatigue and exertional dyspnea.

## 2013-04-23 NOTE — Progress Notes (Signed)
Echocardiogram 2D Echocardiogram has been performed.  [name] 04/23/2013, 4:07 PM

## 2013-04-24 ENCOUNTER — Ambulatory Visit: Payer: BLUE CROSS/BLUE SHIELD | Admitting: Podiatry

## 2013-04-24 LAB — COMPREHENSIVE METABOLIC PANEL
ALK PHOS: 68 U/L (ref 39–117)
ALT: 31 U/L (ref 0–35)
AST: 34 U/L (ref 0–37)
Albumin: 2.9 g/dL — ABNORMAL LOW (ref 3.5–5.2)
BILIRUBIN TOTAL: 0.4 mg/dL (ref 0.3–1.2)
BUN: 36 mg/dL — ABNORMAL HIGH (ref 6–23)
CHLORIDE: 104 meq/L (ref 96–112)
CO2: 29 meq/L (ref 19–32)
Calcium: 8.3 mg/dL — ABNORMAL LOW (ref 8.4–10.5)
Creatinine, Ser: 1.07 mg/dL (ref 0.50–1.10)
GFR, EST AFRICAN AMERICAN: 51 mL/min — AB (ref >90)
GFR, EST NON AFRICAN AMERICAN: 44 mL/min — AB (ref >90)
GLUCOSE: 100 mg/dL — AB (ref 70–99)
POTASSIUM: 4.3 meq/L (ref 3.7–5.3)
SODIUM: 145 meq/L (ref 137–147)
Total Protein: 5.8 g/dL — ABNORMAL LOW (ref 6.0–8.3)

## 2013-04-24 LAB — GLUCOSE, CAPILLARY
GLUCOSE-CAPILLARY: 136 mg/dL — AB (ref 70–99)
Glucose-Capillary: 90 mg/dL (ref 70–99)
Glucose-Capillary: 111 mg/dL — ABNORMAL HIGH (ref 70–99)
Glucose-Capillary: 93 mg/dL (ref 70–99)

## 2013-04-24 LAB — MAGNESIUM: Magnesium: 2.2 mg/dL (ref 1.5–2.5)

## 2013-04-24 LAB — PROTIME-INR
INR: 2.64 — AB (ref 0.00–1.49)
Prothrombin Time: 27.3 seconds — ABNORMAL HIGH (ref 11.6–15.2)

## 2013-04-24 MED ORDER — WARFARIN SODIUM 2 MG PO TABS
2.0000 mg | ORAL_TABLET | Freq: Once | ORAL | Status: AC
  Administered 2013-04-24: 2 mg via ORAL
  Filled 2013-04-24: qty 1

## 2013-04-24 MED ORDER — ALBUMIN HUMAN 25 % IV SOLN
25.0000 g | Freq: Once | INTRAVENOUS | Status: AC
Start: 2013-04-24 — End: 2013-04-24
  Administered 2013-04-24: 25 g via INTRAVENOUS
  Filled 2013-04-24: qty 100

## 2013-04-24 NOTE — Care Management Note (Addendum)
  Page 2 of 3   04/28/2013     1:53:34 PM   CARE MANAGEMENT NOTE 04/28/2013  Patient:  [name],[name]   Account Number:  [id number]  Date Initiated:  04/24/2013  Documentation initiated by:  Donato SchultzHUTCHINSON,Derrika Ruffalo  Subjective/Objective Assessment:   Admitted with CHF     Action/Plan:   CM to follow for disposition needs   Anticipated DC Date:  04/27/2013   Anticipated DC Plan:  HOME W HOME HEALTH SERVICES      DC Planning Services  CM consult       [facility]  [facility]   Choice offered to / List presented to:  C-4 Adult Children   DME arranged  [facility]      DME agency  [facility]     [facility]  HH-1 RN  HH-2 PT  HH-3 OT  HH-4 NURSE'S AIDE      HH agency  [facility]   Status of service:  Completed, signed off Medicare Important Message given?   (If response is "NO", the following Medicare IM given date fields will be blank) Date Medicare IM given:   Date Additional Medicare IM given:    Discharge Disposition:  HOME W HOME HEALTH SERVICES  Per UR Regulation:  Reviewed for med. necessity/level of care/duration of stay  If discussed at Long Length of Stay Meetings, dates discussed:    Comments:  04/28/2013 Social:  From home with son Crystal Ruiz- [name] [phone] PT/OT RECS:  Home health PT;Supervision/Assistance - 24 hour and HH OT if pt can have 24/7, if she can't she would benefit from SNF DME RECS:  None Disposition Plan:  Home with HHS:  PT, OT, RN CNA Disease MGMT:  CHF Medication MGMT Cardiologist:  Dr. Robynn PaneMohan [facility]enson [facility] PCP:  Dr. Silvio PateKaren Gold[facility] [phone] Patient and son interested in local PCP and plan to used Dr. Sharyn Henson until established  [facility]. (Registered Nurse, Physical Therapy, and Certified Nurse Aid services to start within 24-48 hours of discharge)   Contact information:   [address] [place] [address] [phone]     Follow up with Crystal PaneHARWANI,MOHAN N,  MD. Call in 1 week.   Specialty: Cardiology   Contact information:   [address] [place] [address] [phone]     Follow up with Inc[facility] - [facility]. (Home Oxygen ordered through [facility] - DME)   Contact information:   [address] [place] [address] [phone]  Donato Schultzrystal Crystal Grindstaff RN, BSN, [facility], [facility] 04/28/2013   04/25/2013 PT/OT RECS:  Home health PT;Supervision/Assistance - 24 hour and HH OT if pt can have 24/7, if she can't she would benefit from SNF DME RECS:  None Disposition Plan:  Home with HHS:  PT, OT - provider election pending Donato Schultzrystal Crystal Streck RN, BSN, MSHL, CCM 04/25/2013  04/24/2013 Admitted with CHF Social:  From home with son IOS:  IV Lasix Disposition:  Pending Crystal Elting RN, BSN, MSHL, CCM 04/24/2013

## 2013-04-24 NOTE — Evaluation (Addendum)
Occupational Therapy Evaluation Patient Details Name: [name] MRN: [id number] DOB: 23-Jul-1918 Today's Date: 04/24/2013    History of Present Illness Pt admitted with SOB and fatigue PMHx severe aortic stenosis, cardiac murmur, atrial fibrillation with RVR on chronic anticoagulation, pulmonary hypertension, hypothyroidism   Clinical Impression   Pt was admitted for the above.  She is normally mod I with all adls and lives alone.  At this time, she requires min A for transfers and mod to max A for LB adls.  She will benefit from skilled OT to increase safety and independence. Goals in acute are from supervision to min A.      Follow Up Recommendations    HHOT if pt can have 24/7, if she can't she would benefit from SNF   Equipment Recommendations    None by OT   Recommendations for Other Services       Precautions / Restrictions Precautions Precautions: Fall Restrictions Weight Bearing Restrictions: No      Mobility Bed Mobility Overal bed mobility: Needs Assistance Bed Mobility: Sit to Supine       Sit to supine: Min assist   General bed mobility comments: assist for LEs  Transfers Overall transfer level: Needs assistance Equipment used: Rolling walker (2 wheeled) Transfers: Sit to/from Stand Sit to Stand: Min assist         General transfer comment: assist to rise and steady    Balance Overall balance assessment: Needs assistance   Sitting balance-Leahy Scale: Good       Standing balance-Leahy Scale: Fair                              ADL Overall ADL's : Needs assistance/impaired     Grooming: Wash/dry hands;Set up;Sitting   Upper Body Bathing: Set up;Sitting   Lower Body Bathing: Moderate assistance;Sit to/from stand   Upper Body Dressing : Minimal assistance;Sitting   Lower Body Dressing: Maximal assistance;Sit to/from stand   Toilet Transfer: Minimal assistance;BSC;Stand-pivot             General ADL Comments: pt  has edema in bil LEs.  Changed mesh panties and pad after using commode.  Pt is fatiqued after visiting with family     Vision                     Perception     Praxis      Pertinent Vitals/Pain No c/o pain     Hand Dominance     Extremity/Trunk Assessment Upper Extremity Assessment Upper Extremity Assessment: Generalized weakness      Cervical / Trunk Assessment Cervical / Trunk Assessment: Kyphotic   Communication Communication Communication: No difficulties   Cognition Arousal/Alertness: Awake/alert Behavior During Therapy: WFL for tasks assessed/performed Overall Cognitive Status: Within Functional Limits for tasks assessed                     General Comments       Exercises       Shoulder Instructions      Home Living Family/patient expects to be discharged to:: Private residence Living Arrangements: Alone Available Help at Discharge: Family Type of Home: House Home Access: Level entry     Home Layout: One level     Bathroom Shower/Tub: Producer, television/film/video: Standard     Home Equipment: Shower seat - built in;Walker - 2 wheels;Cane - single point  Prior Functioning/Environment Level of Independence: Independent with assistive device(s)             OT Diagnosis: Generalized weakness   OT Problem List: Decreased strength;Decreased activity tolerance;Increased edema;Impaired balance (sitting and/or standing);Cardiopulmonary status limiting activity   OT Treatment/Interventions: Self-care/ADL training;DME and/or AE instruction;Patient/family education;Balance training    OT Goals(Current goals can be found in the care plan section) Acute Rehab OT Goals Patient Stated Goal: get back to being independent ADL Goals Pt Will Perform Grooming: with supervision;standing Pt Will Perform Lower Body Bathing: with min assist;sit to/from stand Pt Will Perform Lower Body Dressing: with min assist;sit to/from  stand Pt Will Transfer to Toilet: with supervision;ambulating;bedside commode Pt Will Perform Toileting - Clothing Manipulation and hygiene: with supervision;sit to/from stand  OT Frequency: Min 2X/week   Barriers to D/C:            Co-evaluation              End of Session    Activity Tolerance:  Limited by Fatique Patient left:  in bed with call bell next to her   Time: [id number] OT Time Calculation (min): 14 min Charges:  OT General Charges $OT Visit: 1 Procedure OT Evaluation $Initial OT Evaluation Tier I: 1 Procedure OT Treatments $Self Care/Home Management : 8-22 mins G-Codes:    [name] 04/24/2013, 4:32 PM  [name], OTR/L [phone] 04/24/2013

## 2013-04-24 NOTE — Progress Notes (Signed)
Subjective:  Patient denies any chest pain states breathing has slightly improved so is leg swelling.  Objective:  Vital Signs in the last 24 hours: Temp:  [97.3 F (36.3 C)-98.2 F (36.8 C)] 97.5 F (36.4 C) (04/02 0909) Pulse Rate:  [79-102] 79 (04/02 0909) Resp:  [16-20] 16 (04/02 0909) BP: (105-125)/(74-90) 120/81 mmHg (04/02 0909) SpO2:  [96 %-99 %] 97 % (04/02 0909) Weight:  [58.7 kg (129 lb 6.6 oz)-59.194 kg (130 lb 8 oz)] 58.7 kg (129 lb 6.6 oz) (04/02 0517)  Intake/Output from previous day: 04/01 0701 - 04/02 0700 In: 720 [P.O.:720] Out: 376 [Urine:375; Stool:1] Intake/Output from this shift: Total I/O In: 600 [P.O.:600] Out: 101 [Urine:100; Stool:1]  Physical Exam: Neck: JVD - 8 cm above sternal notch, no adenopathy, no carotid bruit and supple, symmetrical, trachea midline Lungs: Decreased breath sound at bases with bibasilar rales Heart: irregularly irregular rhythm, S1, S2 normal and 3/6 systolic murmur noted and soft diastolic murmur noted and S3 noted Abdomen: soft, non-tender; bowel sounds normal; no masses,  no organomegaly Extremities: No clubbing cyanosis 2+ edema noted  Lab Results:  Recent Labs  04/23/13 0645 04/23/13 0949  WBC 7.1 6.3  HGB 13.2 13.0  PLT 188 175    Recent Labs  04/23/13 0645 04/23/13 1625  NA 145 144  K 4.2 4.6  CL 103 103  CO2 27 29  GLUCOSE 95 90  BUN 39* 36*  CREATININE 1.14* 1.30*    Recent Labs  04/23/13 1625 04/23/13 2228  TROPONINI <0.30 <0.30   Hepatic Function Panel  Recent Labs  04/23/13 1625  PROT 5.9*  ALBUMIN 2.9*  AST 36  ALT 33  ALKPHOS 66  BILITOT 0.5    Recent Labs  04/23/13 0949  CHOL 125   No results found for this basename: PROTIME,  in the last 72 hours  Imaging: Imaging results have been reviewed and Dg Chest 2 View  04/23/2013   CLINICAL DATA:  Atrial fibrillation and difficulty breathing  EXAM: CHEST  2 VIEW  COMPARISON:  None.  FINDINGS: There are small pleural effusions  bilaterally with mild interstitial edema. There is no airspace consolidation. Heart is enlarged with mild pulmonary venous hypertension. No adenopathy. Pacemaker leads are attached to the right atrium and right ventricle. No pneumothorax. There is degenerative change in the thoracic spine. There is atherosclerotic change in the aorta.  IMPRESSION: Evidence of a degree of congestive heart failure. No appreciable airspace consolidation.   Electronically Signed   By: Bretta BangWilliam  Woodruff M.D.   On: 04/23/2013 07:20    Cardiac Studies:  Assessment/Plan:  Recurrent persistent A. fib with moderate ventricular response Decompensated congestive heart failure secondary to preserved systolic function  Severe calcific aortic stenosis  Hypertension  Tachybradycardia syndrome status post permanent pacemaker  Hypothyroidism  Hypercholesteremia  Degenerative joint disease  History of left hip fracture in the past  Hypoalbuminemia Plan As per orders  LOS: 1 day    [name] N 04/24/2013, 9:55 AM

## 2013-04-24 NOTE — Progress Notes (Signed)
Pt with +3 pitting edema, pt legs elevated on 3 pillows in bed. Pt educated on importance of taking lasix as well as elevating legs to decrease swelling. Will continue to monitor. Huel Coventryosenberger, Davien Malone A, RN

## 2013-04-24 NOTE — Evaluation (Signed)
Physical Therapy Evaluation Patient Details Name: [name] MRN: [id number] DOB: Oct 04, 1918 Today's Date: 04/24/2013   History of Present Illness  Pt admitted with SOB and fatigue PMHx severe aortic stenosis, cardiac murmur, atrial fibrillation with RVR on chronic anticoagulation, pulmonary hypertension, hypothyroidism  Clinical Impression  Pt pleasant and benefits from encouragement. Pt states she normally lives alone but son who is 5345yrs old can come live with her and be available 24hrs/day. Pt encouraged to continue mobility, HEP and OOB for meals daily with staff. Son present end of session and educated for recommendations as well. Pt did desaturate to 87% on RA with ambulation. Pt with below deficits and will benefit from acute therapy to maximize gait, strength and function to decrease burden of care. Pt reports no falls.     Follow Up Recommendations Home health PT;Supervision/Assistance - 24 hour    Equipment Recommendations  None recommended by PT    Recommendations for Other Services       Precautions / Restrictions Precautions Precautions: Fall      Mobility  Bed Mobility Overal bed mobility: Modified Independent             General bed mobility comments: increased time with rail and HOB 25degrees  Transfers Overall transfer level: Needs assistance   Transfers: Sit to/from Stand Sit to Stand: Supervision         General transfer comment: cues for hand placement and safety  Ambulation/Gait Ambulation/Gait assistance: Supervision Ambulation Distance (Feet): 15 Feet (x 2 trials with seated rest between) Assistive device: Rolling walker (2 wheeled) Gait Pattern/deviations: Step-through pattern;Decreased stride length;Trunk flexed   Gait velocity interpretation: <1.8 ft/sec, indicative of risk for recurrent falls    Stairs            Wheelchair Mobility    Modified Rankin (Stroke Patients Only)       Balance Overall balance  assessment: Needs assistance   Sitting balance-Leahy Scale: Good       Standing balance-Leahy Scale: Fair                               Pertinent Vitals/Pain 95% on RA at rest with drop to 87% after gait, returned 1L with sats 97% at rest HR 86-101 with activity No pain    Home Living Family/patient expects to be discharged to:: Private residence Living Arrangements: Alone Available Help at Discharge: Family Type of Home: House Home Access: Level entry     Home Layout: One level Home Equipment: Shower seat - built in;Walker - 2 wheels;Cane - single point      Prior Function Level of Independence: Independent with assistive device(s)               Hand Dominance        Extremity/Trunk Assessment   Upper Extremity Assessment: Generalized weakness           Lower Extremity Assessment: RLE deficits/detail;LLE deficits/detail RLE Deficits / Details: 4/5 :hip flexion, knee flexion/extension LLE Deficits / Details: 4/5 :hip flexion, knee flexion/extension  Cervical / Trunk Assessment: Kyphotic  Communication   Communication: No difficulties  Cognition Arousal/Alertness: Awake/alert Behavior During Therapy: WFL for tasks assessed/performed Overall Cognitive Status: Within Functional Limits for tasks assessed                      General Comments      Exercises General Exercises - Lower Extremity Ankle Circles/Pumps: AROM;Seated;Both;20  reps Long Arc Quad: AROM;Seated;Both;20 reps Hip Flexion/Marching: AROM;Seated;Both;20 reps      Assessment/Plan    PT Assessment Patient needs continued PT services  PT Diagnosis Difficulty walking   PT Problem List Decreased strength;Decreased activity tolerance;Decreased knowledge of use of DME;Decreased balance;Decreased mobility;Cardiopulmonary status limiting activity  PT Treatment Interventions Gait training;DME instruction;Functional mobility training;Therapeutic activities;Therapeutic  exercise;Patient/family education   PT Goals (Current goals can be found in the Care Plan section) Acute Rehab PT Goals Patient Stated Goal: return home and walking [place] the fountain (approximately 1 block from her house) PT Goal Formulation: With patient Time For Goal Achievement: 05/08/13 Potential to Achieve Goals: Good    Frequency Min 3X/week   Barriers to discharge        Co-evaluation               End of Session Equipment Utilized During Treatment: Gait belt Activity Tolerance: Patient tolerated treatment well Patient left: in chair;with call bell/phone within reach;with family/visitor present Nurse Communication: Mobility status         Time: 1610-9604 PT Time Calculation (min): 34 min   Charges:   PT Evaluation $Initial PT Evaluation Tier I: 1 Procedure PT Treatments $Therapeutic Exercise: 8-22 mins $Therapeutic Activity: 8-22 mins   PT G Codes:          Delorse Lek 04/24/2013, 2:38 PM Delaney Meigs, PT [phone]

## 2013-04-24 NOTE — Progress Notes (Addendum)
UR completed Coltan Spinello K. Garrin Kirwan, RN, BSN, MSHL, CCM  04/24/2013 10:45 AM

## 2013-04-24 NOTE — Progress Notes (Signed)
ANTICOAGULATION CONSULT NOTE - Follow Up Consult  Pharmacy Consult:  Coumadin Indication: atrial fibrillation  No Known Allergies  Patient Measurements: Height: 5\' 2"  (157.5 cm) (scale A) Weight: 129 lb 6.6 oz (58.7 kg) IBW/kg (Calculated) : 50.1  Vital Signs: Temp: 98.2 F (36.8 C) (04/02 0517) Temp src: Oral (04/02 0517) BP: 111/78 mmHg (04/02 0517) Pulse Rate: 86 (04/02 0517)  Labs:  Recent Labs  04/23/13 0645 04/23/13 0949 04/23/13 1625 04/23/13 2228  HGB 13.2 13.0  --   --   HCT 41.6 41.5  --   --   PLT 188 175  --   --   LABPROT 29.7*  --   --   --   INR 2.95*  --   --   --   CREATININE 1.14*  --  1.30*  --   TROPONINI <0.30 <0.30 <0.30 <0.30    Estimated Creatinine Clearance: 21.8 ml/min (by C-G formula based on Cr of 1.3).    Assessment: [age] YOF admitted with recurrent Afib with RVR and CHF to continue on Coumadin therapy.  Coumadin held last night by MD to allow INR to decrease closer to 2.  Currently at 2.64 and concern it will decrease significantly if dose is held again today.  No bleeding reported.   Goal of Therapy:  INR 2 - 3, keep close to 2 per Cards Monitor platelets by anticoagulation protocol: Yes    Plan:  - Coumadin 2mg  PO today - Daily PT / INR    Jaydin Boniface D. Laney Potashang, PharmD, BCPS Pager:  [phone] 04/24/2013, 12:08 PM

## 2013-04-25 LAB — GLUCOSE, CAPILLARY
GLUCOSE-CAPILLARY: 99 mg/dL (ref 70–99)
GLUCOSE-CAPILLARY: 88 mg/dL (ref 70–99)
GLUCOSE-CAPILLARY: 132 mg/dL — AB (ref 70–99)
Glucose-Capillary: 126 mg/dL — ABNORMAL HIGH (ref 70–99)

## 2013-04-25 LAB — COMPREHENSIVE METABOLIC PANEL
ALT: 27 U/L (ref 0–35)
AST: 29 U/L (ref 0–37)
Albumin: 2.9 g/dL — ABNORMAL LOW (ref 3.5–5.2)
Alkaline Phosphatase: 66 U/L (ref 39–117)
BUN: 34 mg/dL — ABNORMAL HIGH (ref 6–23)
CO2: 28 mEq/L (ref 19–32)
Calcium: 8.3 mg/dL — ABNORMAL LOW (ref 8.4–10.5)
Chloride: 104 mEq/L (ref 96–112)
Creatinine, Ser: 1.04 mg/dL (ref 0.50–1.10)
GFR calc non Af Amer: 45 mL/min — ABNORMAL LOW (ref >90)
GFR, EST AFRICAN AMERICAN: 52 mL/min — AB (ref >90)
Glucose, Bld: 90 mg/dL (ref 70–99)
Potassium: 4.5 mEq/L (ref 3.7–5.3)
SODIUM: 145 meq/L (ref 137–147)
TOTAL PROTEIN: 5.6 g/dL — AB (ref 6.0–8.3)
Total Bilirubin: 0.4 mg/dL (ref 0.3–1.2)

## 2013-04-25 LAB — PROTIME-INR
INR: 2.78 — AB (ref 0.00–1.49)
Prothrombin Time: 28.4 seconds — ABNORMAL HIGH (ref 11.6–15.2)

## 2013-04-25 MED ORDER — WARFARIN SODIUM 1 MG PO TABS
1.0000 mg | ORAL_TABLET | Freq: Once | ORAL | Status: AC
  Administered 2013-04-25: 1 mg via ORAL
  Filled 2013-04-25: qty 1

## 2013-04-25 MED ORDER — FUROSEMIDE 10 MG/ML IJ SOLN
40.0000 mg | Freq: Once | INTRAMUSCULAR | Status: AC
  Administered 2013-04-25: 40 mg via INTRAVENOUS
  Filled 2013-04-25: qty 4

## 2013-04-25 MED ORDER — DIGOXIN 0.0625 MG HALF TABLET
0.0625 mg | ORAL_TABLET | Freq: Every day | ORAL | Status: DC
  Administered 2013-04-25 – 2013-04-28 (×4): 0.0625 mg via ORAL
  Filled 2013-04-25 (×4): qty 1

## 2013-04-25 MED ORDER — LEVALBUTEROL HCL 0.63 MG/3ML IN NEBU: 0.6300 mg | INHALATION_SOLUTION | Freq: Four times a day (QID) | RESPIRATORY_TRACT | Status: DC | PRN

## 2013-04-25 MED ORDER — AMIODARONE HCL 200 MG PO TABS
200.0000 mg | ORAL_TABLET | Freq: Two times a day (BID) | ORAL | Status: DC
  Administered 2013-04-25 – 2013-04-28 (×6): 200 mg via ORAL
  Filled 2013-04-25 (×7): qty 1

## 2013-04-25 NOTE — Progress Notes (Signed)
ANTICOAGULATION CONSULT NOTE - Follow Up Consult  Pharmacy Consult:  Coumadin Indication: atrial fibrillation  No Known Allergies  Patient Measurements: Height: 5\' 2"  (157.5 cm) (scale A) Weight: 128 lb 12 oz (58.4 kg) IBW/kg (Calculated) : 50.1  Vital Signs: Temp: 98 F (36.7 C) (04/03 0501) Temp src: Oral (04/03 0501) BP: 147/80 mmHg (04/03 0501) Pulse Rate: 82 (04/03 0501)  Labs:  Recent Labs  04/23/13 0645 04/23/13 0949 04/23/13 1625 04/23/13 2228 04/24/13 0840 04/25/13 0430  HGB 13.2 13.0  --   --   --   --   HCT 41.6 41.5  --   --   --   --   PLT 188 175  --   --   --   --   LABPROT 29.7*  --   --   --  27.3* 28.4*  INR 2.95*  --   --   --  2.64* 2.78*  CREATININE 1.14*  --  1.30*  --  1.07 1.04  TROPONINI <0.30 <0.30 <0.30 <0.30  --   --     Estimated Creatinine Clearance: 27.3 ml/min (by C-G formula based on Cr of 1.04).    Assessment: [age] YOF admitted with recurrent Afib with RVR and CHF to continue on Coumadin therapy.  INR therapeutic but will aim for the lower end of goal.  No bleeding reported.   Goal of Therapy:  INR 2 - 3, keep close to 2 per Cards Monitor platelets by anticoagulation protocol: Yes    Plan:  - Coumadin 1mg  PO today - Daily PT / INR    Shelli Portilla D. Laney Potashang, PharmD, BCPS Pager:  [phone] 04/25/2013, 8:23 AM

## 2013-04-25 NOTE — Progress Notes (Signed)
04/25/13 0700-1900 Pt.A/Ox3, [facility] She had no c/o pain. In the am patient had a episode of dyspnea at rest. VS were taken and lung sounds had expiratory wheezing. Attending MD was paged and notified. New orders written. Pt.had no further c/o SOB during the shift.

## 2013-04-25 NOTE — Progress Notes (Signed)
i cosign with Demarcus Steward on all assessments, medication administration and documentation for this shift.  

## 2013-04-25 NOTE — Progress Notes (Signed)
Subjective:  Patient denies any chest pains. States breathing has improved. And leg swelling gradually improving  Objective:  Vital Signs in the last 24 hours: Temp:  [97.1 F (36.2 C)-98.3 F (36.8 C)] 98.3 F (36.8 C) (04/03 0835) Pulse Rate:  [81-90] 81 (04/03 0835) Resp:  [18] 18 (04/03 0835) BP: (121-147)/(64-92) 133/86 mmHg (04/03 0835) SpO2:  [95 %-100 %] 98 % (04/03 0835) Weight:  [58.4 kg (128 lb 12 oz)] 58.4 kg (128 lb 12 oz) (04/03 0501)  Intake/Output from previous day: 04/02 0701 - 04/03 0700 In: 1820 [P.O.:1320; I.V.:400; IV Piggyback:100] Out: 652 [Urine:650; Stool:2] Intake/Output from this shift: Total I/O In: 240 [P.O.:240] Out: -   Physical Exam: Neck: JVD - 6 cm above sternal notch, no adenopathy, no carotid bruit and supple, symmetrical, trachea midline Lungs: Decreased breath sound at bases with fine rales Heart: irregularly irregular rhythm, S1, S2 normal and 3/6 systolic murmur and soft diastolic murmur and soft S3 noted Abdomen: soft, non-tender; bowel sounds normal; no masses,  no organomegaly Extremities: No clubbing cyanosis 2+ edema noted  Lab Results:  Recent Labs  04/23/13 0645 04/23/13 0949  WBC 7.1 6.3  HGB 13.2 13.0  PLT 188 175    Recent Labs  04/24/13 0840 04/25/13 0430  NA 145 145  K 4.3 4.5  CL 104 104  CO2 29 28  GLUCOSE 100* 90  BUN 36* 34*  CREATININE 1.07 1.04    Recent Labs  04/23/13 1625 04/23/13 2228  TROPONINI <0.30 <0.30   Hepatic Function Panel  Recent Labs  04/25/13 0430  PROT 5.6*  ALBUMIN 2.9*  AST 29  ALT 27  ALKPHOS 66  BILITOT 0.4    Recent Labs  04/23/13 0949  CHOL 125   No results found for this basename: PROTIME,  in the last 72 hours  Imaging: Imaging results have been reviewed and No results found.  Cardiac Studies:  Assessment/Plan:  Recurrent persistent A. fib with moderate ventricular response  Decompensated congestive heart failure secondary to preserved systolic  function  Severe calcific aortic stenosis  Hypertension  Tachybradycardia syndrome status post permanent pacemaker  Hypothyroidism  Hypercholesteremia  Degenerative joint disease  History of left hip fracture in the past  Hypoalbuminemia  Plan Reduce amiodarone dose to 200 mg twice daily Change digoxin to by mouth as per orders Agree with restarting with Coumadin Check labs and chest x-ray in a.m.  LOS: 2 days    [name] N 04/25/2013, 10:43 AM

## 2013-04-25 NOTE — ED Provider Notes (Signed)
Medical screening examination/treatment/procedure(s) were conducted as a shared visit with non-physician practitioner(s) or resident and myself. I personally evaluated the patient during the encounter and agree with the findings and plan unless otherwise indicated.  I have personally reviewed any xrays and/ or EKG's with the provider and I agree with interpretation.  Patient with high blood pressure, pacemaker, atrial fibrillation presents with worsening shortness of breath for the past week. [facility] and had medical management and cardioversion attempt x3 with no resolution of atrial fibrillation. Patient did feel improved upon discharge however has gradually worsened since. Shortness of breath is exertional. Patient feels she has gained weight however family members feel she is at her base weight since discharge. Exam patient has crackles at the bases bilaterally her lungs. No distress. 3+ pitting edema in the lower extremities bilateral. With chronic skin changes. Mild excoriations to the lower extremities bilateral with no acute cellulitis at this time. Plan for cardiac workup with differential including an nSTEMI, atrial fibrillation, CHF, worsening aortic stenosis or other cause of her worsening dyspnea. Plan for telemetry admission.  Acute Heart Failure, Atrial Fibrillation   Enid SkeensJoshua M Isabella Ida, MD 04/25/13 2037

## 2013-04-26 ENCOUNTER — Inpatient Hospital Stay (HOSPITAL_COMMUNITY): Payer: Medicare Other

## 2013-04-26 LAB — PROTIME-INR
INR: 2.4 — ABNORMAL HIGH (ref 0.00–1.49)
PROTHROMBIN TIME: 25.4 s — AB (ref 11.6–15.2)

## 2013-04-26 LAB — COMPREHENSIVE METABOLIC PANEL
ALBUMIN: 2.9 g/dL — AB (ref 3.5–5.2)
ALT: 28 U/L (ref 0–35)
AST: 32 U/L (ref 0–37)
Alkaline Phosphatase: 67 U/L (ref 39–117)
BILIRUBIN TOTAL: 0.5 mg/dL (ref 0.3–1.2)
BUN: 28 mg/dL — AB (ref 6–23)
CHLORIDE: 101 meq/L (ref 96–112)
CO2: 29 mEq/L (ref 19–32)
CREATININE: 0.88 mg/dL (ref 0.50–1.10)
Calcium: 8.5 mg/dL (ref 8.4–10.5)
GFR calc Af Amer: 64 mL/min — ABNORMAL LOW (ref >90)
GFR calc non Af Amer: 55 mL/min — ABNORMAL LOW (ref >90)
Glucose, Bld: 104 mg/dL — ABNORMAL HIGH (ref 70–99)
Potassium: 4.2 mEq/L (ref 3.7–5.3)
Sodium: 144 mEq/L (ref 137–147)
Total Protein: 5.9 g/dL — ABNORMAL LOW (ref 6.0–8.3)

## 2013-04-26 LAB — GLUCOSE, CAPILLARY
Glucose-Capillary: 107 mg/dL — ABNORMAL HIGH (ref 70–99)
Glucose-Capillary: 89 mg/dL (ref 70–99)
Glucose-Capillary: 101 mg/dL — ABNORMAL HIGH (ref 70–99)
Glucose-Capillary: 107 mg/dL — ABNORMAL HIGH (ref 70–99)

## 2013-04-26 LAB — PRO B NATRIURETIC PEPTIDE: PRO B NATRI PEPTIDE: 4369 pg/mL — AB (ref 0–450)

## 2013-04-26 MED ORDER — WARFARIN SODIUM 1 MG PO TABS
1.5000 mg | ORAL_TABLET | Freq: Once | ORAL | Status: AC
Start: 2013-04-26 — End: 2013-04-26
  Administered 2013-04-26: 1.5 mg via ORAL
  Filled 2013-04-26: qty 1

## 2013-04-26 MED ORDER — FUROSEMIDE 10 MG/ML IJ SOLN
INTRAMUSCULAR | Status: AC
  Filled 2013-04-26: qty 4

## 2013-04-26 MED ORDER — FUROSEMIDE 10 MG/ML IJ SOLN
40.0000 mg | Freq: Once | INTRAMUSCULAR | Status: AC
  Administered 2013-04-26: 40 mg via INTRAVENOUS

## 2013-04-26 NOTE — Progress Notes (Signed)
ANTICOAGULATION CONSULT NOTE - Follow Up Consult  Pharmacy Consult:  Coumadin Indication: atrial fibrillation  No Known Allergies  Patient Measurements: Height: 5\' 2"  (157.5 cm) (scale A) Weight: 127 lb 14.4 oz (58.015 kg) (scale A) IBW/kg (Calculated) : 50.1  Vital Signs: Temp: 97.8 F (36.6 C) (04/04 0503) Temp src: Oral (04/04 0503) BP: 136/81 mmHg (04/04 0503) Pulse Rate: 79 (04/04 0503)  Labs:  Recent Labs  04/23/13 0949  04/23/13 1625 04/23/13 2228 04/24/13 0840 04/25/13 0430 04/26/13 0348  HGB 13.0  --   --   --   --   --   --   HCT 41.5  --   --   --   --   --   --   PLT 175  --   --   --   --   --   --   LABPROT  --   --   --   --  27.3* 28.4* 25.4*  INR  --   --   --   --  2.64* 2.78* 2.40*  CREATININE  --   < > 1.30*  --  1.07 1.04 0.88  TROPONINI <0.30  --  <0.30 <0.30  --   --   --   < > = values in this interval not displayed.  Estimated Creatinine Clearance: 32.3 ml/min (by C-G formula based on Cr of 0.88).    Assessment: [age] YOF admitted with recurrent Afib with RVR and CHF to continue on Coumadin therapy. Per cards, aim for lower end of goal INR range, closer to 2. INR remains therapeutic at 2.4 today (down from 2.78 yesterday after a decreased dose last night). No bleeding issues reported.  Will provide a slightly increased dose from previous dose to prevent the INR from decreasing too significantly and potentially becoming subtherapeutic. Will still remain conservative overall in dosing to target the lower end of the therapeutic range.  Of note, patient is also on amiodarone which can potentially elevate the INR. Will continue to monitor.  Goal of Therapy:  INR 2 - 3, keep close to 2 per Cards Monitor platelets by anticoagulation protocol: Yes    Plan:  - Coumadin 1.5 mg PO today - Daily PT / INR  Miquel Stacks C. Finnick Orosz, PharmD Clinical Pharmacist-Resident Pager: [phone] Pharmacy: [phone] 04/26/2013 8:09 AM

## 2013-04-26 NOTE — Plan of Care (Signed)
Problem: Phase I Progression Outcomes Goal: EF % per last Echo/documented,Core Reminder form on chart Outcome: Completed/Met Date Met:  04/26/13 EF 50-55%(04-23-13)

## 2013-04-26 NOTE — Progress Notes (Signed)
Subjective:  Denies any chest pains but complains of shortness of breath with minimal exertion next swelling slowly improving  Objective:  Vital Signs in the last 24 hours: Temp:  [97.5 F (36.4 C)-97.8 F (36.6 C)] 97.8 F (36.6 C) (04/04 0503) Pulse Rate:  [75-80] 79 (04/04 0503) Resp:  [17-18] 18 (04/04 0503) BP: (134-145)/(81-88) 136/81 mmHg (04/04 0503) SpO2:  [93 %-100 %] 93 % (04/04 0503) Weight:  [58.015 kg (127 lb 14.4 oz)] 58.015 kg (127 lb 14.4 oz) (04/04 0503)  Intake/Output from previous day: 04/03 0701 - 04/04 0700 In: 1300 [P.O.:1300] Out: 950 [Urine:950] Intake/Output from this shift:    Physical Exam: Neck: JVD - 6 cm above sternal notch, no adenopathy, no carotid bruit, supple, symmetrical, trachea midline and thyroid not enlarged, symmetric, no tenderness/mass/nodules Lungs: Decrease breath sounds at bases with fine rales Heart: irregularly irregular rhythm and S1, S2 normal Abdomen: soft, non-tender; bowel sounds normal; no masses,  no organomegaly Extremities: No clubbing cyanosis 2+ edema noted  Lab Results:  Recent Labs  04/23/13 0949  WBC 6.3  HGB 13.0  PLT 175    Recent Labs  04/25/13 0430 04/26/13 0348  NA 145 144  K 4.5 4.2  CL 104 101  CO2 28 29  GLUCOSE 90 104*  BUN 34* 28*  CREATININE 1.04 0.88    Recent Labs  04/23/13 1625 04/23/13 2228  TROPONINI <0.30 <0.30   Hepatic Function Panel  Recent Labs  04/26/13 0348  PROT 5.9*  ALBUMIN 2.9*  AST 32  ALT 28  ALKPHOS 67  BILITOT 0.5    Recent Labs  04/23/13 0949  CHOL 125   No results found for this basename: PROTIME,  in the last 72 hours  Imaging: Imaging results have been reviewed and Dg Chest 2 View  04/26/2013   CLINICAL DATA:  Congestive failure  EXAM: CHEST  2 VIEW  COMPARISON:  04/23/2013  FINDINGS: Cardiac shadow is stable. A pacing device is again seen. Bibasilar effusions left greater than right are noted. No significant vascular congestion is noted.  Mild interstitial changes are again seen.  IMPRESSION: No significant interval change from the prior exam. Bibasilar changes remain with mild interstitial change.   Electronically Signed   By: Alcide CleverMark  Lukens M.D.   On: 04/26/2013 08:03    Cardiac Studies:  Assessment/Plan:  Chronic persistent A. fib with now controlled ventricular response Resolving congestive heart failure secondary to preserved systolic function  Severe calcific aortic stenosis  Hypertension  Tachybradycardia syndrome status post permanent pacemaker  Hypothyroidism  Hypercholesteremia  Degenerative joint disease  History of left hip fracture in the past  Hypoalbuminemia  Plan Continue present management Lasix IV one dose extra today   LOS: 3 days    [name] N 04/26/2013, 8:53 AM

## 2013-04-27 LAB — COMPREHENSIVE METABOLIC PANEL
ALBUMIN: 2.8 g/dL — AB (ref 3.5–5.2)
ALT: 26 U/L (ref 0–35)
AST: 27 U/L (ref 0–37)
Alkaline Phosphatase: 68 U/L (ref 39–117)
BUN: 27 mg/dL — ABNORMAL HIGH (ref 6–23)
CALCIUM: 8.3 mg/dL — AB (ref 8.4–10.5)
CO2: 32 mEq/L (ref 19–32)
Chloride: 101 mEq/L (ref 96–112)
Creatinine, Ser: 0.9 mg/dL (ref 0.50–1.10)
GFR calc non Af Amer: 54 mL/min — ABNORMAL LOW (ref >90)
GFR, EST AFRICAN AMERICAN: 62 mL/min — AB (ref >90)
Glucose, Bld: 85 mg/dL (ref 70–99)
Potassium: 4.3 mEq/L (ref 3.7–5.3)
Sodium: 144 mEq/L (ref 137–147)
TOTAL PROTEIN: 5.6 g/dL — AB (ref 6.0–8.3)
Total Bilirubin: 0.5 mg/dL (ref 0.3–1.2)

## 2013-04-27 LAB — PROTIME-INR
INR: 2.05 — AB (ref 0.00–1.49)
PROTHROMBIN TIME: 22.5 s — AB (ref 11.6–15.2)

## 2013-04-27 LAB — GLUCOSE, CAPILLARY
GLUCOSE-CAPILLARY: 87 mg/dL (ref 70–99)
GLUCOSE-CAPILLARY: 104 mg/dL — AB (ref 70–99)
Glucose-Capillary: 94 mg/dL (ref 70–99)
Glucose-Capillary: 89 mg/dL (ref 70–99)

## 2013-04-27 MED ORDER — WARFARIN SODIUM 2 MG PO TABS
2.0000 mg | ORAL_TABLET | Freq: Once | ORAL | Status: AC
  Administered 2013-04-27: 2 mg via ORAL
  Filled 2013-04-27: qty 1

## 2013-04-27 MED ORDER — FUROSEMIDE 10 MG/ML IJ SOLN
40.0000 mg | Freq: Once | INTRAMUSCULAR | Status: AC
  Administered 2013-04-27: 40 mg via INTRAVENOUS
  Filled 2013-04-27: qty 4

## 2013-04-27 MED ORDER — ALBUMIN HUMAN 25 % IV SOLN
25.0000 g | Freq: Once | INTRAVENOUS | Status: AC
  Administered 2013-04-27: 25 g via INTRAVENOUS
  Filled 2013-04-27: qty 100

## 2013-04-27 NOTE — Discharge Instructions (Signed)
Information on my medicine - Coumadin®   (Warfarin) ° °This medication education was reviewed with me or my healthcare representative as part of my discharge preparation.  The pharmacist that spoke with me during my hospital stay was:  Suzzanne Brunkhorst B, RPH ° °Why was Coumadin prescribed for you? °Coumadin was prescribed for you because you have a blood clot or a medical condition that can cause an increased risk of forming blood clots. Blood clots can cause serious health problems by blocking the flow of blood to the heart, lung, or brain. Coumadin can prevent harmful blood clots from forming. °As a reminder your indication for Coumadin is:   Stroke Prevention Because Of Atrial Fibrillation ° °What test will check on my response to Coumadin? °While on Coumadin (warfarin) you will need to have an INR test regularly to ensure that your dose is keeping you in the desired range. The INR (international normalized ratio) number is calculated from the result of the laboratory test called prothrombin time (PT). ° °If an INR APPOINTMENT HAS NOT ALREADY BEEN MADE FOR YOU please schedule an appointment to have this lab work done by your health care provider within 7 days. °Your INR goal is usually a number between:  2 to 3 or your provider may give you a more narrow range like 2-2.5.  Ask your health care provider during an office visit what your goal INR is. ° °What  do you need to  know  About  COUMADIN? °Take Coumadin (warfarin) exactly as prescribed by your healthcare provider about the same time each day.  DO NOT stop taking without talking to the doctor who prescribed the medication.  Stopping without other blood clot prevention medication to take the place of Coumadin may increase your risk of developing a new clot or stroke.  Get refills before you run out. ° °What do you do if you miss a dose? °If you miss a dose, take it as soon as you remember on the same day then continue your regularly scheduled regimen the next  day.  Do not take two doses of Coumadin at the same time. ° °Important Safety Information °A possible side effect of Coumadin (Warfarin) is an increased risk of bleeding. You should call your healthcare provider right away if you experience any of the following: °  Bleeding from an injury or your nose that does not stop. °  Unusual colored urine (red or dark brown) or unusual colored stools (red or black). °  Unusual bruising for unknown reasons. °  A serious fall or if you hit your head (even if there is no bleeding). ° °Some foods or medicines interact with Coumadin® (warfarin) and might alter your response to warfarin. To help avoid this: °  Eat a balanced diet, maintaining a consistent amount of Vitamin K. °  Notify your provider about major diet changes you plan to make. °  Avoid alcohol or limit your intake to 1 drink for women and 2 drinks for men per day. °(1 drink is 5 oz. wine, 12 oz. beer, or 1.5 oz. liquor.) ° °Make sure that ANY health care provider who prescribes medication for you knows that you are taking Coumadin (warfarin).  Also make sure the healthcare provider who is monitoring your Coumadin knows when you have started a new medication including herbals and non-prescription products. ° °Coumadin® (Warfarin)  Major Drug Interactions  °Increased Warfarin Effect Decreased Warfarin Effect  °Alcohol (large quantities) °Antibiotics (esp. Septra/Bactrim, Flagyl, Cipro) °Amiodarone (Cordarone) °Aspirin (  ASA) °Cimetidine (Tagamet) °Megestrol (Megace) °NSAIDs (ibuprofen, naproxen, etc.) °Piroxicam (Feldene) °Propafenone (Rythmol SR) °Propranolol (Inderal) °Isoniazid (INH) °Posaconazole (Noxafil) Barbiturates (Phenobarbital) °Carbamazepine (Tegretol) °Chlordiazepoxide (Librium) °Cholestyramine (Questran) °Griseofulvin °Oral Contraceptives °Rifampin °Sucralfate (Carafate) °Vitamin K  ° °Coumadin® (Warfarin) Major Herbal Interactions  °Increased Warfarin Effect Decreased Warfarin Effect   °Garlic °Ginseng °Ginkgo biloba Coenzyme Q10 °Green tea °St. [name]’s wort   ° °Coumadin® (Warfarin) FOOD Interactions  °Eat a consistent number of servings per week of foods HIGH in Vitamin K °(1 serving = ½ cup)  °Collards (cooked, or boiled & drained) °Kale (cooked, or boiled & drained) °Mustard greens (cooked, or boiled & drained) °Parsley *serving size only = ¼ cup °Spinach (cooked, or boiled & drained) °Swiss chard (cooked, or boiled & drained) °Turnip greens (cooked, or boiled & drained)  °Eat a consistent number of servings per week of foods MEDIUM-HIGH in Vitamin K °(1 serving = 1 cup)  °Asparagus (cooked, or boiled & drained) °Broccoli (cooked, boiled & drained, or raw & chopped) °Brussel sprouts (cooked, or boiled & drained) *serving size only = ½ cup °Lettuce, raw (green leaf, endive, romaine) °Spinach, raw °Turnip greens, raw & chopped  ° °These websites have more information on Coumadin (warfarin):  www.coumadin.com; °www.ahrq.gov/consumer/coumadin.htm; ° ° ° °

## 2013-04-27 NOTE — Progress Notes (Signed)
Subjective:  Complaints of feeling tired feeling weak and short of breath. Denies any chest pain. Paced rhythm on the monitor heart rate well-controlled Objective:  Vital Signs in the last 24 hours: Temp:  [97.3 F (36.3 C)-98.1 F (36.7 C)] 98 F (36.7 C) (04/05 0620) Pulse Rate:  [79-83] 80 (04/05 0620) Resp:  [14-20] 14 (04/05 0620) BP: (135-145)/(77-89) 135/87 mmHg (04/05 0620) SpO2:  [97 %-100 %] 99 % (04/05 0620) Weight:  [56.881 kg (125 lb 6.4 oz)] 56.881 kg (125 lb 6.4 oz) (04/05 0620)  Intake/Output from previous day: 04/04 0701 - 04/05 0700 In: 600 [P.O.:600] Out: 400 [Urine:400] Intake/Output from this shift:    Physical Exam: Neck: no adenopathy, no carotid bruit, supple, symmetrical, trachea midline and Positive JVD Lungs: Decreased breath sounds at bases with basilar rales Heart: regularly irregular rhythm, S1, S2 normal and Soft systolic murmur and S3 gallop noted Abdomen: soft, non-tender; bowel sounds normal; no masses,  no organomegaly Extremities: No clubbing cyanosis 2+ edema noted  Lab Results: No results found for this basename: WBC, HGB, PLT,  in the last 72 hours  Recent Labs  04/26/13 0348 04/27/13 0521  NA 144 144  K 4.2 4.3  CL 101 101  CO2 29 32  GLUCOSE 104* 85  BUN 28* 27*  CREATININE 0.88 0.90   No results found for this basename: TROPONINI, CK, MB,  in the last 72 hours Hepatic Function Panel  Recent Labs  04/27/13 0521  PROT 5.6*  ALBUMIN 2.8*  AST 27  ALT 26  ALKPHOS 68  BILITOT 0.5   No results found for this basename: CHOL,  in the last 72 hours No results found for this basename: PROTIME,  in the last 72 hours  Imaging: Imaging results have been reviewed and Dg Chest 2 View  04/26/2013   CLINICAL DATA:  Congestive failure  EXAM: CHEST  2 VIEW  COMPARISON:  04/23/2013  FINDINGS: Cardiac shadow is stable. A pacing device is again seen. Bibasilar effusions left greater than right are noted. No significant vascular  congestion is noted. Mild interstitial changes are again seen.  IMPRESSION: No significant interval change from the prior exam. Bibasilar changes remain with mild interstitial change.   Electronically Signed   By: Alcide CleverMark  Lukens M.D.   On: 04/26/2013 08:03    Cardiac Studies:  Assessment/Plan:  Chronic persistent A. fib with now controlled ventricular response  Resolving congestive heart failure secondary to preserved systolic function  Severe calcific aortic stenosis  Hypertension  Tachybradycardia syndrome status post permanent pacemaker  Hypothyroidism  Hypercholesteremia  Degenerative joint disease  History of left hip fracture in the past  Hypoalbuminemia Plan As per orders  LOS: 4 days    Thierno Hun N 04/27/2013, 10:14 AM

## 2013-04-27 NOTE — Progress Notes (Signed)
ANTICOAGULATION CONSULT NOTE - Follow Up Consult  Pharmacy Consult:  Coumadin Indication: atrial fibrillation  No Known Allergies  Patient Measurements: Height: 5\' 2"  (157.5 cm) (scale A) Weight: 125 lb 6.4 oz (56.881 kg) IBW/kg (Calculated) : 50.1  Vital Signs: Temp: 98 F (36.7 C) (04/05 0620) Temp src: Oral (04/05 0620) BP: 130/66 mmHg (04/05 1040) Pulse Rate: 80 (04/05 1040)  Labs:  Recent Labs  04/25/13 0430 04/26/13 0348 04/27/13 0521  LABPROT 28.4* 25.4* 22.5*  INR 2.78* 2.40* 2.05*  CREATININE 1.04 0.88 0.90    Estimated Creatinine Clearance: 31.5 ml/min (by C-G formula based on Cr of 0.9).  Assessment: [age] YOF admitted with recurrent Afib with RVR and CHF to continue on Coumadin therapy. Per cards, aim for lower end of goal INR range, closer to 2. INR remains therapeutic at 2.05 today, though decreased significantly from 2.4 yesterday despite receiving a slightly higher dose. No bleeding issues reported.  Will provide a slightly increased dose from previous dose due to the recent decrease despite receiving an increased dose. This is likely the cumulative effect of holding a dose and receiving lower doses since that held dose. Will still remain conservative overall in dosing to target the lower end of the therapeutic range.  Of note, patient is also on amiodarone which can potentially elevate the INR. Will continue to monitor.  Goal of Therapy:  INR 2 - 3, keep close to 2 per Cards Monitor platelets by anticoagulation protocol: Yes    Plan:  - Coumadin 2 mg PO today - Daily PT / INR  Shaquel Josephson C. Lynsey Ange, PharmD Clinical Pharmacist-Resident Pager: [phone] Pharmacy: [phone] 04/27/2013 11:00 AM

## 2013-04-28 LAB — PROTIME-INR
INR: 2.05 — ABNORMAL HIGH (ref 0.00–1.49)
PROTHROMBIN TIME: 22.5 s — AB (ref 11.6–15.2)

## 2013-04-28 LAB — COMPREHENSIVE METABOLIC PANEL
ALBUMIN: 2.9 g/dL — AB (ref 3.5–5.2)
ALT: 28 U/L (ref 0–35)
AST: 32 U/L (ref 0–37)
Alkaline Phosphatase: 63 U/L (ref 39–117)
BILIRUBIN TOTAL: 0.5 mg/dL (ref 0.3–1.2)
BUN: 29 mg/dL — AB (ref 6–23)
CHLORIDE: 101 meq/L (ref 96–112)
CO2: 30 mEq/L (ref 19–32)
Calcium: 8.2 mg/dL — ABNORMAL LOW (ref 8.4–10.5)
Creatinine, Ser: 0.93 mg/dL (ref 0.50–1.10)
GFR calc Af Amer: 60 mL/min — ABNORMAL LOW (ref >90)
GFR calc non Af Amer: 52 mL/min — ABNORMAL LOW (ref >90)
Glucose, Bld: 112 mg/dL — ABNORMAL HIGH (ref 70–99)
POTASSIUM: 4.3 meq/L (ref 3.7–5.3)
Sodium: 144 mEq/L (ref 137–147)
Total Protein: 5.6 g/dL — ABNORMAL LOW (ref 6.0–8.3)

## 2013-04-28 LAB — GLUCOSE, CAPILLARY
GLUCOSE-CAPILLARY: 142 mg/dL — AB (ref 70–99)
Glucose-Capillary: 158 mg/dL — ABNORMAL HIGH (ref 70–99)
Glucose-Capillary: 257 mg/dL — ABNORMAL HIGH (ref 70–99)

## 2013-04-28 LAB — PRO B NATRIURETIC PEPTIDE: Pro B Natriuretic peptide (BNP): 4959 pg/mL — ABNORMAL HIGH (ref 0–450)

## 2013-04-28 MED ORDER — DIGOXIN 62.5 MCG PO TABS: 0.0625 mg | ORAL_TABLET | Freq: Every day | ORAL | Status: DC

## 2013-04-28 MED ORDER — POTASSIUM CHLORIDE CRYS ER 20 MEQ PO TBCR: 10.0000 meq | EXTENDED_RELEASE_TABLET | Freq: Every day | ORAL | Status: DC

## 2013-04-28 MED ORDER — WARFARIN SODIUM 2 MG PO TABS: 2.0000 mg | ORAL_TABLET | Freq: Every day | ORAL | Status: DC

## 2013-04-28 MED ORDER — AMIODARONE HCL 200 MG PO TABS: 200.0000 mg | ORAL_TABLET | Freq: Two times a day (BID) | ORAL | Status: DC

## 2013-04-28 MED ORDER — WARFARIN SODIUM 2 MG PO TABS
2.0000 mg | ORAL_TABLET | Freq: Once | ORAL | Status: DC
  Filled 2013-04-28: qty 1

## 2013-04-28 MED ORDER — METOPROLOL SUCCINATE ER 25 MG PO TB24: 25.0000 mg | ORAL_TABLET | Freq: Every day | ORAL | Status: DC

## 2013-04-28 NOTE — Progress Notes (Addendum)
SATURATION QUALIFICATIONS: (This note is used to comply with regulatory documentation for home oxygen)  Patient Saturations on Room Air at Rest =94%  Patient Saturations on Room Air while Ambulating 84%  Patient Saturations on 2 Liters of oxygen while Ambulating 92% Please briefly explain why patient needs home oxygen:

## 2013-04-28 NOTE — Progress Notes (Signed)
Occupational Therapy Treatment Patient Details Name: [name] MRN: [id number] DOB: 1918-06-10 Today's Date: 04/28/2013    History of present illness Pt admitted with SOB and fatigue PMHx severe aortic stenosis, cardiac murmur, atrial fibrillation with RVR on chronic anticoagulation, pulmonary hypertension, hypothyroidism   OT comments  Pt. Was able to to perform mobility at [facility] A level. Pt. Requires Min A with LE dressing secondary to LE swelling. Pt. Has decreased strength and endurance and will need 24 hour A/S at d/c.   Follow Up Recommendations  Home health OT;Supervision/Assistance - 24 hour    Equipment Recommendations  None recommended by OT    Recommendations for Other Services      Precautions / Restrictions Precautions Precautions: Fall Restrictions Weight Bearing Restrictions: No       Mobility Bed Mobility               General bed mobility comments: Pt. is Min A to bring L leg up into bed.   Transfers Overall transfer level: Needs assistance               General transfer comment: Pt. is Min guard A with sit to stand form bed and to side step up to head of bed.     Balance                                   ADL                       Lower Body Dressing: Minimal assistance   Toilet Transfer: Min guard             General ADL Comments: Pt. Min A to don disposable underwear and socks. Pt. ith edema in LE that makes it more difficult to perform LE ADLs.      Vision                     Perception     Praxis      Cognition   Behavior During Therapy: WFL for tasks assessed/performed Overall Cognitive Status: Within Functional Limits for tasks assessed                       Extremity/Trunk Assessment               Exercises     Shoulder Instructions       General Comments      Pertinent Vitals/ Pain       No c/o pain  Home Living                                           Prior Functioning/Environment              Frequency Min 2X/week     Progress Toward Goals  OT Goals(current goals can now be found in the care plan section)  Progress towards OT goals: Progressing toward goals     Plan      Co-evaluation                 End of Session Equipment Utilized During Treatment: Rolling walker   Activity Tolerance Patient tolerated treatment well   Patient Left in bed;with call bell/phone within reach   Nurse  Communication          Time: [id number] OT Time Calculation (min): 43 min  Charges: OT General Charges $OT Visit: 1 Procedure OT Treatments $Self Care/Home Management : 23-37 mins $Therapeutic Activity: 8-22 mins  [name] 04/28/2013, 9:51 AM

## 2013-04-28 NOTE — Progress Notes (Signed)
Physical Therapy Treatment Patient Details Name: [name] MRN: [id number] DOB: 1918/02/28 Today's Date: 04/28/2013    History of Present Illness Pt admitted with SOB and fatigue PMHx severe aortic stenosis, cardiac murmur, atrial fibrillation with RVR on chronic anticoagulation, pulmonary hypertension, hypothyroidism    PT Comments    Pt progressing towards physical therapy goals. Somewhat preoccupied with discharge and wanting to go home as soon as possible. Pt on RA when PT entered, and saturations at 94% at rest. After gait training, saturations dropped to 78% on RA, and supplemental O2 was donned at 1L/min. After 2 minutes, O2 raised to 82%, and supplemental O2 raised to 2L/min. O2 saturation then raised to 93% and supplemental O2 left on. Recommended to pt/family to have supplemental O2 donned when ambulating from car to home. Pt anticipates d/c this afternoon.   Follow Up Recommendations  Home health PT;Supervision/Assistance - 24 hour     Equipment Recommendations  None recommended by PT    Recommendations for Other Services       Precautions / Restrictions Precautions Precautions: Fall Restrictions Weight Bearing Restrictions: No    Mobility  Bed Mobility               General bed mobility comments: Pt received sitting up in chair.   Transfers Overall transfer level: Needs assistance Equipment used: Rolling walker (2 wheeled) Transfers: Sit to/from Stand Sit to Stand: Supervision         General transfer comment: VC's for hand placement on seated surface for safety.   Ambulation/Gait Ambulation/Gait assistance: Supervision Ambulation Distance (Feet): 100 Feet Assistive device: Rolling walker (2 wheeled) Gait Pattern/deviations: Step-through pattern;Decreased stride length;Trunk flexed Gait velocity: Decreased Gait velocity interpretation: Below normal speed for age/gender General Gait Details: VC's for walker placement closer to pt's body, and  for improved posture.    Stairs            Wheelchair Mobility    Modified Rankin (Stroke Patients Only)       Balance Overall balance assessment: Needs assistance Sitting-balance support: Feet supported;No upper extremity supported Sitting balance-Leahy Scale: Good     Standing balance support: Bilateral upper extremity supported Standing balance-Leahy Scale: Fair                      Cognition Arousal/Alertness: Awake/alert Behavior During Therapy: WFL for tasks assessed/performed Overall Cognitive Status: Within Functional Limits for tasks assessed                      Exercises General Exercises - Lower Extremity Long Arc Quad: 10 reps;Both Hip ABduction/ADduction: 10 reps;Strengthening;Both    General Comments        Pertinent Vitals/Pain See above for O2 levels.     Home Living                      Prior Function            PT Goals (current goals can now be found in the care plan section) Acute Rehab PT Goals Patient Stated Goal: get back to being independent PT Goal Formulation: With patient Time For Goal Achievement: 05/08/13 Potential to Achieve Goals: Good Progress towards PT goals: Progressing toward goals    Frequency  Min 3X/week    PT Plan Current plan remains appropriate    Co-evaluation             End of Session Equipment Utilized During Treatment: Gait belt  Activity Tolerance: Patient tolerated treatment well Patient left: in chair;with call bell/phone within reach;with family/visitor present     Time: [id number] PT Time Calculation (min): 20 min  Charges:  $Gait Training: 8-22 mins                    G Codes:      Ruthann CancerHamilton, Shamell Suarez 04/28/2013, 4:03 PM  Ruthann CancerLaura Hamilton, PT, DPT Acute Rehabilitation Services Pager: [phone]

## 2013-04-28 NOTE — Discharge Summary (Signed)
  Discharge summary dictated on 04/28/2013 dictation number is [phone]

## 2013-04-28 NOTE — Progress Notes (Signed)
Pharmacy Note-Anticoagulation  Pharmacy Consult :  80 y.o. female is currently on Coumadin for atrial fibrillation with RVR.   Latest Labs : Hematology :  Recent Labs  04/26/13 0348 04/27/13 0521 04/28/13 0355  LABPROT 25.4* 22.5* 22.5*  INR 2.40* 2.05* 2.05*  CREATININE 0.88 0.90 0.93    Current Medication[s] Include: Medication PTA: Prescriptions prior to admission  Medication Sig Dispense Refill  . furosemide (LASIX) 40 MG tablet Take 40 mg by mouth daily.      Marland Kitchen. levothyroxine (SYNTHROID, LEVOTHROID) 100 MCG tablet Take 100 mcg by mouth daily.      . traZODone (DESYREL) 50 MG tablet Take 25 mg by mouth at bedtime.      . [DISCONTINUED] amiodarone (PACERONE) 200 MG tablet Take 1 tablet (200 mg total) by mouth daily.      . [DISCONTINUED] metoprolol succinate (TOPROL-XL) 100 MG 24 hr tablet Take 50 mg by mouth daily. Take with or immediately following a meal.      . [DISCONTINUED] warfarin (COUMADIN) 2.5 MG tablet Take 2.5 mg by mouth daily.       Scheduled:  Scheduled:  . amiodarone  200 mg Oral BID  . digoxin  0.0625 mg Oral Daily  . feeding supplement (ENSURE)  1 Container Oral TID BM  . furosemide  20 mg Intravenous Daily  . levothyroxine  100 mcg Oral QAC breakfast  . metoprolol succinate  25 mg Oral Q breakfast  . potassium chloride  20 mEq Oral Daily  . traZODone  25 mg Oral QHS  . Warfarin - Pharmacist Dosing Inpatient   Does not apply q1800   Assessment :  Today's INR 2.05.   INR is at therapeutic goal set by Cardiology, ~ 2.    No bleeding complications observed.  Goal :  INR goal is ~ 2 as set by Cardiology  Plan : 1. Repeat Coumadin 2 mg po today. 2. Daily INR's, CBC. Monitor for bleeding complications  Laurena BeringStramoski, Quinton Voth J, Pharm.D. 04/28/2013  1:46 PM

## 2013-04-29 LAB — CULTURE, BLOOD (ROUTINE X 2)
Culture: NO GROWTH
Culture: NO GROWTH

## 2013-04-29 NOTE — Discharge Summary (Signed)
[name], [name]            ACCOUNT NO.:  [id number]  MEDICAL RECORD NO.:  [id number]  LOCATION:  3E09C                        FACILITY:  [facility]  PHYSICIAN:  Eduardo Osier. Sharyn Lull, M.D. DATE OF BIRTH:  1919/01/22  DATE OF ADMISSION:  04/23/2013 DATE OF DISCHARGE:  04/28/2013                              DISCHARGE SUMMARY   ADMITTING DIAGNOSES: 1. Recurrent persistent atrial fibrillation with rapid ventricular     response. 2. Decompensated congestive heart failure secondary to preserved     systolic function. 3. Severe aortic stenosis. 4. Hypertension. 5. Tachy-brady syndrome status post permanent pacemaker. 6. Hypothyroidism. 7. Hypercholesteremia. 8. Degenerative joint disease. 9. History of left hip fracture in the past.  FINAL DIAGNOSES: 1. Chronic persistent atrial fibrillation, now controlled ventricular     response. 2. Compensated acute on chronic congestive heart failure secondary to     preserved left ventricular systolic function. 3. Severe calcific aortic stenosis. 4. Hypertension. 5. Tachy-brady syndrome status post permanent pacemaker. 6. Hypothyroidism. 7. Hypercholesteremia. 8. Degenerative joint disease. 9. History of left hip fracture in the past. 10.Hypoalbuminemia.  DISCHARGE HOME MEDICATIONS: 1. Digoxin 0.625 mg, 1 tablet daily. 2. Potassium chloride 10 mEq, 1 tablet daily. 3. Amiodarone 200 mg, 1 tablet twice daily for 1 week and will be     reduced to once daily at my next office visit. 4. Metoprolol succinate 25 mg, 1 tablet daily. 5. Warfarin 2 mg, 1 tablet daily. 6. Lasix 40 mg, 1 tablet daily. 7. Levothyroxine 100 mcg daily. 8. Trazodone 25 mg daily at bedtime.  DIET:  Low salt, low cholesterol.  ACTIVITY:  Increase activity slowly as tolerated with assistance only.  CONDITION AT DISCHARGE:  Stable.  BRIEF HISTORY AND HOSPITAL COURSE:  [name] is a 80 year old female with past medical history significant for severe aortic  stenosis, hypertension, history of AFib with RVR in the past, hypercholesteremia, hypothyroidism, tachy-brady syndrome status post permanent pacemaker in the past, degenerative joint disease, history of left hip fracture in the past.  She came to the ER complaining of progressive increasing shortness of breath associated with fatigue, leg swelling for the last few weeks.  The patient states she has been followed at [facility] in [place], and was admitted for approximately 1 week last week and had attempted cardioversion x3 without success.  States seen in cardiologist and had interrogation of the pacemaker and was noted to have flipped back in AFib since December 2014.  The patient denies any chest pain, nausea, vomiting, diaphoresis.  Denies any PND, orthopnea, but complains of leg swelling.  Denies any syncopal episodes.  States she was restarted on amiodarone and Lasix last week with slight improvement in her leg swelling.  PHYSICAL EXAMINATION:  GENERAL:  She was alert, awake, oriented x3. VITAL SIGNS:  Blood pressure was 125/90, pulse was 102, she was afebrile. EYES:  Conjunctivae was pink. NECK:  Supple.  Positive JVD. CARDIOVASCULAR:  Irregularly irregular.  She had 3/6 systolic murmur and soft diastolic murmur and S3 gallop noted. LUNGS:  She has decreased breath sounds with fine rales. ABDOMEN:  Soft.  Bowel sounds were present.  Nontender. EXTREMITIES:  No clubbing, cyanosis.  There was 3+ edema noted.  LABORATORY DATA:  Her sodium was 145, potassium 4.2, BUN 39, creatinine 1.14.  ProBNP was 5785.  Hemoglobin was 13.2, hematocrit 41.6, white count of 7.1.  Her 3 sets of cardiac enzymes were negative.  Her last labs; sodium 144, potassium 4.3, BUN 29, creatinine 0.93.  Albumin was low at 2.9, INR is 2.05.  Last EKG showed ventricular paced rhythm.  BRIEF HOSPITAL COURSE:  The patient was admitted to telemetry unit.  The patient was started on IV Lasix and received IV Lasix and  amiodarone dose was increased to 400 mg twice daily, which was switched to 200 twice daily.  The patient's leg swelling has markedly improved, but continues to have 1 to 2+ edema.  Her breathing has markedly improved. OT/PT consultation was obtained.  The patient is ambulating in the room with assistance.  The patient was not felt to be good candidate for AVR, in fact, the [facility] which [facility].  The patient will be discharged home and will be followed up in my office in 1 week and will adjust her dose of Coumadin and amiodarone as an outpatient.     Eduardo OsierMohan N. [name], M.D.     MNH/MEDQ  D:  04/28/2013  T:  04/29/2013  Job:  [id number]

## 2013-06-27 ENCOUNTER — Inpatient Hospital Stay (HOSPITAL_COMMUNITY)
Admission: EM | Admit: 2013-06-27 | Discharge: 2013-06-29 | DRG: 379 | Disposition: A | Discharge status: Home / Self Care | Payer: Medicare Other | Attending: Cardiology | Admitting: Cardiology

## 2013-06-27 ENCOUNTER — Encounter (HOSPITAL_COMMUNITY): Payer: Self-pay | Admitting: Emergency Medicine

## 2013-06-27 DIAGNOSIS — Z96649 Presence of unspecified artificial hip joint: Secondary | ICD-10-CM

## 2013-06-27 DIAGNOSIS — K922 Gastrointestinal hemorrhage, unspecified: Secondary | ICD-10-CM | POA: Diagnosis present

## 2013-06-27 DIAGNOSIS — Z950 Presence of cardiac pacemaker: Secondary | ICD-10-CM

## 2013-06-27 DIAGNOSIS — Z79899 Other long term (current) drug therapy: Secondary | ICD-10-CM

## 2013-06-27 DIAGNOSIS — I10 Essential (primary) hypertension: Secondary | ICD-10-CM | POA: Diagnosis present

## 2013-06-27 DIAGNOSIS — E785 Hyperlipidemia, unspecified: Secondary | ICD-10-CM | POA: Diagnosis present

## 2013-06-27 DIAGNOSIS — I4891 Unspecified atrial fibrillation: Secondary | ICD-10-CM | POA: Diagnosis present

## 2013-06-27 DIAGNOSIS — Z818 Family history of other mental and behavioral disorders: Secondary | ICD-10-CM

## 2013-06-27 DIAGNOSIS — K5731 Diverticulosis of large intestine without perforation or abscess with bleeding: Principal | ICD-10-CM | POA: Diagnosis present

## 2013-06-27 DIAGNOSIS — E039 Hypothyroidism, unspecified: Secondary | ICD-10-CM | POA: Diagnosis present

## 2013-06-27 LAB — COMPREHENSIVE METABOLIC PANEL
ALT: 29 U/L (ref 0–35)
AST: 36 U/L (ref 0–37)
Albumin: 3.6 g/dL (ref 3.5–5.2)
Alkaline Phosphatase: 70 U/L (ref 39–117)
BILIRUBIN TOTAL: 0.3 mg/dL (ref 0.3–1.2)
BUN: 34 mg/dL — ABNORMAL HIGH (ref 6–23)
CHLORIDE: 101 meq/L (ref 96–112)
CO2: 29 mEq/L (ref 19–32)
Calcium: 8.9 mg/dL (ref 8.4–10.5)
Creatinine, Ser: 0.94 mg/dL (ref 0.50–1.10)
GFR calc Af Amer: 59 mL/min — ABNORMAL LOW (ref >90)
GFR calc non Af Amer: 51 mL/min — ABNORMAL LOW (ref >90)
Glucose, Bld: 100 mg/dL — ABNORMAL HIGH (ref 70–99)
POTASSIUM: 4.5 meq/L (ref 3.7–5.3)
Sodium: 140 mEq/L (ref 137–147)
TOTAL PROTEIN: 6.9 g/dL (ref 6.0–8.3)

## 2013-06-27 LAB — CBC WITH DIFFERENTIAL/PLATELET
BASOS ABS: 0 10*3/uL (ref 0.0–0.1)
Basophils Relative: 0 % (ref 0–1)
Eosinophils Absolute: 0.1 10*3/uL (ref 0.0–0.7)
Eosinophils Relative: 1 % (ref 0–5)
HCT: 40.7 % (ref 36.0–46.0)
Hemoglobin: 12.4 g/dL (ref 12.0–15.0)
LYMPHS ABS: 2 10*3/uL (ref 0.7–4.0)
Lymphocytes Relative: 24 % (ref 12–46)
MCH: 25.8 pg — ABNORMAL LOW (ref 26.0–34.0)
MCHC: 30.5 g/dL (ref 30.0–36.0)
MCV: 84.6 fL (ref 78.0–100.0)
Monocytes Absolute: 0.5 10*3/uL (ref 0.1–1.0)
Monocytes Relative: 7 % (ref 3–12)
NEUTROS PCT: 68 % (ref 43–77)
Neutro Abs: 5.6 10*3/uL (ref 1.7–7.7)
Platelets: 257 10*3/uL (ref 150–400)
RBC: 4.81 MIL/uL (ref 3.87–5.11)
RDW: 19.7 % — AB (ref 11.5–15.5)
WBC: 8.2 10*3/uL (ref 4.0–10.5)

## 2013-06-27 LAB — POC OCCULT BLOOD, ED: Fecal Occult Bld: POSITIVE — AB

## 2013-06-27 LAB — SAMPLE TO BLOOD BANK

## 2013-06-27 LAB — PROTIME-INR
INR: 1.13 (ref 0.00–1.49)
PROTHROMBIN TIME: 14.3 s (ref 11.6–15.2)

## 2013-06-27 NOTE — ED Provider Notes (Signed)
CSN: [id number]     Arrival date & time 06/27/13  1752 History   First MD Initiated Contact with Patient 06/27/13 2256     Chief Complaint  Patient presents with  . Rectal Bleeding     (Consider location/radiation/quality/duration/timing/severity/associated sxs/prior Treatment) Patient is a 80 y.o. female presenting with hematochezia. The history is provided by the patient and a significant other. No language interpreter was used.  Rectal Bleeding Quality:  Maroon Amount:  Moderate Duration:  2 days Timing:  Intermittent Progression:  Worsening Chronicity:  New Context: spontaneously   Similar prior episodes: yes   Worsened by:  Defecation Ineffective treatments:  None tried Associated symptoms: light-headedness   Associated symptoms: no abdominal pain and no fever     Past Medical History  Diagnosis Date  . Pacemaker   . A-fib   . Hypertension   . Hypothyroidism   . Hyperlipemia    Past Surgical History  Procedure Laterality Date  . Humerus fracture surgery    . Hip arthroplasty  08/11/2011    Procedure: ARTHROPLASTY BIPOLAR HIP;  Surgeon: Shelda PalMatthew D Olin, MD;  [facility];  Service: Orthopedics;  Laterality: Left;   Family History  Problem Relation Age of Onset  . Bipolar disorder Mother    History  Substance Use Topics  . Smoking status: Never Smoker   . Smokeless tobacco: Never Used  . Alcohol Use: 0.0 oz/week     Comment: once a week, on Sunday   OB History   Grav Para Term Preterm Abortions TAB SAB Ect Mult Living                 Review of Systems  Constitutional: Positive for fatigue. Negative for fever.  Respiratory: Negative for shortness of breath.   Cardiovascular: Negative for chest pain.  Gastrointestinal: Positive for blood in stool and hematochezia. Negative for abdominal pain and rectal pain.  Neurological: Positive for weakness and light-headedness.  All other systems reviewed and are negative.     Allergies  Review of patient's  allergies indicates no known allergies.  Home Medications   Prior to Admission medications   Medication Sig Start Date End Date Taking? Authorizing Provider  amiodarone (PACERONE) 200 MG tablet Take 1 tablet (200 mg total) by mouth 2 (two) times daily. 04/28/13   Robynn PaneMohan N Harwani, MD  digoxin 62.5 MCG TABS Take 0.0625 mg by mouth daily. 04/28/13   Robynn PaneMohan N Harwani, MD  furosemide (LASIX) 40 MG tablet Take 40 mg by mouth daily.    Historical Provider, MD  levothyroxine (SYNTHROID, LEVOTHROID) 100 MCG tablet Take 100 mcg by mouth daily.    Historical Provider, MD  metoprolol succinate (TOPROL-XL) 25 MG 24 hr tablet Take 1 tablet (25 mg total) by mouth daily with breakfast. 04/28/13   Robynn PaneMohan N Harwani, MD  potassium chloride SA (K-DUR,KLOR-CON) 20 MEQ tablet Take 0.5 tablets (10 mEq total) by mouth daily. 04/28/13   Robynn PaneMohan N Harwani, MD  traZODone (DESYREL) 50 MG tablet Take 25 mg by mouth at bedtime.    Historical Provider, MD  warfarin (COUMADIN) 2 MG tablet Take 1 tablet (2 mg total) by mouth daily. 04/28/13   Robynn PaneMohan N Harwani, MD   BP 131/74  Pulse 60  Temp(Src) 98.1 F (36.7 C) (Oral)  Resp 13  Ht 5\' 3"  (1.6 m)  Wt 120 lb (54.432 kg)  BMI 21.26 kg/m2  SpO2 98% Physical Exam  Nursing note and vitals reviewed. Constitutional: She is oriented to person, place, and time.  She appears well-developed and well-nourished.  HENT:  Head: Normocephalic.  Eyes: Pupils are equal, round, and reactive to light.  Neck: Neck supple.  Cardiovascular: Normal rate, regular rhythm and intact distal pulses.   Pulmonary/Chest: Effort normal and breath sounds normal. No respiratory distress.  Abdominal: Soft. She exhibits no distension. There is no tenderness.  Genitourinary:  Blood noted at rectal vault.  No apparent external hemorrhoid noted.  Musculoskeletal: She exhibits no edema and no tenderness.  Lymphadenopathy:    She has no cervical adenopathy.  Neurological: She is alert and oriented to person, place,  and time.  Skin: Skin is warm and dry.  Psychiatric: She has a normal mood and affect.    ED Course  Procedures (including critical care time) Labs Review Labs Reviewed  CBC WITH DIFFERENTIAL - Abnormal; Notable for the following:    MCH 25.8 (*)    RDW 19.7 (*)    All other components within normal limits  COMPREHENSIVE METABOLIC PANEL - Abnormal; Notable for the following:    Glucose, Bld 100 (*)    BUN 34 (*)    GFR calc non Af Amer 51 (*)    GFR calc Af Amer 59 (*)    All other components within normal limits  POC OCCULT BLOOD, ED - Abnormal; Notable for the following:    Fecal Occult Bld POSITIVE (*)    All other components within normal limits  PROTIME-INR  SAMPLE TO BLOOD BANK    Imaging Review No results found.   EKG Interpretation None     Patient discussed with and seen by Dr. Fonnie Jarvis. No orthostasis.  Normal H/H, INR.    Dr. Algie Coffer will admit for Dr. Sharyn Lull. MDM   Final diagnoses:  None    Lower GI bleed.    Jimmye Norman, NP 06/28/13 0100  Jimmye Norman, NP 06/28/13 [phone]

## 2013-06-27 NOTE — ED Provider Notes (Signed)
Medical screening examination/treatment/procedure(s) were conducted as a shared visit with non-physician practitioner(s) and myself.  I personally evaluated the patient during the encounter.   EKG Interpretation   Date/Time:  Saturday June 28 2013 00:01:07 EDT Ventricular Rate:  60 PR Interval:  248 QRS Duration: 147 QT Interval:  458 QTC Calculation: 458 R Axis:   -79 Text Interpretation:  Sinus or ectopic atrial rhythm Atrial premature  complex Prolonged PR interval Left bundle branch block Compared to  previous tracing VENTRICULAR PACED RHYTHM NO LONGER PRESENT Confirmed by  Fonnie Jarvis  MD, [name] ([id number]) on 06/28/2013 2:15:40 AM     80 year old female on Coumadin for chronic atrial fibrillation presents with maroon stools today without chest pain or shortness of breath or syncope.  Hurman Horn, MD 06/29/13 [phone]

## 2013-06-27 NOTE — ED Notes (Signed)
The pt is c/o bright red bleeding from her rectum today.  The pt reports that she has hemorrhoids and has problems with them occassionally

## 2013-06-27 NOTE — ED Notes (Signed)
NP at BS.

## 2013-06-28 ENCOUNTER — Encounter (HOSPITAL_COMMUNITY): Payer: Self-pay

## 2013-06-28 DIAGNOSIS — K922 Gastrointestinal hemorrhage, unspecified: Secondary | ICD-10-CM | POA: Diagnosis present

## 2013-06-28 LAB — BASIC METABOLIC PANEL
BUN: 31 mg/dL — AB (ref 6–23)
CALCIUM: 9 mg/dL (ref 8.4–10.5)
CO2: 28 meq/L (ref 19–32)
CREATININE: 0.79 mg/dL (ref 0.50–1.10)
Chloride: 102 mEq/L (ref 96–112)
GFR calc Af Amer: 81 mL/min — ABNORMAL LOW (ref >90)
GFR calc non Af Amer: 70 mL/min — ABNORMAL LOW (ref >90)
Glucose, Bld: 83 mg/dL (ref 70–99)
Potassium: 3.7 mEq/L (ref 3.7–5.3)
Sodium: 141 mEq/L (ref 137–147)

## 2013-06-28 LAB — PROTIME-INR
INR: 1.31 (ref 0.00–1.49)
Prothrombin Time: 16 seconds — ABNORMAL HIGH (ref 11.6–15.2)

## 2013-06-28 LAB — CBC
HCT: 41.2 % (ref 36.0–46.0)
Hemoglobin: 12.8 g/dL (ref 12.0–15.0)
MCH: 26.1 pg (ref 26.0–34.0)
MCHC: 31.1 g/dL (ref 30.0–36.0)
MCV: 83.9 fL (ref 78.0–100.0)
Platelets: 250 10*3/uL (ref 150–400)
RBC: 4.91 MIL/uL (ref 3.87–5.11)
RDW: 19.9 % — AB (ref 11.5–15.5)
WBC: 7.4 10*3/uL (ref 4.0–10.5)

## 2013-06-28 MED ORDER — DOCUSATE SODIUM 100 MG PO CAPS
100.0000 mg | ORAL_CAPSULE | Freq: Two times a day (BID) | ORAL | Status: DC
  Administered 2013-06-28 – 2013-06-29 (×2): 100 mg via ORAL
  Filled 2013-06-28 (×5): qty 1

## 2013-06-28 MED ORDER — SODIUM CHLORIDE 0.9 % IV SOLN: 250.0000 mL | INTRAVENOUS | Status: DC | PRN

## 2013-06-28 MED ORDER — SODIUM CHLORIDE 0.9 % IJ SOLN: 3.0000 mL | Freq: Two times a day (BID) | INTRAMUSCULAR | Status: DC

## 2013-06-28 MED ORDER — DIGOXIN 0.0625 MG HALF TABLET
0.0625 mg | ORAL_TABLET | Freq: Every day | ORAL | Status: DC
  Administered 2013-06-28 – 2013-06-29 (×2): 0.0625 mg via ORAL
  Filled 2013-06-28 (×2): qty 1

## 2013-06-28 MED ORDER — TRAZODONE 25 MG HALF TABLET
25.0000 mg | ORAL_TABLET | Freq: Every day | ORAL | Status: DC
  Administered 2013-06-28: 25 mg via ORAL
  Filled 2013-06-28 (×2): qty 1

## 2013-06-28 MED ORDER — SODIUM CHLORIDE 0.9 % IJ SOLN: 3.0000 mL | INTRAMUSCULAR | Status: DC | PRN

## 2013-06-28 MED ORDER — PANTOPRAZOLE SODIUM 40 MG IV SOLR
40.0000 mg | INTRAVENOUS | Status: DC
  Administered 2013-06-28: 40 mg via INTRAVENOUS
  Filled 2013-06-28 (×2): qty 40

## 2013-06-28 MED ORDER — ONDANSETRON HCL 4 MG/2ML IJ SOLN: 4.0000 mg | Freq: Four times a day (QID) | INTRAMUSCULAR | Status: DC | PRN

## 2013-06-28 MED ORDER — AMIODARONE HCL 200 MG PO TABS
200.0000 mg | ORAL_TABLET | Freq: Every day | ORAL | Status: DC
  Administered 2013-06-28 – 2013-06-29 (×2): 200 mg via ORAL
  Filled 2013-06-28 (×2): qty 1

## 2013-06-28 MED ORDER — ONDANSETRON HCL 4 MG PO TABS: 4.0000 mg | ORAL_TABLET | Freq: Four times a day (QID) | ORAL | Status: DC | PRN

## 2013-06-28 MED ORDER — SODIUM CHLORIDE 0.9 % IJ SOLN
3.0000 mL | Freq: Two times a day (BID) | INTRAMUSCULAR | Status: DC
Start: 2013-06-28 — End: 2013-06-28
  Administered 2013-06-28: 3 mL via INTRAVENOUS

## 2013-06-28 MED ORDER — POTASSIUM CHLORIDE CRYS ER 10 MEQ PO TBCR
10.0000 meq | EXTENDED_RELEASE_TABLET | Freq: Every day | ORAL | Status: DC
  Administered 2013-06-28 – 2013-06-29 (×2): 10 meq via ORAL
  Filled 2013-06-28 (×2): qty 1

## 2013-06-28 MED ORDER — ACETAMINOPHEN 325 MG PO TABS: 650.0000 mg | ORAL_TABLET | Freq: Four times a day (QID) | ORAL | Status: DC | PRN

## 2013-06-28 MED ORDER — LEVOTHYROXINE SODIUM 100 MCG PO TABS
100.0000 ug | ORAL_TABLET | Freq: Every day | ORAL | Status: DC
  Administered 2013-06-28 – 2013-06-29 (×2): 100 ug via ORAL
  Filled 2013-06-28 (×4): qty 1

## 2013-06-28 MED ORDER — METOPROLOL SUCCINATE ER 50 MG PO TB24
50.0000 mg | ORAL_TABLET | Freq: Every day | ORAL | Status: DC
  Administered 2013-06-28 – 2013-06-29 (×2): 50 mg via ORAL
  Filled 2013-06-28 (×2): qty 1

## 2013-06-28 MED ORDER — ACETAMINOPHEN 650 MG RE SUPP
650.0000 mg | Freq: Four times a day (QID) | RECTAL | Status: DC | PRN
Start: 2013-06-28 — End: 2013-06-29

## 2013-06-28 NOTE — H&P (Signed)
Referring Physician: Charolette Forward  [name] is an 80 y.o. female.                       Chief Complaint: Rectal bleed  HPI: 80 year old female with 2 day history of painless, intermittent hematochezia. No nausea, vomiting or diarrhea. INR was low at 1.13  Past Medical History  Diagnosis Date  . Pacemaker   . A-fib   . Hypertension   . Hypothyroidism   . Hyperlipemia       Past Surgical History  Procedure Laterality Date  . Humerus fracture surgery    . Hip arthroplasty  08/11/2011    Procedure: ARTHROPLASTY BIPOLAR HIP;  Surgeon: Mauri Pole, MD;  Location: [place];  Service: Orthopedics;  Laterality: Left;    Family History  Problem Relation Age of Onset  . Bipolar disorder Mother    Social History:  reports that she has never smoked. She has never used smokeless tobacco. She reports that she drinks alcohol. She reports that she does not use illicit drugs.  Allergies: No Known Allergies   (Not in a hospital admission)  Results for orders placed during the hospital encounter of 06/27/13 (from the past 48 hour(s))  CBC WITH DIFFERENTIAL     Status: Abnormal   Collection Time    06/27/13  6:24 PM      Result Value Ref Range   WBC 8.2  4.0 - 10.5 K/uL   RBC 4.81  3.87 - 5.11 MIL/uL   Hemoglobin 12.4  12.0 - 15.0 g/dL   HCT 40.7  36.0 - 46.0 %   MCV 84.6  78.0 - 100.0 fL   MCH 25.8 (*) 26.0 - 34.0 pg   MCHC 30.5  30.0 - 36.0 g/dL   RDW 19.7 (*) 11.5 - 15.5 %   Platelets 257  150 - 400 K/uL   Neutrophils Relative % 68  43 - 77 %   Neutro Abs 5.6  1.7 - 7.7 K/uL   Lymphocytes Relative 24  12 - 46 %   Lymphs Abs 2.0  0.7 - 4.0 K/uL   Monocytes Relative 7  3 - 12 %   Monocytes Absolute 0.5  0.1 - 1.0 K/uL   Eosinophils Relative 1  0 - 5 %   Eosinophils Absolute 0.1  0.0 - 0.7 K/uL   Basophils Relative 0  0 - 1 %   Basophils Absolute 0.0  0.0 - 0.1 K/uL  PROTIME-INR     Status: None   Collection Time    06/27/13  6:24 PM      Result Value Ref Range   Prothrombin Time 14.3  11.6 - 15.2 seconds   INR 1.13  0.00 - 1.49  COMPREHENSIVE METABOLIC PANEL     Status: Abnormal   Collection Time    06/27/13  6:24 PM      Result Value Ref Range   Sodium 140  137 - 147 mEq/L   Potassium 4.5  3.7 - 5.3 mEq/L   Chloride 101  96 - 112 mEq/L   CO2 29  19 - 32 mEq/L   Glucose, Bld 100 (*) 70 - 99 mg/dL   BUN 34 (*) 6 - 23 mg/dL   Creatinine, Ser 0.94  0.50 - 1.10 mg/dL   Calcium 8.9  8.4 - 10.5 mg/dL   Total Protein 6.9  6.0 - 8.3 g/dL   Albumin 3.6  3.5 - 5.2 g/dL   AST 36  0 - 37 U/L   ALT 29  0 - 35 U/L   Alkaline Phosphatase 70  39 - 117 U/L   Total Bilirubin 0.3  0.3 - 1.2 mg/dL   GFR calc non Af Amer 51 (*) >90 mL/min   GFR calc Af Amer 59 (*) >90 mL/min   Comment: (NOTE)     The eGFR has been calculated using the CKD EPI equation.     This calculation has not been validated in all clinical situations.     eGFR's persistently <90 mL/min signify possible Chronic Kidney     Disease.  SAMPLE TO BLOOD BANK     Status: None   Collection Time    06/27/13  7:28 PM      Result Value Ref Range   Blood Bank Specimen SAMPLE AVAILABLE FOR TESTING     Sample Expiration 06/28/2013    POC OCCULT BLOOD, ED     Status: Abnormal   Collection Time    06/27/13 11:13 PM      Result Value Ref Range   Fecal Occult Bld POSITIVE (*) NEGATIVE   No results found.  Review Of Systems Constitutional: Positive for fatigue. Negative for fever.  Respiratory: Negative for shortness of breath.  Cardiovascular: Negative for chest pain.  Gastrointestinal: Positive for blood in stool and hematochezia. Negative for abdominal pain and rectal pain.  Neurological: Positive for weakness and light-headedness.  All other systems reviewed and are negative.   Blood pressure 164/77, pulse 63, temperature 98.1 F (36.7 C), temperature source Oral, resp. rate 19, height 5' 3" (1.6 m), weight 54.432 kg (120 lb), SpO2 97.00%.  Physical Exam  Nursing note and vitals  reviewed.  Constitutional: She is oriented to person, place, and time. She appears well-developed and well-nourished.  HENT: Head: Normocephalic.  Eyes: Pupils are equal, round, and reactive to light. Conj-pink, Sclera-white. Neck: Neck supple.  Cardiovascular: Normal rate, regular rhythm and intact distal pulses. III/VI systolic murmur Pulmonary/Chest: Effort normal and breath sounds normal. No respiratory distress.  Abdominal: Soft. She exhibits no distension. There is no tenderness.  Musculoskeletal: She exhibits no edema and no tenderness.  Lymphadenopathy: She has no cervical adenopathy.  Neurological: She is alert and oriented to person, place, and time. Moves all 4 extremities.  Skin: Skin is warm and dry.  Psychiatric: She has a normal mood and affect.    Assessment/Plan Acute GI bleed S/P dual chamber pacemaker Atrial fibrillation Hypertension Hypothyroidism Dyslipidemia  Admit Home medications except warfarin. GI consult in AM.  Birdie Riddle 06/28/2013, 1:28 AM

## 2013-06-28 NOTE — Progress Notes (Signed)
Pt was found fully dressed and had taken off monitor saying " I am going home the Dr. York Spaniel I could leave. Dr. Algie Coffer. I tried to explain to patient that she had to wait for orders and she was trying to go out the back exit and  In trying to get her to get to her room she refused and her son [name] was called. Patient sitting at nurses station.

## 2013-06-28 NOTE — Progress Notes (Signed)
[name] [id number] Admitted to 6E24: 06/28/2013 2:36 AM Attending Provider: Robynn Pane, MD   Pt and son orientation to unit, room and routine. Information packet given to patient and son.  Admission INP armband ID verified with patient and in place. Side rails in place, fall risk assessment complete with patient verbalizing understanding of risks associated with falls. Pt verbalizes an understanding of how to use the call bell and to call for help before getting out of bed.   Will cont to monitor and assist as needed.  Gilman Schmidt, RN 06/28/2013 2:36 AM

## 2013-06-28 NOTE — Progress Notes (Signed)
Ref: PROVIDER NOT IN SYSTEM   Subjective:  Feeling weak. T max 98.1 F  Objective:  Vital Signs in the last 24 hours: Temp:  [97.6 F (36.4 C)-98.1 F (36.7 C)] 98 F (36.7 C) (06/06 0612) Pulse Rate:  [58-80] 80 (06/06 0612) Cardiac Rhythm:  [-] Atrial fibrillation (06/06 0856) Resp:  [13-24] 18 (06/06 0612) BP: (120-178)/(66-92) 120/79 mmHg (06/06 0612) SpO2:  [93 %-99 %] 98 % (06/06 0612) Weight:  [49 kg (108 lb 0.4 oz)-54.432 kg (120 lb)] 49 kg (108 lb 0.4 oz) (06/06 0211)  Physical Exam: BP Readings from Last 1 Encounters:  06/28/13 120/79    Wt Readings from Last 1 Encounters:  06/28/13 49 kg (108 lb 0.4 oz)    Weight change:   HEENT: [place]/AT, Eyes-Hazel, PERL, EOMI, Conjunctiva-Pink, Sclera-Non-icteric Neck: No JVD, No bruit, Trachea midline. Lungs:  Clear, Bilateral. Cardiac:  Regular rhythm, normal S1 and S2, no S3. III/VI systolic murmur. Abdomen:  Soft, non-tender. Extremities:  No edema present. No cyanosis. No clubbing. CNS: AxOx3, Cranial nerves grossly intact, moves all 4 extremities. Right handed. Skin: Warm and dry.   Intake/Output from previous day:      Lab Results: BMET    Component Value Date/Time   NA 141 06/28/2013 0500   NA 140 06/27/2013 1824   NA 144 04/28/2013 0355   K 3.7 06/28/2013 0500   K 4.5 06/27/2013 1824   K 4.3 04/28/2013 0355   CL 102 06/28/2013 0500   CL 101 06/27/2013 1824   CL 101 04/28/2013 0355   CO2 28 06/28/2013 0500   CO2 29 06/27/2013 1824   CO2 30 04/28/2013 0355   GLUCOSE 83 06/28/2013 0500   GLUCOSE 100* 06/27/2013 1824   GLUCOSE 112* 04/28/2013 0355   BUN 31* 06/28/2013 0500   BUN 34* 06/27/2013 1824   BUN 29* 04/28/2013 0355   CREATININE 0.79 06/28/2013 0500   CREATININE 0.94 06/27/2013 1824   CREATININE 0.93 04/28/2013 0355   CALCIUM 9.0 06/28/2013 0500   CALCIUM 8.9 06/27/2013 1824   CALCIUM 8.2* 04/28/2013 0355   GFRNONAA 70* 06/28/2013 0500   GFRNONAA 51* 06/27/2013 1824   GFRNONAA 52* 04/28/2013 0355   GFRAA 81* 06/28/2013 0500   GFRAA 59* 06/27/2013  1824   GFRAA 60* 04/28/2013 0355   CBC    Component Value Date/Time   WBC 7.4 06/28/2013 0500   RBC 4.91 06/28/2013 0500   HGB 12.8 06/28/2013 0500   HCT 41.2 06/28/2013 0500   PLT 250 06/28/2013 0500   MCV 83.9 06/28/2013 0500   MCH 26.1 06/28/2013 0500   MCHC 31.1 06/28/2013 0500   RDW 19.9* 06/28/2013 0500   LYMPHSABS 2.0 06/27/2013 1824   MONOABS 0.5 06/27/2013 1824   EOSABS 0.1 06/27/2013 1824   BASOSABS 0.0 06/27/2013 1824   HEPATIC Function Panel  Recent Labs  04/27/13 0521 04/28/13 0355 06/27/13 1824  PROT 5.6* 5.6* 6.9   HEMOGLOBIN A1C No components found with this basename: HGA1C,  MPG   CARDIAC ENZYMES Lab Results  Component Value Date   CKTOTAL 180* 08/11/2011   CKMB 3.6 08/11/2011   TROPONINI <0.30 04/23/2013   TROPONINI <0.30 04/23/2013   TROPONINI <0.30 04/23/2013   BNP  Recent Labs  04/23/13 0949 04/26/13 0348 04/28/13 0355  PROBNP 5289.0* 4369.0* 4959.0*   TSH  Recent Labs  04/23/13 0949  TSH 5.630*   CHOLESTEROL  Recent Labs  04/23/13 0949  CHOL 125    Scheduled Meds: . amiodarone  200 mg Oral  Daily  . digoxin  0.0625 mg Oral Daily  . docusate sodium  100 mg Oral BID  . levothyroxine  100 mcg Oral QAC breakfast  . metoprolol succinate  50 mg Oral Daily  . pantoprazole (PROTONIX) IV  40 mg Intravenous Q24H  . potassium chloride SA  10 mEq Oral Daily  . traZODone  25 mg Oral QHS   Continuous Infusions:  PRN Meds:.acetaminophen, acetaminophen, ondansetron (ZOFRAN) IV, ondansetron  Assessment/Plan: Acute GI bleed  S/P dual chamber pacemaker  Atrial fibrillation  Hypertension  Hypothyroidism  Dyslipidemia  Add Protonix. Medical treatment per patient. Most likely bleed is diverticular bleed per GI on call.     LOS: 1 day    Orpah CobbAjay Thelda Gagan  MD  06/28/2013, 10:39 AM

## 2013-06-28 NOTE — ED Notes (Signed)
Completed EKG.  Trying to find EDP to give it to.

## 2013-06-29 LAB — CBC
HCT: 39.7 % (ref 36.0–46.0)
HEMOGLOBIN: 12 g/dL (ref 12.0–15.0)
MCH: 25.5 pg — AB (ref 26.0–34.0)
MCHC: 30.2 g/dL (ref 30.0–36.0)
MCV: 84.3 fL (ref 78.0–100.0)
Platelets: 242 10*3/uL (ref 150–400)
RBC: 4.71 MIL/uL (ref 3.87–5.11)
RDW: 20.2 % — ABNORMAL HIGH (ref 11.5–15.5)
WBC: 7.8 10*3/uL (ref 4.0–10.5)

## 2013-06-29 LAB — PROTIME-INR
INR: 1.25 (ref 0.00–1.49)
Prothrombin Time: 15.4 seconds — ABNORMAL HIGH (ref 11.6–15.2)

## 2013-06-29 MED ORDER — PANTOPRAZOLE SODIUM 40 MG PO TBEC: 40.0000 mg | DELAYED_RELEASE_TABLET | Freq: Every day | ORAL | Status: DC

## 2013-06-29 NOTE — Discharge Summary (Signed)
Physician Discharge Summary  Patient ID: [name] MRN: [id number] DOB/AGE: 78/15/1920 78 y.o.  Admit date: 06/27/2013 Discharge date: 06/29/2013  Admission Diagnoses: Acute GI bleed  S/P dual chamber pacemaker  Atrial fibrillation  Hypertension  Hypothyroidism  Dyslipidemia  Discharge Diagnoses:  Peinciple Problem: * Acute lower GI bleeding * S/P dual chamber pacemaker  Atrial fibrillation  Hypertension  Hypothyroidism  Dyslipidemia  Discharged Condition: fair  Hospital Course: 80 year old female with 80 year old female with 2 day history of painless, intermittent hematochezia. 2 day history of painless, intermittent hematochezia. No nausea, vomiting or diarrhea. INR was low at 1.13 to 1.31. Her coumadin was held. Discussed caes with GI doctor on call. Per Dr. Juanda Chance her GI bleed is most likely a diverticular bleed. Her coumadin was discontinued. Her Hgb remained stable and she was discharged home with follow up by Dr. Sharyn Lull in 1 week.  Consults: None  Significant Diagnostic Studies: labs: Near normal CBC and CMET except mildly elevated BUN.  Treatments: IV hydration and Protonix.  Discharge Exam: Blood pressure 149/69, pulse 60, temperature 97.4 F (36.3 C), temperature source Oral, resp. rate 16, height 5\' 2"  (1.575 m), weight 48 kg (105 lb 13.1 oz), SpO2 95.00%. HEENT: [place]/AT, Eyes-Hazel, PERL, EOMI, Conjunctiva-Pink, Sclera-Non-icteric  Neck: No JVD, No bruit, Trachea midline.  Lungs: Clear, Bilateral.  Cardiac: Regular rhythm, normal S1 and S2, no S3. III/VI systolic murmur.  Abdomen: Soft, non-tender.  Extremities: No edema present. No cyanosis. No clubbing.  CNS: AxOx3, Cranial nerves grossly intact, moves all 4 extremities. Right handed.  Skin: Warm and dry.   Disposition: 01-Home or Self Care     Medication List    STOP taking these medications       furosemide 40 MG tablet  Commonly known as:  LASIX     warfarin 2 MG tablet  Commonly known as:  COUMADIN      TAKE these medications       amiodarone 200 MG tablet   Commonly known as:  PACERONE  Take 200 mg by mouth daily.     digoxin 0.125 MG tablet  Commonly known as:  LANOXIN  Take 0.0625 mg by mouth daily.     levothyroxine 100 MCG tablet  Commonly known as:  SYNTHROID, LEVOTHROID  Take 100 mcg by mouth daily.     metoprolol succinate 50 MG 24 hr tablet  Commonly known as:  TOPROL-XL  Take 50 mg by mouth daily. Take with or immediately following a meal.     potassium chloride SA 20 MEQ tablet  Commonly known as:  K-DUR,KLOR-CON  Take 0.5 tablets (10 mEq total) by mouth daily.     traZODone 50 MG tablet  Commonly known as:  DESYREL  Take 25 mg by mouth at bedtime.      Protonix 40 mg. One daily.     Follow-up Information   Follow up with Robynn Pane, MD. Schedule an appointment as soon as possible for a visit in 1 week.   Specialty:  Cardiology   Contact information:   [address] [place] [address] [phone]       Signed: Ricki Rodriguez 06/29/2013, 4:49 PM

## 2013-10-20 ENCOUNTER — Ambulatory Visit (INDEPENDENT_AMBULATORY_CARE_PROVIDER_SITE_OTHER): Payer: Medicare Other | Admitting: Podiatry

## 2013-10-20 DIAGNOSIS — B351 Tinea unguium: Secondary | ICD-10-CM

## 2013-10-20 DIAGNOSIS — M79676 Pain in unspecified toe(s): Secondary | ICD-10-CM

## 2013-10-20 DIAGNOSIS — M79609 Pain in unspecified limb: Secondary | ICD-10-CM

## 2013-10-21 NOTE — Progress Notes (Signed)
Patient ID: [name], female   DOB: [birth year], 80 y.o.   MRN: [id number]  Subjective: This patient presents again complaining of painful toenails on the right and left feet  Objective: Incurvated, hypertrophic, discolored toenails 6-10  Assessment: Symptomatic onychomycoses 6-10  Plan: Debridement toenails x10 without a bleeding  Reappoint x3 months

## 2014-01-19 ENCOUNTER — Ambulatory Visit: Payer: Medicare Other | Admitting: Podiatry

## 2014-02-09 ENCOUNTER — Ambulatory Visit: Payer: Medicare Other | Admitting: Podiatry

## 2014-02-23 ENCOUNTER — Ambulatory Visit (INDEPENDENT_AMBULATORY_CARE_PROVIDER_SITE_OTHER): Payer: Medicare Other | Admitting: Podiatry

## 2014-02-23 ENCOUNTER — Encounter: Payer: Self-pay | Admitting: Podiatry

## 2014-02-23 DIAGNOSIS — B351 Tinea unguium: Secondary | ICD-10-CM

## 2014-02-23 DIAGNOSIS — M79676 Pain in unspecified toe(s): Secondary | ICD-10-CM | POA: Diagnosis not present

## 2014-02-23 NOTE — Progress Notes (Signed)
Patient ID: [name], female   DOB: July 17, 1918, 81 y.o.   MRN: [id number] Subjective: This patient presents again complaining of toenails right and left feet  Objective: Orientated 3 The toenails are elongated, hypertrophic, discolored and tender to palpation 6-10  Assessment: Symptomatic onychomycoses 6-10  Plan: Debridement toenails 10 without a bleeding  Reappoint at three-month intervals

## 2014-05-25 ENCOUNTER — Ambulatory Visit: Payer: Medicare Other | Admitting: Podiatry

## 2014-06-01 ENCOUNTER — Ambulatory Visit (INDEPENDENT_AMBULATORY_CARE_PROVIDER_SITE_OTHER): Payer: Medicare Other | Admitting: Internal Medicine

## 2014-06-01 ENCOUNTER — Encounter: Payer: Self-pay | Admitting: Internal Medicine

## 2014-06-01 VITALS — BP 107/72 | HR 80 | Ht 62.0 in | Wt 113.2 lb

## 2014-06-01 DIAGNOSIS — I495 Sick sinus syndrome: Secondary | ICD-10-CM

## 2014-06-01 DIAGNOSIS — I480 Paroxysmal atrial fibrillation: Secondary | ICD-10-CM

## 2014-06-01 DIAGNOSIS — Z45018 Encounter for adjustment and management of other part of cardiac pacemaker: Secondary | ICD-10-CM

## 2014-06-01 NOTE — Patient Instructions (Signed)
Medication Instructions:  Your physician recommends that you continue on your current medications as directed. Please refer to the Current Medication list given to you today.  Labwork: None ordered  Testing/Procedures: None ordered  Follow-Up: Remote monitoring is used to monitor your Pacemaker of ICD from home. This monitoring reduces the number of office visits required to check your device to one time per year. It allows us to keep an eye on the functioning of your device to ensure it is working properly. You are scheduled for a device check from home on 08/31/14. You may send your transmission at any time that day. If you have a wireless device, the transmission will be sent automatically. After your physician reviews your transmission, you will receive a postcard with your next transmission date.  Your physician wants you to follow-up in: 1 year with Dr. Klein.  You will receive a reminder letter in the mail two months in advance. If you don't receive a letter, please call our office to schedule the follow-up appointment.   Thank you for choosing [facility]!!          

## 2014-06-01 NOTE — Progress Notes (Signed)
ELECTROPHYSIOLOGY CONSULT NOTE  Patient ID: [name], MRN: [id number], DOB/AGE: Jun 13, 1918 81 y.o. Admit date: (Not on file) Date of Consult: 06/01/2014  Primary Physician: Rinaldo CloudHarwani, Mohan, MD [facility]  Chief Complaint: establish   HPI [name] is a 81 y.o. female  Seen to establish pacemaker follow-up. Her device was implanted August 2010 for tachybradycardia syndrome in [facility].  She has a history of atrial fibrillation which has been paroxysmal. Treatment with anticoagulation has been complicated by GI bleeding probably related at least in large part to hemorrhoids. Coumadin was discontinued managed currently with aspirin.  Echocardiogram 4/15 demonstrated near-normal LV function. She has moderate aortic stenosis with a mean gradient of 21. She has few symptoms of exercise intolerance. She has some peripheral edema.  She struggled with her memory.       Past Medical History  Diagnosis Date  . Pacemaker   . A-fib   . Hypertension   . Hypothyroidism   . Hyperlipemia   . CHF (congestive heart failure)   . Osteoarthritis   . Osteoporosis   . Aortic stenosis, severe   . Sick sinus syndrome   . GI bleed   . Hip fracture   . Tachypnea       Surgical History:  Past Surgical History  Procedure Laterality Date  . Humerus fracture surgery    . Hip arthroplasty  08/11/2011    Procedure: ARTHROPLASTY BIPOLAR HIP;  Surgeon: Shelda PalMatthew D Olin, MD;  [facility];  Service: Orthopedics;  Laterality: Left;     Home Meds: Prior to Admission medications   Medication Sig Start Date End Date Taking? Authorizing Provider  acetaminophen (TYLENOL) 650 MG CR tablet Take 650 mg by mouth at bedtime. Take 2 tablets by mouth at bedtime daily   Yes Historical Provider, MD  amiodarone (PACERONE) 200 MG tablet Take 200 mg by mouth daily.   Yes Historical Provider, MD  aspirin 81 MG tablet Take 81 mg by mouth daily.   Yes Historical  Provider, MD  digoxin (LANOXIN) 0.125 MG tablet Take 0.0625 mg by mouth daily.   Yes Historical Provider, MD  Docusate Sodium (COLACE PO) Take 1 tablet by mouth daily.   Yes Historical Provider, MD  levothyroxine (SYNTHROID, LEVOTHROID) 100 MCG tablet Take 100 mcg by mouth daily.   Yes Historical Provider, MD  metoprolol tartrate (LOPRESSOR) 25 MG tablet Take 25 mg by mouth daily.   Yes Historical Provider, MD  pantoprazole (PROTONIX) 40 MG tablet Take 1 tablet (40 mg total) by mouth daily. 06/29/13  Yes Orpah CobbAjay Kadakia, MD  potassium chloride SA (K-DUR,KLOR-CON) 20 MEQ tablet Take 0.5 tablets (10 mEq total) by mouth daily. 04/28/13  Yes Rinaldo CloudMohan Harwani, MD  Sennosides (SENNA LAXATIVE PO) Take 1 tablet by mouth daily.   Yes Historical Provider, MD  traZODone (DESYREL) 50 MG tablet Take 25 mg by mouth at bedtime.   Yes Historical Provider, MD      Allergies: No Known Allergies  History   Social History  . Marital Status: Single    Spouse Name: N/A  . Number of Children: N/A  . Years of Education: N/A   Occupational History  . Not on file.   Social History Main Topics  . Smoking status: Never Smoker   . Smokeless tobacco: Never Used  . Alcohol Use: 0.0 oz/week     Comment: once a week, on Sunday  . Drug Use: No  . Sexual Activity: Not on file  Other Topics Concern  . Not on file   Social History Narrative     Family History  Problem Relation Age of Onset  . Bipolar disorder Mother      ROS:  Please see the history of present illness.     All other systems reviewed and negative.    Physical Exam: Height 5\' 2"  (1.575 m), weight 113 lb 3.2 oz (51.347 kg). General: Well developed, well nourished female in no acute distress. Head: Normocephalic, atraumatic, sclera non-icteric, no xanthomas, nares are without discharge. EENT: normal Lymph Nodes:  none Back: without scoliosis/kyphosis , no CVA tendersness Neck: Negative for carotid bruits. JV8 cm. Carotids are delayed. . Lungs:  Clear bilaterally to auscultation without wheezes, rales, or rhonchi. Breathing is unlabored. Heart: RRR with S1  splitS2.  3/6 systolic murmur , rubs, or gallops appreciated. Abdomen: Soft, non-tender, non-distended with normoactive bowel sounds. No hepatomegaly. No rebound/guarding. No obvious abdominal masses. Msk:  Strength and tone appear normal for age. Extremities: No clubbing or cyanosis. Trace R>L  edema.  Distal pedal pulses are 2+ and equal bilaterally. Skin: Warm and Dry Neuro: Alert and oriented X 3. CN III-XII intact Grossly normal sensory and motor function . Psych:  Responds to questions appropriately with a normal affect.      Labs: Cardiac Enzymes No results for input(s): CKTOTAL, CKMB, TROPONINI in the last 72 hours. CBC Lab Results  Component Value Date   WBC 7.8 06/29/2013   HGB 12.0 06/29/2013   HCT 39.7 06/29/2013   MCV 84.3 06/29/2013   PLT 242 06/29/2013   PROTIME: No results for input(s): LABPROT, INR in the last 72 hours. Chemistry No results for input(s): NA, K, CL, CO2, BUN, CREATININE, CALCIUM, PROT, BILITOT, ALKPHOS, ALT, AST, GLUCOSE in the last 168 hours.  Invalid input(s): LABALBU Lipids Lab Results  Component Value Date   CHOL 125 04/23/2013   HDL 45 04/23/2013   LDLCALC 63 04/23/2013   TRIG 86 04/23/2013   BNP PRO B NATRIURETIC PEPTIDE (BNP)  Date/Time Value Ref Range Status  04/28/2013 03:55 AM 4959.0* 0 - 450 pg/mL Final  04/26/2013 03:48 AM 4369.0* 0 - 450 pg/mL Final  04/23/2013 09:49 AM 5289.0* 0 - 450 pg/mL Final  04/23/2013 06:45 AM 5785.0* 0 - 450 pg/mL Final   Thyroid Function Tests: No results for input(s): TSH, T4TOTAL, T3FREE, THYROIDAB in the last 72 hours.  Invalid input(s): FREET3    Miscellaneous No results found for: DDIMER  Radiology/Studies:  No results found.  EKG:  Atrial fibrillation with underlying ventricular pacing   Assessment and Plan:  Tachybradycardia syndrome  Atrial  fibrillation  Pacemaker-[employer] Jude  High risk medication surveillance   The patient is on high-risk medications which need surveillance I anticipate that this is being followed by Dr. Melchor AmourHarawani.  We discussed the issue of anticoagulation; it is probably the biggest thing that we can do in terms of reducing morbidity and mortality risks. We discussed the implications of AVERROES as relates to the role of apixaban

## 2014-06-03 LAB — CUP PACEART INCLINIC DEVICE CHECK
Battery Voltage: 2.89 V
Brady Statistic RA Percent Paced: 42 %
Brady Statistic RV Percent Paced: 48 %
Date Time Interrogation Session: 20160509192318
Lead Channel Impedance Value: 362.5 Ohm
Lead Channel Pacing Threshold Amplitude: 0.75 V
Lead Channel Pacing Threshold Amplitude: 1.5 V
Lead Channel Pacing Threshold Pulse Width: 0.5 ms
Lead Channel Pacing Threshold Pulse Width: 0.5 ms
Lead Channel Pacing Threshold Pulse Width: 0.7 ms
Lead Channel Sensing Intrinsic Amplitude: 4 mV
Lead Channel Sensing Intrinsic Amplitude: 6.1 mV
Lead Channel Setting Pacing Amplitude: 1.75 V
Lead Channel Setting Pacing Pulse Width: 0.5 ms
MDC IDC MSMT BATTERY REMAINING LONGEVITY: 58.8 mo
MDC IDC MSMT LEADCHNL RV IMPEDANCE VALUE: 350 Ohm
MDC IDC MSMT LEADCHNL RV PACING THRESHOLD AMPLITUDE: 1.75 V
MDC IDC PG SERIAL: 7062185
MDC IDC SET LEADCHNL RV PACING AMPLITUDE: 2 V
MDC IDC SET LEADCHNL RV SENSING SENSITIVITY: 0.5 mV
Pulse Gen Model: 2210

## 2014-06-05 ENCOUNTER — Telehealth: Payer: Self-pay | Admitting: *Deleted

## 2014-06-05 NOTE — Telephone Encounter (Signed)
LMOVM to son w/ my direct #.  Re: last name discrepancy in [employer] data base for [employer]

## 2014-06-08 ENCOUNTER — Encounter: Payer: Self-pay | Admitting: Podiatry

## 2014-06-08 ENCOUNTER — Ambulatory Visit (INDEPENDENT_AMBULATORY_CARE_PROVIDER_SITE_OTHER): Payer: Medicare Other | Admitting: Podiatry

## 2014-06-08 DIAGNOSIS — M79676 Pain in unspecified toe(s): Secondary | ICD-10-CM | POA: Diagnosis not present

## 2014-06-08 DIAGNOSIS — B351 Tinea unguium: Secondary | ICD-10-CM

## 2014-06-08 NOTE — Patient Instructions (Signed)
Apply Triple antibiotic ointment daily to the left big toe nail area and cover with a Band-Aid until a scab forms

## 2014-06-09 NOTE — Progress Notes (Signed)
Patient ID: [name], female   DOB: 03-11-1918, 81 y.o.   MRN: [id number]  Subjective: This patient presents today for his scheduled visit for debridement of painful toenails  Objective: Orientated 3 The toenails are hypertrophic elongated, discolored and tender direct palpation 6-10 The left hallux nail plate has some serous fluid and the nail plate debrided completely from the nailbed. The underlying nailbed bed has a moist, inflammatory issue without any active bleeding or drainage  Assessment: Symptomatic onychomycoses 6-10 Subungual abscess left hallux  Plan: Debridement toenails 10 without any bleeding I made patient aware of the inflammatory tissue on the left hallux and applied triple antibiotic ointment and a Band-Aid to the area. I advised patient to continue applying triple antibiotic ointment and Band-Aid to this area daily until a scab forms Reappoint 3 months or sooner if patient has concern

## 2014-06-11 ENCOUNTER — Telehealth: Payer: Self-pay | Admitting: *Deleted

## 2014-06-11 NOTE — Telephone Encounter (Signed)
Pt's son, Jonny RuizJohn states pt has misunderstood the Dr. Leeanne Deeduchman instruction for aftercare for her toenail.  I told Jonny RuizJohn his mother need to cleanse the toenail area daily and apply a antibiotic ointment daily until the area got a dry hard scab.

## 2014-06-21 ENCOUNTER — Emergency Department (HOSPITAL_COMMUNITY)
Admission: EM | Admit: 2014-06-21 | Discharge: 2014-06-21 | Disposition: A | Discharge status: Home / Self Care | Payer: Medicare Other | Attending: Emergency Medicine | Admitting: Emergency Medicine

## 2014-06-21 ENCOUNTER — Encounter (HOSPITAL_COMMUNITY): Payer: Self-pay | Admitting: Emergency Medicine

## 2014-06-21 ENCOUNTER — Emergency Department (HOSPITAL_COMMUNITY): Payer: Medicare Other

## 2014-06-21 DIAGNOSIS — W1839XA Other fall on same level, initial encounter: Secondary | ICD-10-CM | POA: Insufficient documentation

## 2014-06-21 DIAGNOSIS — I509 Heart failure, unspecified: Secondary | ICD-10-CM | POA: Diagnosis not present

## 2014-06-21 DIAGNOSIS — I10 Essential (primary) hypertension: Secondary | ICD-10-CM | POA: Diagnosis not present

## 2014-06-21 DIAGNOSIS — Y9289 Other specified places as the place of occurrence of the external cause: Secondary | ICD-10-CM | POA: Diagnosis not present

## 2014-06-21 DIAGNOSIS — M1990 Unspecified osteoarthritis, unspecified site: Secondary | ICD-10-CM | POA: Diagnosis not present

## 2014-06-21 DIAGNOSIS — E039 Hypothyroidism, unspecified: Secondary | ICD-10-CM | POA: Insufficient documentation

## 2014-06-21 DIAGNOSIS — R531 Weakness: Secondary | ICD-10-CM | POA: Diagnosis not present

## 2014-06-21 DIAGNOSIS — Y998 Other external cause status: Secondary | ICD-10-CM | POA: Diagnosis not present

## 2014-06-21 DIAGNOSIS — S42031A Displaced fracture of lateral end of right clavicle, initial encounter for closed fracture: Secondary | ICD-10-CM | POA: Insufficient documentation

## 2014-06-21 DIAGNOSIS — M25519 Pain in unspecified shoulder: Secondary | ICD-10-CM

## 2014-06-21 DIAGNOSIS — I4891 Unspecified atrial fibrillation: Secondary | ICD-10-CM | POA: Diagnosis not present

## 2014-06-21 DIAGNOSIS — S4991XA Unspecified injury of right shoulder and upper arm, initial encounter: Secondary | ICD-10-CM | POA: Diagnosis present

## 2014-06-21 DIAGNOSIS — Z8719 Personal history of other diseases of the digestive system: Secondary | ICD-10-CM | POA: Diagnosis not present

## 2014-06-21 DIAGNOSIS — Y93G3 Activity, cooking and baking: Secondary | ICD-10-CM | POA: Diagnosis not present

## 2014-06-21 DIAGNOSIS — S42001A Fracture of unspecified part of right clavicle, initial encounter for closed fracture: Secondary | ICD-10-CM

## 2014-06-21 DIAGNOSIS — Z950 Presence of cardiac pacemaker: Secondary | ICD-10-CM | POA: Diagnosis not present

## 2014-06-21 LAB — URINE MICROSCOPIC-ADD ON

## 2014-06-21 LAB — CBC
HCT: 46.1 % — ABNORMAL HIGH (ref 36.0–46.0)
Hemoglobin: 15.1 g/dL — ABNORMAL HIGH (ref 12.0–15.0)
MCH: 32.5 pg (ref 26.0–34.0)
MCHC: 32.8 g/dL (ref 30.0–36.0)
MCV: 99.1 fL (ref 78.0–100.0)
Platelets: 285 10*3/uL (ref 150–400)
RBC: 4.65 MIL/uL (ref 3.87–5.11)
RDW: 14.4 % (ref 11.5–15.5)
WBC: 12.5 10*3/uL — ABNORMAL HIGH (ref 4.0–10.5)

## 2014-06-21 LAB — BASIC METABOLIC PANEL
Anion gap: 11 (ref 5–15)
BUN: 29 mg/dL — AB (ref 6–20)
CHLORIDE: 101 mmol/L (ref 101–111)
CO2: 27 mmol/L (ref 22–32)
Calcium: 9.6 mg/dL (ref 8.9–10.3)
Creatinine, Ser: 1.14 mg/dL — ABNORMAL HIGH (ref 0.44–1.00)
GFR calc Af Amer: 47 mL/min — ABNORMAL LOW (ref >60)
GFR, EST NON AFRICAN AMERICAN: 40 mL/min — AB (ref >60)
Glucose, Bld: 97 mg/dL (ref 65–99)
Potassium: 4.1 mmol/L (ref 3.5–5.1)
SODIUM: 139 mmol/L (ref 135–145)

## 2014-06-21 LAB — URINALYSIS, ROUTINE W REFLEX MICROSCOPIC
Bilirubin Urine: NEGATIVE
GLUCOSE, UA: NEGATIVE mg/dL
HGB URINE DIPSTICK: NEGATIVE
Ketones, ur: NEGATIVE mg/dL
NITRITE: NEGATIVE
PH: 5 (ref 5.0–8.0)
PROTEIN: NEGATIVE mg/dL
SPECIFIC GRAVITY, URINE: 1.031 — AB (ref 1.005–1.030)
UROBILINOGEN UA: 0.2 mg/dL (ref 0.0–1.0)

## 2014-06-21 LAB — I-STAT TROPONIN, ED: TROPONIN I, POC: 0.02 ng/mL (ref 0.00–0.08)

## 2014-06-21 NOTE — ED Provider Notes (Signed)
Medical screening examination/treatment/procedure(s) were conducted as a shared visit with non-physician practitioner(s) and myself.  I personally evaluated the patient during the encounter.   EKG Interpretation   Date/Time:  Sunday Jun 21 2014 16:56:10 EDT Ventricular Rate:  83 PR Interval:    QRS Duration: 172 QT Interval:  434 QTC Calculation: 509 R Axis:   -83 Text Interpretation:  Ventricular-paced rhythm with occasional and  consecutive Premature ventricular complexes Abnormal ECG new since prior  Confirmed by Breiana Stratmann  MD, Haleem Hanner ([id number]) on 06/21/2014 6:45:33 PM     Patient here complaining of right shoulder pain after she slid to the ground. Pain in her right shoulder is worse with movement. On exam or deformities noted. Range of motion limited by pain. Will obtain x-rays and disposition  Lorre NickAnthony Thressa Shiffer, MD 06/21/14 [phone]

## 2014-06-21 NOTE — Discharge Instructions (Signed)
Acromioclavicular Injuries °The AC (acromioclavicular) joint is the joint in the shoulder where the collarbone (clavicle) meets the shoulder blade (scapula). The part of the shoulder blade connected to the collarbone is called the acromion. Common problems with and treatments for the AC joint are detailed below. °ARTHRITIS °Arthritis occurs when the joint has been injured and the smooth padding between the joints (cartilage) is lost. This is the wear and tear seen in most joints of the body if they have been overused. This causes the joint to produce pain and swelling which is worse with activity.  °AC JOINT SEPARATION °AC joint separation means that the ligaments connecting the acromion of the shoulder blade and collarbone have been damaged, and the two bones no longer line up. AC separations can be anywhere from mild to severe, and are "graded" depending upon which ligaments are torn and how badly they are torn. °· Grade I Injury: the least damage is done, and the AC joint still lines up. °· Grade II Injury: damage to the ligaments which reinforce the AC joint. In a Grade II injury, these ligaments are stretched but not entirely torn. When stressed, the AC joint becomes painful and unstable. °· Grade III Injury: AC and secondary ligaments are completely torn, and the collarbone is no longer attached to the shoulder blade. This results in deformity; a prominence of the end of the clavicle. °AC JOINT FRACTURE °AC joint fracture means that there has been a break in the bones of the AC joint, usually the end of the clavicle. °TREATMENT °TREATMENT OF AC ARTHRITIS °· There is currently no way to replace the cartilage damaged by arthritis. The best way to improve the condition is to decrease the activities which aggravate the problem. Application of ice to the joint helps decrease pain and soreness (inflammation). The use of non-steroidal anti-inflammatory medication is helpful. °· If less conservative measures do not  work, then cortisone shots (injections) may be used. These are anti-inflammatories; they decrease the soreness in the joint and swelling. °· If non-surgical measures fail, surgery may be recommended. The procedure is generally removal of a portion of the end of the clavicle. This is the part of the collarbone closest to your acromion which is stabilized with ligaments to the acromion of the shoulder blade. This surgery may be performed using a tube-like instrument with a light (arthroscope) for looking into a joint. It may also be performed as an open surgery through a small incision by the surgeon. Most patients will have good range of motion within 6 weeks and may return to all activity including sports by 8-12 weeks, barring complications. °TREATMENT OF AN AC SEPARATION °· The initial treatment is to decrease pain. This is best accomplished by immobilizing the arm in a sling and placing an ice pack to the shoulder for 20 to 30 minutes every 2 hours as needed. As the pain starts to subside, it is important to begin moving the fingers, wrist, elbow and eventually the shoulder in order to prevent a stiff or "frozen" shoulder. Instruction on when and how much to move the shoulder will be provided by your caregiver. The length of time needed to regain full motion and function depends on the amount or grade of the injury. Recovery from a Grade I AC separation usually takes 10 to 14 days, whereas a Grade III may take 6 to 8 weeks. °· Grade I and II separations usually do not require surgery. Even Grade III injuries usually allow return to full   activity with few restrictions. Treatment is also based on the activity demands of the injured shoulder. For example, a high level quarterback with an injured throwing arm will receive more aggressive treatment than someone with a desk job who rarely uses his/her arm for strenuous activities. In some cases, a painful lump may persist which could require a later surgery. Surgery  can be very successful, but the benefits must be weighed against the potential risks. °TREATMENT OF AN AC JOINT FRACTURE °Fracture treatment depends on the type of fracture. Sometimes a splint or sling may be all that is required. Other times surgery may be required for repair. This is more frequently the case when the ligaments supporting the clavicle are completely torn. Your caregiver will help you with these decisions and together you can decide what will be the best treatment. °HOME CARE INSTRUCTIONS  °· Apply ice to the injury for 15-20 minutes each hour while awake for 2 days. Put the ice in a plastic bag and place a towel between the bag of ice and skin. °· If a sling has been applied, wear it constantly for as long as directed by your caregiver, even at night. The sling or splint can be removed for bathing or showering or as directed. Be sure to keep the shoulder in the same place as when the sling is on. Do not lift the arm. °· If a figure-of-eight splint has been applied it should be tightened gently by another person every day. Tighten it enough to keep the shoulders held back. Allow enough room to place the index finger between the body and strap. Loosen the splint immediately if there is numbness or tingling in the hands. °· Take over-the-counter or prescription medicines for pain, discomfort or fever as directed by your caregiver. °· If you or your child has received a follow up appointment, it is very important to keep that appointment in order to avoid long term complications, chronic pain or disability. °SEEK MEDICAL CARE IF:  °· The pain is not relieved with medications. °· There is increased swelling or discoloration that continues to get worse rather than better. °· You or your child has been unable to follow up as instructed. °· There is progressive numbness and tingling in the arm, forearm or hand. °SEEK IMMEDIATE MEDICAL CARE IF:  °· The arm is numb, cold or pale. °· There is increasing pain  in the hand, forearm or fingers. °MAKE SURE YOU:  °· Understand these instructions. °· Will watch your condition. °· Will get help right away if you are not doing well or get worse. °Document Released: 10/19/2004 Document Revised: 04/03/2011 Document Reviewed: 04/13/2008 °[employer]® Patient Information ©2015 [employer]. This information is not intended to replace advice given to you by your health care provider. Make sure you discuss any questions you have with your health care provider. ° °

## 2014-06-21 NOTE — ED Notes (Addendum)
C/o R shoulder pain and R upper arm pain that started at 4am while cooking.  States she had  a syncopal episode and fell down just after pain started in R shoulder.  Denies injury from fall. Reports R shoulder pain started prior to fall.   Denies LOC.  States, "my body just gave out."  Denies dizziness prior to fall.  No neuro deficits noted on triage exam- but unable to lift R shoulder/ upper arm due to pain.

## 2014-06-21 NOTE — ED Provider Notes (Signed)
CSN: [id number]     Arrival date & time 06/21/14  1638 History   First MD Initiated Contact with Patient 06/21/14 1714     Chief Complaint  Patient presents with  . Shoulder Pain  . Arm Pain     (Consider location/radiation/quality/duration/timing/severity/associated sxs/prior Treatment) The history is provided by the patient and a relative. No language interpreter was used.   [name] is a 81 year old female with a history of hypertension, hyperlipidemia, hypothyroidism, A. fib, pacemaker for tachybradycardia syndrome, CHF, GI bleed, hip fracture and replacement who presents for right shoulder pain that occurred abruptly this morning at 4 AM. She states the pain was so intense that she had to sit down on the floor. She states she felt weak and could not get up. Her son had to pick her up and put her back into the bed. She stated she slept for a short period but still continues to have right shoulder pain. She denies any fall or injury to the right shoulder. She denies any syncope, dizziness, headache, cough, recent illness, chest pain, shortness of breath, abdominal pain, dysuria, increased leg swelling. Past Medical History  Diagnosis Date  . Pacemaker   . A-fib   . Hypertension   . Hypothyroidism   . Hyperlipemia   . CHF (congestive heart failure)   . Osteoarthritis   . Osteoporosis   . Aortic stenosis, severe   . Sick sinus syndrome   . GI bleed   . Hip fracture   . Tachypnea    Past Surgical History  Procedure Laterality Date  . Humerus fracture surgery    . Hip arthroplasty  08/11/2011    Procedure: ARTHROPLASTY BIPOLAR HIP;  Surgeon: Shelda PalMatthew D Olin, MD;  [facility];  Service: Orthopedics;  Laterality: Left;   Family History  Problem Relation Age of Onset  . Bipolar disorder Mother    History  Substance Use Topics  . Smoking status: Never Smoker   . Smokeless tobacco: Never Used  . Alcohol Use: 0.0 oz/week     Comment: once a week, on Sunday   OB History     No data available     Review of Systems  Constitutional: Negative for fever.  Gastrointestinal: Negative for nausea, vomiting, abdominal pain and diarrhea.  Musculoskeletal: Positive for myalgias and arthralgias. Negative for neck stiffness.  Skin: Negative for pallor, rash and wound.  Neurological: Negative for dizziness, syncope, facial asymmetry, speech difficulty, weakness, numbness and headaches.  All other systems reviewed and are negative.     Allergies  Review of patient's allergies indicates no known allergies.  Home Medications   Prior to Admission medications   Medication Sig Start Date End Date Taking? Authorizing Provider  acetaminophen (TYLENOL) 650 MG CR tablet Take 650 mg by mouth at bedtime. Take 2 tablets by mouth at bedtime daily   Yes Historical Provider, MD  amiodarone (PACERONE) 200 MG tablet Take 200 mg by mouth daily.   Yes Historical Provider, MD  aspirin 81 MG tablet Take 81 mg by mouth daily.   Yes Historical Provider, MD  denosumab (PROLIA) 60 MG/ML SOLN injection Inject 60 mg into the skin every 6 (six) months. Administer in upper arm, thigh, or abdomen   Yes Historical Provider, MD  digoxin (LANOXIN) 0.125 MG tablet Take 0.0625 mg by mouth daily.   Yes Historical Provider, MD  Docusate Sodium (COLACE PO) Take 1 tablet by mouth daily.   Yes Historical Provider, MD  metoprolol succinate (TOPROL-XL) 25 MG 24  hr tablet Take 25 mg by mouth daily.   Yes Historical Provider, MD  potassium chloride SA (K-DUR,KLOR-CON) 20 MEQ tablet Take 0.5 tablets (10 mEq total) by mouth daily. 04/28/13  Yes Rinaldo Cloud, MD  Sennosides (SENNA LAXATIVE PO) Take 1 tablet by mouth daily.   Yes Historical Provider, MD  traZODone (DESYREL) 50 MG tablet Take 25 mg by mouth at bedtime.   Yes Historical Provider, MD  levothyroxine (SYNTHROID, LEVOTHROID) 100 MCG tablet Take 100 mcg by mouth daily. Takes 1.5 tabs on sundays only Takes 1 tab all other days    Historical Provider, MD   pantoprazole (PROTONIX) 40 MG tablet Take 1 tablet (40 mg total) by mouth daily. 06/29/13   Orpah Cobb, MD   BP 141/98 mmHg  Pulse 61  Temp(Src) 98.1 F (36.7 C) (Oral)  Resp 21  Ht  (1.626 m)  Wt 112 lb (50.803 kg)  BMI 19.22 kg/m2  SpO2 99% Physical Exam  Constitutional: She is oriented to person, place, and time. She appears well-developed and well-nourished.  HENT:  Head: Normocephalic and atraumatic.  No contusion, ecchymosis, or signs of injury.  Eyes: Conjunctivae are normal.  Neck: Normal range of motion. Neck supple.  Cardiovascular: Normal rate and regular rhythm.   Aortic stenosis.  Pulmonary/Chest: Effort normal and breath sounds normal.  No deformity of the chest wall.  Abdominal: Soft. There is no tenderness.  Musculoskeletal: Normal range of motion.  Right arm: She has tenderness to palpation of the right shoulder. Patient is able to abduct arm to 45 without difficulty. She has a good radial pulse. Able to extend and flex at the elbow. Able to flex and extend her fingers and hand. She has no swelling, effusion, deformity, ecchymosis to the right shoulder. No deformity of the clavicle.  Neurological: She is alert and oriented to person, place, and time. No sensory deficit. GCS eye subscore is 4. GCS verbal subscore is 5. GCS motor subscore is 6.  Cranial nerves III-X and XII are intact.    Skin: Skin is warm and dry.  Nursing note and vitals reviewed.   ED Course  Procedures (including critical care time) Labs Review Labs Reviewed  CBC - Abnormal; Notable for the following:    WBC 12.5 (*)    Hemoglobin 15.1 (*)    HCT 46.1 (*)    All other components within normal limits  BASIC METABOLIC PANEL - Abnormal; Notable for the following:    BUN 29 (*)    Creatinine, Ser 1.14 (*)    GFR calc non Af Amer 40 (*)    GFR calc Af Amer 47 (*)    All other components within normal limits  URINALYSIS, ROUTINE W REFLEX MICROSCOPIC (NOT AT [facility]) - Abnormal; Notable  for the following:    Color, Urine AMBER (*)    Specific Gravity, Urine 1.031 (*)    Leukocytes, UA SMALL (*)    All other components within normal limits  URINE MICROSCOPIC-ADD ON - Abnormal; Notable for the following:    Bacteria, UA MANY (*)    All other components within normal limits  URINE CULTURE  I-STAT TROPOININ, ED    Imaging Review Dg Chest 1 View  06/21/2014   CLINICAL DATA:  Status post fall while walking down hallway. Right shoulder and neck pain. Initial encounter.  EXAM: CHEST  1 VIEW  COMPARISON:  Chest radiograph performed 04/26/2013  FINDINGS: There is a comminuted fracture involving the distal aspect of the right clavicle, likely  extending to the acromioclavicular joint. No additional fractures are seen.  Mild peribronchial thickening is noted. Mild bilateral atelectasis is noted. No pleural effusion or pneumothorax.  The cardiomediastinal silhouette is mildly enlarged. A pacemaker is noted overlying the left chest wall, with leads ending overlying the right atrium and right ventricle. No acute osseous abnormalities are identified.  IMPRESSION: 1. Comminuted fracture involving the distal aspect of the right clavicle, likely extending to the acromioclavicular joint. 2. Mild peribronchial thickening noted. Mild bilateral atelectasis seen. Mild cardiomegaly.   Electronically Signed   By: Roanna Raider M.D.   On: 06/21/2014 19:24   Dg Shoulder Right  06/21/2014   CLINICAL DATA:  Status post fall at 4:00 a.m. 06/21/2014. Right shoulder pain. Initial encounter.  EXAM: RIGHT SHOULDER - 2+ VIEW  COMPARISON:  None.  FINDINGS: The patient has a fracture of the distal diaphysis of the right clavicle with slight inferior displacement and mild comminution. The acromioclavicular joint remains intact. The humeral head is located. Except as noted, no fracture is identified.  IMPRESSION: Minimally displaced fracture of the distal right clavicle. The acromioclavicular joint is intact.    Electronically Signed   By: Drusilla Kanner M.D.   On: 06/21/2014 19:21     EKG Interpretation   Date/Time:  Sunday Jun 21 2014 16:56:10 EDT Ventricular Rate:  83 PR Interval:    QRS Duration: 172 QT Interval:  434 QTC Calculation: 509 R Axis:   -83 Text Interpretation:  Ventricular-paced rhythm with occasional and  consecutive Premature ventricular complexes Abnormal ECG new since prior  Confirmed by ALLEN  MD, ANTHONY ([id number]) on 06/21/2014 6:45:33 PM      MDM   Final diagnoses:  Clavicle fracture, right, closed, initial encounter  Weakness   Presents for weakness and right shoulder pain since 4 AM this morning. Her story is inconsistent with her injuries. Her son states that the brother came to visit her with his wife last night. He states he heard a loud thud in the middle of the night and found his mother sitting on the floor in the kitchen. She states she could not get up but denies any fall or loss of consciousness. She states she may have hit her shoulder on the entryway to the kitchen. Her vitals are stable. She is nontoxic appearing. In no acute distress. She is able to ambulate in the ED. The x-ray of her shoulder shows a minimally displaced fracture of the distal right clavicle with the acromioclavicular joint intact. Her EKG is not concerning. Her troponin is negative. I put her in a right arm sling. She can take Tylenol for pain. She can follow up with her PCP and her and her son agree with the plan. I discussed this patient with Dr. Freida Busman who is seen and evaluated the patient also.   Catha Gosselin, PA-C 06/22/14 [phone]

## 2014-06-24 LAB — URINE CULTURE: Colony Count: >=100000

## 2014-06-25 NOTE — Progress Notes (Signed)
ED Antimicrobial Stewardship Positive Culture Follow Up   Collier Genelle BalJ Fils is an 81 y.o. female who [facility] with a chief complaint of  Chief Complaint  Patient presents with  . Shoulder Pain  . Arm Pain    Recent Results (from the past 720 hour(s))  Urine culture     Status: None   Collection Time: 06/21/14  9:29 PM  Result Value Ref Range Status   Specimen Description URINE, CATHETERIZED  Final   Special Requests NONE  Final   Colony Count   Final    >=100,000 COLONIES/ML Performed at [facility]    Culture   Final    ESCHERICHIA COLI Note: Two isolates with different morphologies were identified as the same organism.The most resistant organism was reported. Performed at [facility]    Report Status 06/24/2014 FINAL  Final   Organism ID, Bacteria ESCHERICHIA COLI  Final      Susceptibility   Escherichia coli - MIC*    AMPICILLIN <=2 SENSITIVE Sensitive     CEFAZOLIN <=4 SENSITIVE Sensitive     CEFTRIAXONE <=1 SENSITIVE Sensitive     CIPROFLOXACIN <=0.25 SENSITIVE Sensitive     GENTAMICIN <=1 SENSITIVE Sensitive     LEVOFLOXACIN <=0.12 SENSITIVE Sensitive     NITROFURANTOIN <=16 SENSITIVE Sensitive     TOBRAMYCIN <=1 SENSITIVE Sensitive     TRIMETH/SULFA <=20 SENSITIVE Sensitive     PIP/TAZO <=4 SENSITIVE Sensitive     * ESCHERICHIA COLI    [x]  Patient discharged originally without antimicrobial agent and treatment is now indicated  New antibiotic prescription: Amoxicillin 250 mg bid for 7 days  ED Provider: Elson AreasLeslie K Sofia, PA-C   Rolley SimsMartin, Seydina Holliman Ann 06/25/2014, 11:18 AM Infectious Diseases Pharmacist Phone# [phone]

## 2014-06-28 ENCOUNTER — Telehealth: Payer: Self-pay | Admitting: Emergency Medicine

## 2014-06-29 ENCOUNTER — Telehealth (HOSPITAL_BASED_OUTPATIENT_CLINIC_OR_DEPARTMENT_OTHER): Payer: Self-pay | Admitting: Emergency Medicine

## 2014-07-27 ENCOUNTER — Telehealth (HOSPITAL_COMMUNITY): Payer: Self-pay

## 2014-07-27 NOTE — ED Notes (Signed)
Unable to contact pt by mail or telephone. Unable to communicate lab results or treatment changes. 

## 2014-08-31 ENCOUNTER — Ambulatory Visit (INDEPENDENT_AMBULATORY_CARE_PROVIDER_SITE_OTHER): Payer: Medicare Other | Admitting: *Deleted

## 2014-08-31 DIAGNOSIS — I495 Sick sinus syndrome: Secondary | ICD-10-CM

## 2014-08-31 NOTE — Progress Notes (Signed)
Remote pacemaker transmission.   

## 2014-09-08 LAB — CUP PACEART REMOTE DEVICE CHECK
Battery Remaining Longevity: 43 mo
Battery Remaining Percentage: 57 %
Battery Voltage: 2.89 V
Brady Statistic AP VS Percent: 7.6 %
Brady Statistic AS VS Percent: 2 %
Brady Statistic RV Percent Paced: 89 %
Lead Channel Impedance Value: 360 Ohm
Lead Channel Pacing Threshold Amplitude: 0.75 V
Lead Channel Pacing Threshold Amplitude: 2.625 V
Lead Channel Pacing Threshold Pulse Width: 0.5 ms
Lead Channel Sensing Intrinsic Amplitude: 5 mV
Lead Channel Setting Pacing Amplitude: 2.875
Lead Channel Setting Pacing Pulse Width: 0.5 ms
MDC IDC MSMT LEADCHNL RA PACING THRESHOLD PULSEWIDTH: 0.5 ms
MDC IDC MSMT LEADCHNL RV IMPEDANCE VALUE: 360 Ohm
MDC IDC MSMT LEADCHNL RV SENSING INTR AMPL: 5.4 mV
MDC IDC SESS DTM: 20160808112127
MDC IDC SET LEADCHNL RA PACING AMPLITUDE: 1.75 V
MDC IDC SET LEADCHNL RV SENSING SENSITIVITY: 0.5 mV
MDC IDC STAT BRADY AP VP PERCENT: 87 %
MDC IDC STAT BRADY AS VP PERCENT: 3.8 %
MDC IDC STAT BRADY RA PERCENT PACED: 38 %
Pulse Gen Model: 2210
Pulse Gen Serial Number: 7062185

## 2014-09-09 ENCOUNTER — Ambulatory Visit (INDEPENDENT_AMBULATORY_CARE_PROVIDER_SITE_OTHER): Payer: Medicare Other | Admitting: Endocrinology

## 2014-09-09 VITALS — BP 118/72 | HR 80 | Temp 98.0°F | Resp 14 | Ht 62.0 in | Wt 109.0 lb

## 2014-09-09 DIAGNOSIS — E559 Vitamin D deficiency, unspecified: Secondary | ICD-10-CM

## 2014-09-09 DIAGNOSIS — M8580 Other specified disorders of bone density and structure, unspecified site: Secondary | ICD-10-CM | POA: Diagnosis not present

## 2014-09-09 DIAGNOSIS — E038 Other specified hypothyroidism: Secondary | ICD-10-CM | POA: Diagnosis not present

## 2014-09-09 DIAGNOSIS — E063 Autoimmune thyroiditis: Secondary | ICD-10-CM

## 2014-09-09 LAB — CBC WITH DIFFERENTIAL/PLATELET
BASOS ABS: 0 10*3/uL (ref 0.0–0.1)
BASOS PCT: 0.2 % (ref 0.0–3.0)
EOS ABS: 0.1 10*3/uL (ref 0.0–0.7)
Eosinophils Relative: 0.7 % (ref 0.0–5.0)
HEMATOCRIT: 43.4 % (ref 36.0–46.0)
HEMOGLOBIN: 14.3 g/dL (ref 12.0–15.0)
LYMPHS PCT: 20.5 % (ref 12.0–46.0)
Lymphs Abs: 1.9 10*3/uL (ref 0.7–4.0)
MCHC: 32.9 g/dL (ref 30.0–36.0)
MCV: 98.4 fl (ref 78.0–100.0)
MONOS PCT: 6.9 % (ref 3.0–12.0)
Monocytes Absolute: 0.6 10*3/uL (ref 0.1–1.0)
Neutro Abs: 6.8 10*3/uL (ref 1.4–7.7)
Neutrophils Relative %: 71.7 % (ref 43.0–77.0)
Platelets: 235 10*3/uL (ref 150.0–400.0)
RBC: 4.41 Mil/uL (ref 3.87–5.11)
RDW: 13.5 % (ref 11.5–15.5)
WBC: 9.5 10*3/uL (ref 4.0–10.5)

## 2014-09-09 LAB — COMPREHENSIVE METABOLIC PANEL
ALT: 23 U/L (ref 0–35)
AST: 23 U/L (ref 0–37)
Albumin: 4.2 g/dL (ref 3.5–5.2)
Alkaline Phosphatase: 62 U/L (ref 39–117)
BUN: 36 mg/dL — ABNORMAL HIGH (ref 6–23)
CHLORIDE: 104 meq/L (ref 96–112)
CO2: 31 meq/L (ref 19–32)
Calcium: 9.5 mg/dL (ref 8.4–10.5)
Creatinine, Ser: 1.07 mg/dL (ref 0.40–1.20)
GFR: 50.77 mL/min — ABNORMAL LOW (ref >60.00)
GLUCOSE: 77 mg/dL (ref 70–99)
POTASSIUM: 4.6 meq/L (ref 3.5–5.1)
Sodium: 141 mEq/L (ref 135–145)
Total Bilirubin: 0.3 mg/dL (ref 0.2–1.2)
Total Protein: 6.7 g/dL (ref 6.0–8.3)

## 2014-09-09 LAB — VITAMIN D 25 HYDROXY (VIT D DEFICIENCY, FRACTURES): VITD: 78.16 ng/mL (ref 30.00–100.00)

## 2014-09-09 LAB — T4, FREE: FREE T4: 1.56 ng/dL (ref 0.60–1.60)

## 2014-09-09 LAB — TSH: TSH: 1.65 u[IU]/mL (ref 0.35–4.50)

## 2014-09-09 NOTE — Progress Notes (Signed)
Patient ID: [name], female   DOB: Jun 22, 1918, 81 y.o.   MRN: [id number]           Chief complaint: Osteoporosis and low thyroid  History of Present Illness:  PROBLEM 1: The patient is referred here for osteoporosis management. She had a screening bone density done and this showed the following T-scores: Femoral neck: In 11/2009 was -2.3 and total left hip was -1.7 Spine: Non-measurable Forearm measured in 2014 showed T score -1.6 FRAX fracture risk at the hip is not available  She has a long history of low trauma fracture and at least a 3 inch height loss  Her history is obtained mostly from her son, has some records from the [facility] but these have very little information about her osteoporosis Hip fractures in 1990, 2013; femur, traumatic ?  2011; clavicle 05/2014, she had 3 compression vertebral fractures in 2012 secondary to a fall Age at menopause: About 47  Previous treatment: Prolia, possibly got 2 injections in 2014 and may have had one in 2015, records not available Calcium supplements: Calmag Vitamin D supplements: D3, 5000 u daily for at least 6 months, no recent vitamin D level available She has been on Prolia as above; tried weekly Fosamax but could not keep up with the instructions on doing this  Since she was previously getting Prolia at the [facility] and is not going there any further she is referred here for further management by her cardiologist  PROBLEM 2: HYPOTHYROIDISM  Patient is not a good historian but she thinks she was told to have hypothyroidism 30-40 years ago, this was not postpartum She thinks she was having symptoms of fatigue before she was started on thyroid hormones and she felt better with thyroid supplementation  She has had her medication adjusted at the [facility] In 2015 she was told to take an extra half tablet of her Synthroid 100 g daily and follow-up TSH in October was normal She has not had any further  levels done  Lab Results  Component Value Date   TSH 1.65 09/09/2014   TSH 5.630* 04/23/2013   TSH 2.378 08/16/2011   FREET4 1.56 09/09/2014   FREET4 1.79 04/23/2013      Past Medical History  Diagnosis Date  . Pacemaker   . A-fib   . Hypertension   . Hypothyroidism   . Hyperlipemia   . CHF (congestive heart failure)   . Osteoarthritis   . Osteoporosis   . Aortic stenosis, severe   . Sick sinus syndrome   . GI bleed   . Hip fracture   . Tachypnea     Past Surgical History  Procedure Laterality Date  . Humerus fracture surgery    . Hip arthroplasty  08/11/2011    Procedure: ARTHROPLASTY BIPOLAR HIP;  Surgeon: Shelda Pal, MD;  Location: [facility] OR;  Service: Orthopedics;  Laterality: Left;    Family History  Problem Relation Age of Onset  . Bipolar disorder Mother     Social History:  reports that she has never smoked. She has never used smokeless tobacco. She reports that she drinks alcohol. She reports that she does not use illicit drugs.  Allergies: No Known Allergies    Medication List       This list is accurate as of: 09/09/14  3:06 PM.  Always use your most recent med list.               acetaminophen 650 MG CR tablet  Commonly known as:  TYLENOL  Take 650 mg by mouth at bedtime. Take 2 tablets by mouth at bedtime daily     amiodarone 200 MG tablet  Commonly known as:  PACERONE  Take 200 mg by mouth daily.     aspirin 81 MG tablet  Take 81 mg by mouth daily.     COLACE PO  Take 1 tablet by mouth daily.     denosumab 60 MG/ML Soln injection  Commonly known as:  PROLIA  Inject 60 mg into the skin every 6 (six) months. Administer in upper arm, thigh, or abdomen     digoxin 0.125 MG tablet  Commonly known as:  LANOXIN  Take 0.0625 mg by mouth daily.     levothyroxine 100 MCG tablet  Commonly known as:  SYNTHROID, LEVOTHROID  Take 100 mcg by mouth daily. Takes 1.5 tabs on sundays only Takes 1 tab all other days     metoprolol succinate 25  MG 24 hr tablet  Commonly known as:  TOPROL-XL  Take 25 mg by mouth daily.     potassium chloride SA 20 MEQ tablet  Commonly known as:  K-DUR,KLOR-CON  Take 0.5 tablets (10 mEq total) by mouth daily.     SENNA LAXATIVE PO  Take 1 tablet by mouth daily.     traZODone 50 MG tablet  Commonly known as:  DESYREL  Take 25 mg by mouth at bedtime.        Review of Systems  Cardiovascular: Negative for chest pain, palpitations and leg swelling.  Musculoskeletal: Positive for joint pain and falls. Negative for back pain.  Neurological: Negative for focal weakness.  Psychiatric/Behavioral: Negative for depression. The patient has insomnia.    She has had long-standing problems with atrial fibrillation, some history of CHF Has been maintained on amiodarone on and off for the last few years  She thinks her weight is stable She does tend to get tired easily.  She tries to use a half tablet of trazodone to help her sleep at night but no history of depression No history of CVA  She has some pain in her hands and is concerned about her deformities in the fingers    PHYSICAL EXAM:  BP 118/72 mmHg  Pulse 80  Temp(Src) 98 F (36.7 C)  Resp 14  Ht 5\' 2"  (1.575 m)  Wt 109 lb (49.442 kg)  BMI 19.93 kg/m2  SpO2 93%  GENERAL: She has a relatively small build  and well nourished  No pallor, clubbing, lymphadenopathy or edema.  Skin:  no rash or pigmentation.  EYES:  Externally normal.    ENT: Oral mucosa and tongue normal.  THYROID:  Not palpable.  HEART:  Normal  S1 and S2; 3/6 long systolic ejection murmur heard over the precordium especially apex  CHEST:  Normal shape.  Lungs: Vescicular breath sounds heard equally.  No crepitations/ wheeze.  ABDOMEN:  No distention.  Liver and spleen not palpable.  No other mass or tenderness.  JOINTS:  She has marked deformities and swelling of her finger joints from osteoarthritis.  SPINE: She has dorsal kyphosis evident.  No  tenderness  NEUROLOGICAL: .She is alert but at times during the office visit she would follow sleep She has a relatively slow gait Reflexes are normal bilaterally at biceps, difficult to elicit at ankles.  ASSESSMENT:   OSTEOPENIA with history of multiple fractures in various sites since about 1990 and height loss Although her lowest bone density has been recorded as -  2.3 in 2011 her measurements are not accurate at the hip and spine because of osteoarthritis and hip surgeries She also is prone to fractures because of history of falls  HYPOTHYROIDISM, likely to be autoimmune, long-standing and usually well controlled with 100 g of levothyroxine Has not had any recent TSH levels done She does have nonspecific fatigue and memory changes   PLAN:    She does need to be on treatment for osteoporosis to prevent further fractures  Also would consider re-evaluating for secondary causes  Considering her age and borderline renal function she can continue taking Prolia that she has started; she is unlikely to be compliant with oral bisphosphonates  Check vitamin D level and adjust her supplement as indicated  Discussed using her cane to walk consistently all the time even at home to prevent falls.  May also possibly benefit from gait training with physical therapy  Recheck thyroid levels today  Trestin Vences 09/09/2014, 3:06 PM   Addendum: Labs show normal TSH, vitamin D level is high normal and she can reduce to supplement to 5000 units 3 times a week Will have prior authorization for Prolia done

## 2014-09-10 ENCOUNTER — Encounter: Payer: Self-pay | Admitting: Endocrinology

## 2014-09-10 NOTE — Progress Notes (Signed)
Quick Note:  Please let patient know that the thyroid result is normal and no change in those needed Vitamin D level is high normal and she can take the 5000 units 3 times a week Please start prior authorization for Prolia ______

## 2014-09-14 ENCOUNTER — Telehealth: Payer: Self-pay

## 2014-09-14 ENCOUNTER — Ambulatory Visit: Payer: Medicare Other | Admitting: Podiatry

## 2014-09-15 NOTE — Telephone Encounter (Signed)
I have electronically submitted pt's info for Prolia insurance verification and will notify you once I have a response. Thank you. °

## 2014-09-21 NOTE — Telephone Encounter (Signed)
I have rec'd pt's insurance verification for Prolia.  She will have an estimated responsibility of 0% plus $166 deductible (if not met).  Please make pt aware this is an estimate and we will not know an exact amt until both her insurances have paid.  I have sent a copy of the summary of benefits to be scanned into her chart.  If [name] has not met all or any of her $166 deductible and cannot afford her injection, please advise her to contact Prolia at [phone] and select option #1 to see if she qualifies for one of their assistance programs.  If she qualifies they will instruct her how to proceed.  Please let me know if you have any questions. Thank you.

## 2014-10-02 ENCOUNTER — Encounter: Payer: Self-pay | Admitting: *Deleted

## 2014-10-06 ENCOUNTER — Ambulatory Visit: Payer: Medicare Other

## 2014-10-07 ENCOUNTER — Ambulatory Visit: Payer: Medicare Other | Admitting: Podiatry

## 2014-10-07 ENCOUNTER — Ambulatory Visit (INDEPENDENT_AMBULATORY_CARE_PROVIDER_SITE_OTHER): Payer: Medicare Other | Admitting: Podiatry

## 2014-10-07 ENCOUNTER — Encounter: Payer: Self-pay | Admitting: Podiatry

## 2014-10-07 DIAGNOSIS — B351 Tinea unguium: Secondary | ICD-10-CM | POA: Diagnosis not present

## 2014-10-07 DIAGNOSIS — M79676 Pain in unspecified toe(s): Secondary | ICD-10-CM | POA: Diagnosis not present

## 2014-10-07 NOTE — Progress Notes (Signed)
Patient ID: [name], female   DOB: Sep 14, 1918, 81 y.o.   MRN: [id number]  Subjective: This patient presents today complaining of painful toenails and requesting nail debridement  Objective: Patient appears orientated 3 with daughter present to treatment room The toenails are brittle, elongated, incurvated, hypertrophic and tender to direct palpation 6-10  Assessment: Symptomatic onychomycoses 6-10  Plan: Debrided toenails 10 mechanically and electrically without a bleeding Discuss treatment options with patient and daughter including repetitive debridement, oral medication and topical. At this time I had recommended ongoing debridement  Reappoint 3 months

## 2014-10-12 ENCOUNTER — Encounter: Payer: Self-pay | Admitting: Internal Medicine

## 2014-10-14 ENCOUNTER — Other Ambulatory Visit: Payer: Self-pay | Admitting: *Deleted

## 2014-10-14 ENCOUNTER — Ambulatory Visit (INDEPENDENT_AMBULATORY_CARE_PROVIDER_SITE_OTHER): Payer: Medicare Other

## 2014-10-14 DIAGNOSIS — M8580 Other specified disorders of bone density and structure, unspecified site: Secondary | ICD-10-CM | POA: Diagnosis not present

## 2014-10-14 MED ORDER — DENOSUMAB 60 MG/ML ~~LOC~~ SOLN
60.0000 mg | Freq: Once | SUBCUTANEOUS | Status: AC
  Administered 2014-10-14: 60 mg via SUBCUTANEOUS

## 2014-10-21 ENCOUNTER — Other Ambulatory Visit: Payer: Self-pay | Admitting: *Deleted

## 2014-10-21 ENCOUNTER — Telehealth: Payer: Self-pay | Admitting: *Deleted

## 2014-10-21 DIAGNOSIS — M810 Age-related osteoporosis without current pathological fracture: Secondary | ICD-10-CM

## 2014-10-21 MED ORDER — DENOSUMAB 60 MG/ML ~~LOC~~ SOLN: 60.0000 mg | Freq: Once | SUBCUTANEOUS | Status: DC

## 2014-10-22 MED ORDER — DENOSUMAB 60 MG/ML ~~LOC~~ SOLN
60.0000 mg | Freq: Once | SUBCUTANEOUS | Status: AC
  Administered 2014-10-22: 60 mg via SUBCUTANEOUS

## 2014-10-26 ENCOUNTER — Telehealth: Payer: Self-pay | Admitting: Endocrinology

## 2014-10-26 NOTE — Telephone Encounter (Signed)
Name: [name] Gender: Female DOB: 04-Jan-1919 Age: 81 Y 4 D Return Phone Number: [phone] (Primary), [phone] (Secondary) Address: City/State/Zip: [place] Client [facility] Night - Client Client Site  Endocrinology Physician Reather Littler Contact Type Call Call Type Triage / Clinical Caller Name Lamar Blinks Relationship To Patient Son Return Phone Number [phone] (Primary) Chief Complaint Medical Device, Procedure and Surgery Questions (non symptomatic) Initial Comment Caller states, mother had blood work done 8/17 from Dr Lucianne Muss, the PCP did not get those results, now the PCP wants more blood work. He wants to know if they can still get those reults and if they will still be valid rather than do more blood work. Nurse Assessment Nurse: Scarlette Ar, RN, Heather Date/Time (Eastern Time): 10/24/2014 9:40:10 AM Confirm and document reason for call. If symptomatic, describe symptoms. ---Caller states that they needs the blood work that was done on 08/17 sent over to patient's PCP Dr. Rinaldo Cloud. Her PCP is wanting to see the Digoxin lever, the BMP and TSH if they were done.. Has the patient traveled out of the country within the last 30 days? ---Not Applicable

## 2014-10-30 NOTE — Telephone Encounter (Signed)
Entered in error

## 2014-11-30 ENCOUNTER — Ambulatory Visit (INDEPENDENT_AMBULATORY_CARE_PROVIDER_SITE_OTHER): Payer: Medicare Other | Admitting: *Deleted

## 2014-11-30 DIAGNOSIS — I495 Sick sinus syndrome: Secondary | ICD-10-CM | POA: Diagnosis not present

## 2014-12-01 LAB — CUP PACEART REMOTE DEVICE CHECK
Battery Remaining Longevity: 52 mo
Brady Statistic AP VS Percent: 6.7 %
Brady Statistic AS VP Percent: 5.4 %
Brady Statistic RA Percent Paced: 22 %
Date Time Interrogation Session: 20161107075421
Implantable Lead Implant Date: 20100817
Implantable Lead Implant Date: 20100817
Implantable Lead Location: 753860
Lead Channel Pacing Threshold Amplitude: 1.75 V
Lead Channel Pacing Threshold Pulse Width: 0.5 ms
Lead Channel Setting Pacing Amplitude: 1.75 V
Lead Channel Setting Pacing Amplitude: 2 V
Lead Channel Setting Pacing Pulse Width: 0.5 ms
Lead Channel Setting Sensing Sensitivity: 0.5 mV
MDC IDC LEAD LOCATION: 753859
MDC IDC MSMT BATTERY REMAINING PERCENTAGE: 51 %
MDC IDC MSMT BATTERY VOLTAGE: 2.87 V
MDC IDC MSMT LEADCHNL RA IMPEDANCE VALUE: 360 Ohm
MDC IDC MSMT LEADCHNL RV IMPEDANCE VALUE: 360 Ohm
MDC IDC MSMT LEADCHNL RV SENSING INTR AMPL: 8.7 mV
MDC IDC STAT BRADY AP VP PERCENT: 86 %
MDC IDC STAT BRADY AS VS PERCENT: 1.9 %
MDC IDC STAT BRADY RV PERCENT PACED: 92 %
Pulse Gen Model: 2210
Pulse Gen Serial Number: 7062185

## 2014-12-01 NOTE — Progress Notes (Signed)
Remote pacemaker transmission.   

## 2014-12-02 ENCOUNTER — Encounter: Payer: Self-pay | Admitting: Cardiology

## 2014-12-04 ENCOUNTER — Other Ambulatory Visit: Payer: Medicare Other

## 2014-12-09 ENCOUNTER — Ambulatory Visit: Payer: Medicare Other | Admitting: Endocrinology

## 2014-12-15 ENCOUNTER — Other Ambulatory Visit: Payer: Medicare Other

## 2014-12-15 ENCOUNTER — Ambulatory Visit: Payer: Medicare Other | Admitting: Endocrinology

## 2014-12-23 ENCOUNTER — Ambulatory Visit: Payer: Medicare Other | Admitting: Endocrinology

## 2014-12-28 ENCOUNTER — Other Ambulatory Visit (INDEPENDENT_AMBULATORY_CARE_PROVIDER_SITE_OTHER): Payer: Medicare Other

## 2014-12-28 ENCOUNTER — Other Ambulatory Visit: Payer: Self-pay | Admitting: Endocrinology

## 2014-12-28 DIAGNOSIS — E038 Other specified hypothyroidism: Secondary | ICD-10-CM | POA: Diagnosis not present

## 2014-12-28 DIAGNOSIS — M8580 Other specified disorders of bone density and structure, unspecified site: Secondary | ICD-10-CM

## 2014-12-28 DIAGNOSIS — E063 Autoimmune thyroiditis: Secondary | ICD-10-CM

## 2014-12-28 LAB — T4, FREE: Free T4: 1.91 ng/dL — ABNORMAL HIGH (ref 0.60–1.60)

## 2014-12-28 LAB — TSH: TSH: 2.81 u[IU]/mL (ref 0.35–4.50)

## 2014-12-29 LAB — BASIC METABOLIC PANEL
BUN: 30 mg/dL — AB (ref 6–23)
CO2: 26 meq/L (ref 19–32)
Calcium: 9.4 mg/dL (ref 8.4–10.5)
Chloride: 107 mEq/L (ref 96–112)
Creatinine, Ser: 1.14 mg/dL (ref 0.40–1.20)
GFR: 47.15 mL/min — ABNORMAL LOW (ref >60.00)
Glucose, Bld: 117 mg/dL — ABNORMAL HIGH (ref 70–99)
POTASSIUM: 5.2 meq/L — AB (ref 3.5–5.1)
Sodium: 146 mEq/L — ABNORMAL HIGH (ref 135–145)

## 2014-12-31 ENCOUNTER — Ambulatory Visit (INDEPENDENT_AMBULATORY_CARE_PROVIDER_SITE_OTHER): Payer: Medicare Other | Admitting: Endocrinology

## 2014-12-31 ENCOUNTER — Ambulatory Visit: Payer: Medicare Other | Admitting: Endocrinology

## 2014-12-31 ENCOUNTER — Encounter: Payer: Self-pay | Admitting: Endocrinology

## 2014-12-31 VITALS — BP 122/78 | HR 94 | Temp 97.7°F | Resp 14 | Ht 62.0 in | Wt 106.4 lb

## 2014-12-31 DIAGNOSIS — E038 Other specified hypothyroidism: Secondary | ICD-10-CM | POA: Diagnosis not present

## 2014-12-31 DIAGNOSIS — M8580 Other specified disorders of bone density and structure, unspecified site: Secondary | ICD-10-CM | POA: Diagnosis not present

## 2014-12-31 DIAGNOSIS — E063 Autoimmune thyroiditis: Secondary | ICD-10-CM

## 2014-12-31 NOTE — Progress Notes (Signed)
Patient ID: [name], female   DOB: [birth year], 81 y.o.   MRN: [id number]           Chief complaint: Osteoporosis and low thyroid, follow-up visit  History of Present Illness:  PROBLEM 1:  She has a long history of low trauma fracture and at least a 3 inch height loss   She had a screening bone density done and this showed the following T-scores: Femoral neck: In 11/2009 was -2.3 and total left hip was -1.7 Spine: Non-measurable Forearm measured in 2014 showed T score -1.6  Her history is obtained mostly from her son, has some records from the [facility] but these have very little information about her osteoporosis Hip fractures in 1990, 2013; femur, traumatic ?  2011; clavicle 05/2014, she had 3 compression vertebral fractures in 2012 secondary to a fall Age at menopause: About 47  Previous treatment: Prolia, possibly got 2 injections in 2014 and may have had one in 2015, records not available Fosamax: Difficult to comply with this and did not continue Calcium supplements: Calmag Vitamin D supplements: D3, 5000 units 3 times a week now, her last level was 78   She has been on Prolia recently and got her last injection in 09/2014  Calcium and renal function unchanged  PROBLEM 2: HYPOTHYROIDISM  Patient is not a good historian but she thinks she was told to have hypothyroidism 30-40 years ago, this was not postpartum She thinks she was having symptoms of fatigue before she was started on thyroid hormones and she felt better with thyroid supplementation  [facility] she was told to take an extra half tablet of her Synthroid 100 g daily and follow-up TSH in October was normal She has not had any unusual fatigue and her TSH is again consistently normal   Lab Results  Component Value Date   TSH 2.81 12/28/2014   TSH 1.65 09/09/2014   TSH 5.630* 04/23/2013   FREET4 1.91* 12/28/2014   FREET4 1.56 09/09/2014   FREET4 1.79 04/23/2013       Past Medical History  Diagnosis Date  . Pacemaker   . A-fib (HCC)   . Hypertension   . Hypothyroidism   . Hyperlipemia   . CHF (congestive heart failure) (HCC)   . Osteoarthritis   . Osteoporosis   . Aortic stenosis, severe   . Sick sinus syndrome (HCC)   . GI bleed   . Hip fracture (HCC)   . Tachypnea     Past Surgical History  Procedure Laterality Date  . Humerus fracture surgery    . Hip arthroplasty  08/11/2011    Procedure: ARTHROPLASTY BIPOLAR HIP;  Surgeon: Shelda PalMatthew D Olin, MD;  [facility];  Service: Orthopedics;  Laterality: Left;    Family History  Problem Relation Age of Onset  . Bipolar disorder Mother     Social History:  reports that she has never smoked. She has never used smokeless tobacco. She reports that she drinks alcohol. She reports that she does not use illicit drugs.  Allergies: No Known Allergies    Medication List       This list is accurate as of: 12/31/14  3:59 PM.  Always use your most recent med list.               acetaminophen 650 MG CR tablet  Commonly known as:  TYLENOL  Take 650 mg by mouth at bedtime. Take 2 tablets by mouth at bedtime daily  amiodarone 200 MG tablet  Commonly known as:  PACERONE  Take 200 mg by mouth daily.     aspirin 81 MG tablet  Take 81 mg by mouth daily.     COLACE PO  Take 1 tablet by mouth daily.     digoxin 0.125 MG tablet  Commonly known as:  LANOXIN  Take 0.0625 mg by mouth daily.     furosemide 40 MG tablet  Commonly known as:  LASIX  Take 40 mg by mouth. Takes 1/2 tablet in am     levothyroxine 100 MCG tablet  Commonly known as:  SYNTHROID, LEVOTHROID  Take 100 mcg by mouth daily. Takes 1.5 tabs on sundays only Takes 1 tab all other days     magnesium 30 MG tablet  Take 10 mg by mouth daily.     metoprolol succinate 25 MG 24 hr tablet  Commonly known as:  TOPROL-XL  Take 25 mg by mouth daily.     SENNA LAXATIVE PO  Take 1 tablet by mouth daily.     traZODone  50 MG tablet  Commonly known as:  DESYREL  Take 25 mg by mouth at bedtime.        ROS She has had long-standing problems with atrial fibrillation, some history of CHF Has been maintained on amiodarone on and off for the last few years  Her son is asking whether she can take OTC zinc supplement when she has a cough which she thinks helps  Hyperkalemia: Her labs were forwarded to PCP and he has stopped her potassium supplement   PHYSICAL EXAM:  BP 122/78 mmHg  Pulse 94  Temp(Src) 97.7 F (36.5 C)  Resp 14  Ht  (1.575 m)  Wt 106 lb 6.4 oz (48.263 kg)  BMI 19.46 kg/m2  SpO2     ASSESSMENT:   OSTEOPENIA with history of multiple fractures in various sites since about 1990 and height loss She also is prone to fractures because of history of falls  HYPOTHYROIDISM, likely to be autoimmune, long-standing and usually well controlled with 100 g of levothyroxine, 7-1/2 tablets a week She does have nonspecific fatigue and memory changes   PLAN:   She does need to be on Prolia long-term and will scheduled for March Continue vitamin D3 3 days a week and calcium supplement  No change in thyroid levels Consider reducing thyroid supplement if TSH is low normal  Kristofer Schaffert 12/31/2014, 3:59 PM

## 2015-01-06 ENCOUNTER — Ambulatory Visit (INDEPENDENT_AMBULATORY_CARE_PROVIDER_SITE_OTHER): Payer: Medicare Other | Admitting: Podiatry

## 2015-01-06 ENCOUNTER — Encounter: Payer: Self-pay | Admitting: Podiatry

## 2015-01-06 DIAGNOSIS — M722 Plantar fascial fibromatosis: Secondary | ICD-10-CM

## 2015-01-06 DIAGNOSIS — B351 Tinea unguium: Secondary | ICD-10-CM | POA: Diagnosis not present

## 2015-01-06 DIAGNOSIS — M79676 Pain in unspecified toe(s): Secondary | ICD-10-CM

## 2015-01-06 NOTE — Patient Instructions (Signed)
Today your complaining of one-month history of pain on the bottom of your left heel. The pain is consistent with plantar fasciitis. I recommended shoe wear shoes at all times. Also, you may apply a thin cushion inside your left shoe Okay to friction massage the heel with your thumbs and apply skin lotion when massaging Okay to use a towel and pull your toes up toward your nose 1-2 minutes

## 2015-01-07 NOTE — Progress Notes (Signed)
Patient ID: [name], female   DOB: 23-Sep-1918, 81 y.o.   MRN: [id number]  Subjective: This patient presents again for a scheduled visit with son present in treatment room complaining of thick, long toenails that are uncomfortable when wearing shoes and request debridement of these toenails. Also, patient is complaining of inferior left heel pain upon weight-bearing undetermined duration. Patient's son suggest patient is been walking without shoes at times as well as having a limb length and quality  Objective: Patient is responsive, however needs to repeat questioning No open skin lesions bilaterally Palpable tenderness inferior left heel and medial plantar fascial left heel without any palpable lesions. This duplicates area of discomfort There is no tenderness to palpation the medial lateral left calcaneus  The toenails are hypertrophic, elongated, incurvated, discolored and tender to direct palpation 6-10  Assessment: Plantar fasciitis left Symptomatic onychomycoses 6-10  Plan: Today I reviewed the results of examination and son and informed him that the left heel pain was consistent with plantar fasciitis left. I recommended shoe wearing at all times as well as an additional thin cushion placed inside the left shoe. Stretching and massage discussed for plantar fasciitis left  The toenails 6-10 were debrided mechanically and electric without any bleeding  Reappoint 3 months

## 2015-02-12 ENCOUNTER — Encounter: Payer: Self-pay | Admitting: Podiatry

## 2015-02-12 ENCOUNTER — Ambulatory Visit (INDEPENDENT_AMBULATORY_CARE_PROVIDER_SITE_OTHER): Payer: Medicare Other | Admitting: Podiatry

## 2015-02-12 ENCOUNTER — Ambulatory Visit (INDEPENDENT_AMBULATORY_CARE_PROVIDER_SITE_OTHER): Payer: Medicare Other

## 2015-02-12 VITALS — BP 147/94 | HR 80 | Resp 12

## 2015-02-12 DIAGNOSIS — R52 Pain, unspecified: Secondary | ICD-10-CM

## 2015-02-12 DIAGNOSIS — L089 Local infection of the skin and subcutaneous tissue, unspecified: Secondary | ICD-10-CM

## 2015-02-12 DIAGNOSIS — L98491 Non-pressure chronic ulcer of skin of other sites limited to breakdown of skin: Secondary | ICD-10-CM

## 2015-02-12 DIAGNOSIS — L89891 Pressure ulcer of other site, stage 1: Secondary | ICD-10-CM

## 2015-02-12 MED ORDER — DOXYCYCLINE HYCLATE 100 MG PO TABS: 100.0000 mg | ORAL_TABLET | Freq: Two times a day (BID) | ORAL | Status: DC

## 2015-02-12 NOTE — Progress Notes (Signed)
Subjective:     Patient ID: [name], female   DOB: [birth year], 82 y.o.   MRN: [id number]  HPI this patient presents to the office with significant pain noted on her right big toe. She says that the toe has been red and swollen and infected for 3 weeks. She also says that she has a painful skin lesion on the inside of the right big toe. She says that she has tried to soak her foot but her son says it's in the cleaning bucket.  The  right foot has a purplish and redness discoloration extending over the top of the right foot. She says she can barely wear shoes at this time. She presents to the office for an evaluation and treatment of her right foot   Review of Systems     Objective:   Physical Exam GENERAL APPEARANCE: Alert, conversant. Appropriately groomed. No acute distress.  VASCULAR: Pedal pulses palpable at non palpable at   [facility] and PT bilateral.  Capillary refill time is diminished to all digits,  Cold feet noted.   NEUROLOGIC: sensation is normal to 5.07 monofilament at 5/5 sites bilateral.  Light touch is intact bilateral, Muscle strength normal.  MUSCULOSKELETAL: acceptable muscle strength, tone and stability bilateral.  Intrinsic muscluature intact bilateral.  Rectus appearance of foot and digits noted bilateral.   DERMATOLOGIC: skin color, texture, and turgor are within normal limits.   No interdigital maceration noted.    Digital nails are asymptomatic. Marland Kitchen There is 3 mm. X 3 mm. ulcer on medial aspect of right hallux.  No pus or drainage noted.  Absence of skin noted medial hallux. There is increased redness and swelling right hallux with redness and swelling extending over midfoot.       Assessment:     Diabetic infected ulcer right hallux.     Plan:     ROV  Xray reveal no bony pathology.   Betadine/DSD applied to right hallux onto ulcer.  Prescribed doxycycline.  Home soaks recommended with redressing ulcer right hallux.  RTC 1 week.   Helane Gunther DPM

## 2015-02-19 ENCOUNTER — Ambulatory Visit (INDEPENDENT_AMBULATORY_CARE_PROVIDER_SITE_OTHER): Payer: Medicare Other | Admitting: Podiatry

## 2015-02-19 ENCOUNTER — Encounter: Payer: Self-pay | Admitting: Podiatry

## 2015-02-19 DIAGNOSIS — L98491 Non-pressure chronic ulcer of skin of other sites limited to breakdown of skin: Secondary | ICD-10-CM

## 2015-02-19 DIAGNOSIS — L089 Local infection of the skin and subcutaneous tissue, unspecified: Secondary | ICD-10-CM

## 2015-02-19 NOTE — Progress Notes (Signed)
Subjective:     Patient ID: [name], female   DOB: [birth year], 82 y.o.   MRN: [id number]  HPI this patient presents to the office follow-up for diagnosis of a diabetic infected ulcer right big toe. She was treated last week and told to soak her foot and bandaging her foot after soaking his also prescribed doxycycline to take 2 help resolve her infection. She has been soaking and taking her medicine as prescribed. She presents the office today stating that the swelling has gone down since yesterday and that she is having less pain associated with her big toe. She presents the office for continued evaluation and treatment of her right infected ulcer   Review of Systems     Objective:   Physical Exam  Objective:   Physical Exam GENERAL APPEARANCE: Alert, conversant. Appropriately groomed. No acute distress.  VASCULAR: Pedal pulses palpable at non palpable at [facility] and PT bilateral. Capillary refill time is diminished to all digits, Cold feet noted.  NEUROLOGIC: sensation is normal to 5.07 monofilament at 5/5 sites bilateral. Light touch is intact bilateral, Muscle strength normal.  MUSCULOSKELETAL: acceptable muscle strength, tone and stability bilateral. Intrinsic muscluature intact bilateral. Rectus appearance of foot and digits noted bilateral.   DERMATOLOGIC: skin color, texture, and turgor are within normal limits. No interdigital maceration noted. Digital nails are asymptomatic. Marland Kitchen There is healing  ulcer on medial aspect of right hallux. No pus or drainage noted. Skin has covered her ulcer. medial hallux. There is resolved  redness and swelling right hallux with redness and swelling .  The right foot has healed .            Assessment:     Diabetic infected ulcer right foot.     Plan:     ROV  Told to finish antibiotics.  Toe cap was dispensed to help protect her ulcer from breaking down again. Told her that I am concerned about her reddish color and  swelling right foot that is not present in left foot.  Asked her to be evaluated by her cardiologist.   Helane Gunther DPM

## 2015-03-01 ENCOUNTER — Telehealth: Payer: Self-pay | Admitting: Cardiology

## 2015-03-01 ENCOUNTER — Ambulatory Visit (INDEPENDENT_AMBULATORY_CARE_PROVIDER_SITE_OTHER): Payer: Medicare Other | Admitting: *Deleted

## 2015-03-01 DIAGNOSIS — I495 Sick sinus syndrome: Secondary | ICD-10-CM | POA: Diagnosis not present

## 2015-03-01 NOTE — Telephone Encounter (Signed)
Spoke with pt and reminded pt of remote transmission that is due today. Pt verbalized understanding.   

## 2015-03-02 NOTE — Progress Notes (Signed)
Remote pacemaker transmission.   

## 2015-03-10 ENCOUNTER — Ambulatory Visit (INDEPENDENT_AMBULATORY_CARE_PROVIDER_SITE_OTHER): Payer: Medicare Other | Admitting: Podiatry

## 2015-03-10 ENCOUNTER — Encounter: Payer: Self-pay | Admitting: Podiatry

## 2015-03-10 DIAGNOSIS — M79676 Pain in unspecified toe(s): Secondary | ICD-10-CM | POA: Diagnosis not present

## 2015-03-10 DIAGNOSIS — B351 Tinea unguium: Secondary | ICD-10-CM

## 2015-03-10 NOTE — Patient Instructions (Signed)
There was a small 1 x 2 mm skin ulcer on the side of the right great toe a clinically does not appear infected. Okay to rinse 1-2 minutes in soapy water and apply a small amount of triple antibiotic ointment and a Band-Aid to the area. If you notice any sudden increase in pain, swelling, fever present to the emergency department  Reevaluate 2 weeks

## 2015-03-11 NOTE — Progress Notes (Signed)
Patient ID: [name], female   DOB: [birth year], 82 y.o.   MRN: [id number]   Subjective: This patient presents today with her son present in the treatment room for scheduled visit for toenail debridement. She is complaining the toenails are uncomfortable walking wearing shoes. Also, there is a history of a skin ulcer on the right hallux which was treated by Dr. Stacie Acres  on the visit of 02/19/2015. At that time there was a note that the redness in the right hallux had resolved patient was told to complete any remaining oral antibiotics.. This note stated that patient was taking antibiotics (doxycycline) and soaking A toe cap was dispensed to protect the ulcer from breaking down. Patient's son stating that the patient applying nuskin over the previous ulcerated area on the right hallux  Objective: Pleasant orientated 3 Vascular: DP and PT pulses trace palpable bilaterally Generalized rubor right foot Medial border the right hallux has crusted area from application of the Nuskin. The area was gently massaged off with skin cleanser and there was a superficial ulcer 1 x 2 mm with a granular base. There is no active drainage, warmth, malodor surrounding the superficial ulcer The toenails elongated, brittle, deformed 6-10 Limb length inequality requiring shoe left  Assessment: Noninfected superficial skin ulceration medial border right hallux Symptomatic onychomycoses 6-10  Plan: Patient was advised to discontinue the application of the topical Nuskin and apply triple antibiotic ointment and a Band-Aid to the superficial ulcer in the right hallux daily  Patient advised to observe this area to see if there is any sudden increase pain, swelling, redness, femur  present to emergency room.  The toenails 6-10 are debrided mechanically electronically without any bleeding  Reappoint 2 weeks

## 2015-03-23 ENCOUNTER — Encounter (HOSPITAL_BASED_OUTPATIENT_CLINIC_OR_DEPARTMENT_OTHER): Payer: Medicare Other | Attending: Surgery

## 2015-03-23 DIAGNOSIS — M19079 Primary osteoarthritis, unspecified ankle and foot: Secondary | ICD-10-CM | POA: Diagnosis not present

## 2015-03-23 DIAGNOSIS — Z7982 Long term (current) use of aspirin: Secondary | ICD-10-CM | POA: Insufficient documentation

## 2015-03-23 DIAGNOSIS — E785 Hyperlipidemia, unspecified: Secondary | ICD-10-CM | POA: Diagnosis not present

## 2015-03-23 DIAGNOSIS — I110 Hypertensive heart disease with heart failure: Secondary | ICD-10-CM | POA: Diagnosis not present

## 2015-03-23 DIAGNOSIS — I509 Heart failure, unspecified: Secondary | ICD-10-CM | POA: Insufficient documentation

## 2015-03-23 DIAGNOSIS — I70235 Atherosclerosis of native arteries of right leg with ulceration of other part of foot: Secondary | ICD-10-CM | POA: Diagnosis not present

## 2015-03-23 DIAGNOSIS — Z950 Presence of cardiac pacemaker: Secondary | ICD-10-CM | POA: Diagnosis not present

## 2015-03-23 DIAGNOSIS — E039 Hypothyroidism, unspecified: Secondary | ICD-10-CM | POA: Diagnosis not present

## 2015-03-23 DIAGNOSIS — Z79899 Other long term (current) drug therapy: Secondary | ICD-10-CM | POA: Diagnosis not present

## 2015-03-23 DIAGNOSIS — Z96642 Presence of left artificial hip joint: Secondary | ICD-10-CM | POA: Diagnosis not present

## 2015-03-23 DIAGNOSIS — M1990 Unspecified osteoarthritis, unspecified site: Secondary | ICD-10-CM | POA: Diagnosis not present

## 2015-03-23 DIAGNOSIS — I4891 Unspecified atrial fibrillation: Secondary | ICD-10-CM | POA: Diagnosis not present

## 2015-03-23 DIAGNOSIS — L97511 Non-pressure chronic ulcer of other part of right foot limited to breakdown of skin: Secondary | ICD-10-CM | POA: Insufficient documentation

## 2015-03-24 ENCOUNTER — Ambulatory Visit: Payer: Medicare Other | Admitting: Podiatry

## 2015-03-26 ENCOUNTER — Encounter (HOSPITAL_BASED_OUTPATIENT_CLINIC_OR_DEPARTMENT_OTHER): Payer: Medicare Other

## 2015-03-26 ENCOUNTER — Ambulatory Visit (HOSPITAL_COMMUNITY)
Admission: RE | Admit: 2015-03-26 | Discharge: 2015-03-26 | Disposition: A | Discharge status: Home / Self Care | Payer: Medicare Other | Source: Ambulatory Visit | Attending: Vascular Surgery | Admitting: Vascular Surgery

## 2015-03-26 ENCOUNTER — Other Ambulatory Visit: Payer: Self-pay | Admitting: Surgery

## 2015-03-26 DIAGNOSIS — L97519 Non-pressure chronic ulcer of other part of right foot with unspecified severity: Secondary | ICD-10-CM | POA: Diagnosis not present

## 2015-03-26 DIAGNOSIS — R938 Abnormal findings on diagnostic imaging of other specified body structures: Secondary | ICD-10-CM | POA: Diagnosis not present

## 2015-03-26 DIAGNOSIS — E785 Hyperlipidemia, unspecified: Secondary | ICD-10-CM | POA: Insufficient documentation

## 2015-03-26 DIAGNOSIS — I509 Heart failure, unspecified: Secondary | ICD-10-CM | POA: Diagnosis not present

## 2015-03-26 DIAGNOSIS — I110 Hypertensive heart disease with heart failure: Secondary | ICD-10-CM | POA: Insufficient documentation

## 2015-03-31 ENCOUNTER — Encounter (HOSPITAL_COMMUNITY): Payer: Medicare Other

## 2015-04-02 ENCOUNTER — Ambulatory Visit (HOSPITAL_COMMUNITY)
Admission: RE | Admit: 2015-04-02 | Discharge: 2015-04-02 | Disposition: A | Discharge status: Home / Self Care | Payer: Medicare Other | Source: Ambulatory Visit | Attending: Vascular Surgery | Admitting: Vascular Surgery

## 2015-04-02 ENCOUNTER — Other Ambulatory Visit: Payer: Self-pay | Admitting: Surgery

## 2015-04-02 DIAGNOSIS — I110 Hypertensive heart disease with heart failure: Secondary | ICD-10-CM | POA: Diagnosis not present

## 2015-04-02 DIAGNOSIS — I509 Heart failure, unspecified: Secondary | ICD-10-CM | POA: Insufficient documentation

## 2015-04-02 DIAGNOSIS — E785 Hyperlipidemia, unspecified: Secondary | ICD-10-CM | POA: Diagnosis not present

## 2015-04-02 DIAGNOSIS — L98499 Non-pressure chronic ulcer of skin of other sites with unspecified severity: Secondary | ICD-10-CM

## 2015-04-06 ENCOUNTER — Telehealth: Payer: Self-pay | Admitting: Surgery

## 2015-04-06 ENCOUNTER — Encounter (HOSPITAL_BASED_OUTPATIENT_CLINIC_OR_DEPARTMENT_OTHER): Payer: Medicare Other | Attending: Internal Medicine

## 2015-04-06 ENCOUNTER — Telehealth: Payer: Self-pay

## 2015-04-06 DIAGNOSIS — L97511 Non-pressure chronic ulcer of other part of right foot limited to breakdown of skin: Secondary | ICD-10-CM | POA: Insufficient documentation

## 2015-04-06 DIAGNOSIS — M1990 Unspecified osteoarthritis, unspecified site: Secondary | ICD-10-CM | POA: Diagnosis not present

## 2015-04-06 DIAGNOSIS — I4891 Unspecified atrial fibrillation: Secondary | ICD-10-CM | POA: Insufficient documentation

## 2015-04-06 DIAGNOSIS — I10 Essential (primary) hypertension: Secondary | ICD-10-CM | POA: Insufficient documentation

## 2015-04-06 DIAGNOSIS — Z96642 Presence of left artificial hip joint: Secondary | ICD-10-CM | POA: Insufficient documentation

## 2015-04-06 DIAGNOSIS — I70235 Atherosclerosis of native arteries of right leg with ulceration of other part of foot: Secondary | ICD-10-CM | POA: Insufficient documentation

## 2015-04-06 DIAGNOSIS — I509 Heart failure, unspecified: Secondary | ICD-10-CM | POA: Insufficient documentation

## 2015-04-06 DIAGNOSIS — Z950 Presence of cardiac pacemaker: Secondary | ICD-10-CM | POA: Diagnosis not present

## 2015-04-06 NOTE — Telephone Encounter (Signed)
notified patient's son, Jonny RuizJohn, that 04-16-15 was the soonest appointment available.  i told him i would put her on the wait list just in case something opened up

## 2015-04-06 NOTE — Telephone Encounter (Signed)
Phone call from pt's son, Jonny RuizJohn.  Requested an urgent appt., after meeting with [facility]Britto [facility].  Reported that he was advised due to the studies done 3/3 and 3/10, and current condition of right foot with nonhealing sore, cold and bluish discoloration to the foot, it was more urgent that she be evaluated.  Noted the referral on 2/28 by Dr. Meyer RusselBritto was marked urgent.  Currently pt. is scheduled for appt. 04/16/15; son is requesting to try to move appt. to an earlier date.

## 2015-04-07 ENCOUNTER — Ambulatory Visit: Payer: Medicare Other | Admitting: Podiatry

## 2015-04-09 ENCOUNTER — Ambulatory Visit (INDEPENDENT_AMBULATORY_CARE_PROVIDER_SITE_OTHER): Payer: Medicare Other | Admitting: Vascular Surgery

## 2015-04-09 ENCOUNTER — Encounter: Payer: Self-pay | Admitting: Vascular Surgery

## 2015-04-09 ENCOUNTER — Other Ambulatory Visit: Payer: Self-pay

## 2015-04-09 VITALS — BP 134/70 | HR 60 | Temp 97.0°F | Ht 62.0 in | Wt 102.0 lb

## 2015-04-09 DIAGNOSIS — I7025 Atherosclerosis of native arteries of other extremities with ulceration: Secondary | ICD-10-CM | POA: Insufficient documentation

## 2015-04-09 NOTE — Progress Notes (Signed)
Referred by:  Rinaldo Cloud, MD [phone] [address] [place], [place] [address]  Reason for referral: right great toe ulcer  History of Present Illness  [name] is a 82 y.o. (10/28/1918) female who presents with chief complaint: right great toe ulcer.  Onset of symptom occurred December 2016.  She is unclear the etiology of this ulcer.  She has pain in the ulcer that is described as sharp, severity moderate-severe, and associated with rest and manipulating the wound.  The patient also notes some nocturnal rest pain.  Patient has attempted to treat this pain with wound care.  Wound care has had no progress, so she was referred to [place] for evaluation.  She denies any fever or chills or drainage from the ulcer.  Atherosclerotic risk factors include: HTN, HLD.  Patient has significant cardiac history with afib, CHF, sick sinus syndrome and reported severe AS.  Past Medical History  Diagnosis Date  . Pacemaker   . A-fib (HCC)   . Hypertension   . Hypothyroidism   . Hyperlipemia   . CHF (congestive heart failure) (HCC)   . Osteoarthritis   . Osteoporosis   . Aortic stenosis, severe   . Sick sinus syndrome (HCC)   . GI bleed   . Hip fracture (HCC)   . Tachypnea     Past Surgical History  Procedure Laterality Date  . Humerus fracture surgery    . Hip arthroplasty  08/11/2011    Procedure: ARTHROPLASTY BIPOLAR HIP;  Surgeon: Shelda Pal, MD;  Location: [facility];  Service: Orthopedics;  Laterality: Left;    Social History   Social History  . Marital Status: Single    Spouse Name: N/A  . Number of Children: N/A  . Years of Education: N/A   Occupational History  . Not on file.   Social History Main Topics  . Smoking status: Never Smoker   . Smokeless tobacco: Never Used  . Alcohol Use: 0.0 oz/week     Comment: once a week, on Sunday  . Drug Use: No  . Sexual Activity: Not on file   Other Topics Concern  . Not on file   Social History Narrative     Family History  Problem Relation Age of Onset  . Bipolar disorder Mother     Current Outpatient Prescriptions  Medication Sig Dispense Refill  . acetaminophen (TYLENOL) 650 MG CR tablet Take 650 mg by mouth at bedtime. Take 2 tablets by mouth at bedtime daily    . amiodarone (PACERONE) 200 MG tablet Take 200 mg by mouth daily.    Marland Kitchen aspirin 81 MG tablet Take 81 mg by mouth daily.    . digoxin (LANOXIN) 0.125 MG tablet Take 0.0625 mg by mouth daily.    Tery Sanfilippo Sodium (COLACE PO) Take 1 tablet by mouth daily.    Marland Kitchen doxycycline (VIBRA-TABS) 100 MG tablet Take 1 tablet (100 mg total) by mouth 2 (two) times daily. 20 tablet 0  . furosemide (LASIX) 40 MG tablet Take 40 mg by mouth. Takes 1/2 tablet in am    . levothyroxine (SYNTHROID, LEVOTHROID) 100 MCG tablet Take 100 mcg by mouth daily. Takes 1.5 tabs on sundays only Takes 1 tab all other days    . magnesium 30 MG tablet Take 10 mg by mouth daily.    . metoprolol succinate (TOPROL-XL) 25 MG 24 hr tablet Take 25 mg by mouth daily.    . Sennosides (SENNA LAXATIVE PO) Take 1 tablet  by mouth daily.    . traZODone (DESYREL) 50 MG tablet Take 25 mg by mouth at bedtime.     Current Facility-Administered Medications  Medication Dose Route Frequency Provider Last Rate Last Dose  . denosumab (PROLIA) injection 60 mg  60 mg Subcutaneous Once Reather LittlerAjay Kumar, MD         No Known Allergies   REVIEW OF SYSTEMS:  (Positives checked otherwise negative)  CARDIOVASCULAR:   [ ]  chest pain,  [ ]  chest pressure,  [x]  palpitations,  [ ]  shortness of breath when laying flat,  [ ]  shortness of breath with exertion,   [x]  pain in feet when walking,  [x]  pain in feet when laying flat, [ ]  history of blood clot in veins (DVT),  [ ]  history of phlebitis,  [ ]  swelling in legs,  [ ]  varicose veins  PULMONARY:   [ ]  productive cough,  [ ]  asthma,  [ ]  wheezing  NEUROLOGIC:   [x]  weakness in arms or legs,  [ ]  numbness in arms or legs,  [ ]   difficulty speaking or slurred speech,  [ ]  temporary loss of vision in one eye,  [ ]  dizziness  HEMATOLOGIC:   [ ]  bleeding problems,  [ ]  problems with blood clotting too easily  MUSCULOSKEL:   [ ]  joint pain, [ ]  joint swelling  GASTROINTEST:   [ ]  vomiting blood,  [ ]  blood in stool     GENITOURINARY:   [ ]  burning with urination,  [ ]  blood in urine  PSYCHIATRIC:   [ ]  history of major depression  INTEGUMENTARY:   [ ]  rashes,  [x]  ulcers  CONSTITUTIONAL:   [ ]  fever,  [ ]  chills   For VQI Use Only  PRE-ADM LIVING: Nursing home  AMB STATUS: Ambulatory with Assistance  CAD Sx: None  PRIOR CHF: Mild  STRESS TEST: [c] No, [ ]  Normal, [ ]  + ischemia, [ ]  + MI, [ ]  Both   Physical Examination  Filed Vitals:   04/09/15 1318  BP: 134/70  Pulse: 60  Temp: 97 F (36.1 C)  TempSrc: Oral  Height: 5\' 2"  (1.575 m)  Weight: 102 lb (46.267 kg)  SpO2: 96%   Body mass index is 18.65 kg/(m^2).  General: A&O x 3, WD, elderly, Cachectic,   Head: [place]/AT  Ear/Nose/Throat: Hearing grossly intact, nares w/o erythema or drainage, oropharynx w/o Erythema/Exudate, Mallampati score: 3, cachectic temporalis muscles  Eyes: PERRLA, EOMI  Neck: Supple, nuchal rigidity, palpable LAD  Pulmonary: Sym exp, good air movt, CTAB, no rales, rhonchi, & wheezing  Cardiac: irr, irr rate and rhythm; Nl S1, S2, no rubs or gallops, +SEM  Vascular: Vessel Right Left  Radial Faintly Palpable Faintly Palpable  Brachial Palpable Palpable  Carotid Palpable, without bruit Palpable, without bruit  Aorta Not palpable N/A  Femoral Palpable Palpable  Popliteal Not palpable Not palpable  PT Not Palpable Not Palpable  DP Not Palpable Not Palpable   Gastrointestinal: soft, NTND, -G/R, - HSM, - masses, - CVAT B  Musculoskeletal: M/S 5/5 throughout , B cyanotic toes, R foot with dependent rubor, LLE > RLE LDS, varicosities and spider veins B, cool feet (R>L), clean ulcer on medial aspect  of R great toe, no drainage, no erythema  Neurologic: CN 2-12 intact , Pain and light touch intact in extremities , Motor exam as listed above  Psychiatric: Judgment intact, Mood & affect appropriate for pt's clinical situation  Dermatologic: See M/S exam for extremity  exam, no rashes otherwise noted  Lymph : No Cervical, Axillary, or Inguinal lymphadenopathy    Non-Invasive Vascular Imaging  ABI (Date: 03/26/15)  R: 0.60, DP: not detected, PT: not detected, TBI: 0.15  L: 0.91, DP: dampened mono, PT: dampened mono, TBI: 0.35  BLE arterial duplex (Date: 04/02/15)  R: Biphasic CFA, PFA, and proximal SFA, rest of arteries mono  L: biphasic CFA, PFA, and proximal SFA, rest of arteries mono except AT which is not detected   Medical Decision Making  Katianne DELISIA MCQUISTON is a 82 y.o. female who presents with: BLE critical limb ischemia, non-healing R great toe ulcer, severe AS, CHF, afib, SSS   This patient is likely very high risk for any open surgical procedure, so she would not be a candidate for such.  I discussed with the patient the natural history of critical limb ischemia: 25% require amputation in one year, 50% are able to maintain their limbs in one year, and 25-30% die in one year due to comorbidities.  Given the limb threatening status of this patient, I recommend an aggressive work up including proceeding with an: Aortogram, Right leg runoff and possible intervention. I discussed with the patient the nature of angiographic procedures, especially the limited patencies of any endovascular intervention. The patient is aware of that the risks of an angiographic procedure include but are not limited to: bleeding, infection, access site complications, embolization, rupture of treated vessel, dissection, possible need for emergent surgical intervention, and possible need for surgical procedures to treat the patient's pathology. The patient is aware of the risks and agrees to proceed.   The procedure is scheduled for: 23 MAR 17.  I discussed in depth with the patient the nature of atherosclerosis, and emphasized the importance of maximal medical management including strict control of blood pressure, blood glucose, and lipid levels, antiplatelet agents, obtaining regular exercise, and cessation of smoking.  The patient is aware that without maximal medical management the underlying atherosclerotic disease process will progress, limiting the benefit of any interventions. The patient is currently not on a statin:  Not indicated. The patient is currently on an anti-platelet: ASA.  Thank you for allowing [place] to participate in this patient's care.   Leonides Sake, MD [facility] Office: [phone] Pager: [phone]  04/09/2015, 8:43 AM

## 2015-04-13 ENCOUNTER — Ambulatory Visit: Payer: Medicare Other

## 2015-04-16 ENCOUNTER — Encounter: Payer: Medicare Other | Admitting: Surgery

## 2015-04-19 ENCOUNTER — Ambulatory Visit (HOSPITAL_COMMUNITY)
Admission: RE | Admit: 2015-04-19 | Discharge: 2015-04-19 | Disposition: A | Discharge status: Home / Self Care | Payer: Medicare Other | Source: Ambulatory Visit | Attending: Vascular Surgery | Admitting: Vascular Surgery

## 2015-04-19 ENCOUNTER — Encounter (HOSPITAL_COMMUNITY)
Admission: RE | Disposition: A | Discharge status: Home / Self Care | Payer: Self-pay | Source: Ambulatory Visit | Attending: Vascular Surgery

## 2015-04-19 DIAGNOSIS — I7025 Atherosclerosis of native arteries of other extremities with ulceration: Secondary | ICD-10-CM | POA: Diagnosis present

## 2015-04-19 DIAGNOSIS — E785 Hyperlipidemia, unspecified: Secondary | ICD-10-CM | POA: Insufficient documentation

## 2015-04-19 DIAGNOSIS — I110 Hypertensive heart disease with heart failure: Secondary | ICD-10-CM | POA: Diagnosis not present

## 2015-04-19 DIAGNOSIS — M1990 Unspecified osteoarthritis, unspecified site: Secondary | ICD-10-CM | POA: Diagnosis not present

## 2015-04-19 DIAGNOSIS — I509 Heart failure, unspecified: Secondary | ICD-10-CM | POA: Insufficient documentation

## 2015-04-19 DIAGNOSIS — M810 Age-related osteoporosis without current pathological fracture: Secondary | ICD-10-CM | POA: Insufficient documentation

## 2015-04-19 DIAGNOSIS — I4891 Unspecified atrial fibrillation: Secondary | ICD-10-CM | POA: Diagnosis not present

## 2015-04-19 DIAGNOSIS — L97519 Non-pressure chronic ulcer of other part of right foot with unspecified severity: Secondary | ICD-10-CM | POA: Insufficient documentation

## 2015-04-19 DIAGNOSIS — Z7982 Long term (current) use of aspirin: Secondary | ICD-10-CM | POA: Diagnosis not present

## 2015-04-19 DIAGNOSIS — Z950 Presence of cardiac pacemaker: Secondary | ICD-10-CM | POA: Diagnosis not present

## 2015-04-19 DIAGNOSIS — I70235 Atherosclerosis of native arteries of right leg with ulceration of other part of foot: Secondary | ICD-10-CM | POA: Insufficient documentation

## 2015-04-19 DIAGNOSIS — E039 Hypothyroidism, unspecified: Secondary | ICD-10-CM | POA: Diagnosis not present

## 2015-04-19 HISTORY — PX: PERIPHERAL VASCULAR CATHETERIZATION: SHX172C

## 2015-04-19 LAB — POCT I-STAT, CHEM 8
BUN: 35 mg/dL — AB (ref 6–20)
CALCIUM ION: 1.11 mmol/L — AB (ref 1.13–1.30)
CHLORIDE: 98 mmol/L — AB (ref 101–111)
Creatinine, Ser: 1.1 mg/dL — ABNORMAL HIGH (ref 0.44–1.00)
GLUCOSE: 79 mg/dL (ref 65–99)
HCT: 48 % — ABNORMAL HIGH (ref 36.0–46.0)
Hemoglobin: 16.3 g/dL — ABNORMAL HIGH (ref 12.0–15.0)
POTASSIUM: 3.9 mmol/L (ref 3.5–5.1)
Sodium: 141 mmol/L (ref 135–145)
TCO2: 33 mmol/L (ref 0–100)

## 2015-04-19 SURGERY — ABDOMINAL AORTOGRAM
Anesthesia: LOCAL

## 2015-04-19 MED ORDER — IODIXANOL 320 MG/ML IV SOLN
INTRAVENOUS | Status: DC | PRN
  Administered 2015-04-19: 85 mL via INTRA_ARTERIAL

## 2015-04-19 MED ORDER — SODIUM CHLORIDE 0.9 % IV SOLN: 1.0000 mL/kg/h | INTRAVENOUS | Status: DC

## 2015-04-19 MED ORDER — SODIUM CHLORIDE 0.9 % IV SOLN
INTRAVENOUS | Status: DC
  Administered 2015-04-19: 11:00:00 via INTRAVENOUS

## 2015-04-19 MED ORDER — HEPARIN (PORCINE) IN NACL 2-0.9 UNIT/ML-% IJ SOLN
INTRAMUSCULAR | Status: AC
  Filled 2015-04-19: qty 1000

## 2015-04-19 MED ORDER — LIDOCAINE HCL (PF) 1 % IJ SOLN
INTRAMUSCULAR | Status: AC
  Filled 2015-04-19: qty 30

## 2015-04-19 MED ORDER — LIDOCAINE HCL (PF) 1 % IJ SOLN
INTRAMUSCULAR | Status: DC | PRN
  Administered 2015-04-19: 8 mL

## 2015-04-19 MED ORDER — HYDRALAZINE HCL 20 MG/ML IJ SOLN: 10.0000 mg | INTRAMUSCULAR | Status: DC | PRN

## 2015-04-19 MED ORDER — HEPARIN (PORCINE) IN NACL 2-0.9 UNIT/ML-% IJ SOLN
INTRAMUSCULAR | Status: DC | PRN
  Administered 2015-04-19: 1000 mL via INTRA_ARTERIAL

## 2015-04-19 MED ORDER — ACETAMINOPHEN 325 MG PO TABS
650.0000 mg | ORAL_TABLET | ORAL | Status: DC | PRN
Start: 2015-04-19 — End: 2015-04-19

## 2015-04-19 SURGICAL SUPPLY — 10 items
CATH OMNI FLUSH 5F 65CM (CATHETERS) ×4 IMPLANT
CATH SOFT-VU 4F 65 STRAIGHT (CATHETERS) ×1 IMPLANT
CATH SOFT-VU STRAIGHT 4F 65CM (CATHETERS) ×1
KIT MICROINTRODUCER STIFF 5F (SHEATH) ×2 IMPLANT
KIT PV (KITS) ×2 IMPLANT
SHEATH PINNACLE 5F 10CM (SHEATH) ×2 IMPLANT
SYR MEDRAD MARK V 150ML (SYRINGE) ×2 IMPLANT
TRANSDUCER W/STOPCOCK (MISCELLANEOUS) ×2 IMPLANT
TRAY PV CATH (CUSTOM PROCEDURE TRAY) ×2 IMPLANT
WIRE BENTSON .035X145CM (WIRE) ×2 IMPLANT

## 2015-04-19 NOTE — Op Note (Signed)
OPERATIVE NOTE   PROCEDURE: 1.  Left common femoral artery cannulation under ultrasound guidance 2.  Placement of catheter in aorta 3.  Aortogram 4.  First order arterial selection 5.  Right leg runoff via catheter 6.  Left leg runoff via sheath  PRE-OPERATIVE DIAGNOSIS: right toe ulcer  POST-OPERATIVE DIAGNOSIS: same as above   SURGEON: Leonides Sake, MD  ANESTHESIA: conscious sedation  ESTIMATED BLOOD LOSS: 30 cc  CONTRAST: 80 cc  FINDING(S):  Aorta: patent, calcified  Superior mesenteric artery: patent distally, not fully evaluation Celiac artery: not seen   Right Left  RA Patent, no nephrogram Patent, no nephrogram  CIA Patent, calcified Patent, calcified  EIA Patent, calcified Patent, calcified  IIA Patent Patent  CFA Patent, calcified Patent, calcified  SFA Patent proximal, occludes distally for short segment, calcified Patent, calcified, diffuse disease  PFA Patent, extensive collaterals Patent  Pop Reconstitutes via collaterals Patent, 90% stenosis in distal segment  Trif Occluded Patent  AT Patent proximal, occludes shortly after takeoff Occluded  Pero Occluded proximally, reconstitutes in proximal segment via collaterals Patent, proximal stenosis >90%  PT Occluded Occluded   SPECIMEN(S):  none  INDICATIONS:   [name] is a 82 y.o. female who presents with right toe ulcer.  The patient presents for: aortogram, bilateral leg runoff, and possible right leg intervention.  I discussed with the patient the nature of angiographic procedures, especially the limited patencies of any endovascular intervention.  The patient is aware of that the risks of an angiographic procedure include but are not limited to: bleeding, infection, access site complications, renal failure, embolization, rupture of vessel, dissection, possible need for emergent surgical intervention, possible need for surgical procedures to treat the patient's pathology, and stroke and death.   The patient is aware of the risks and agrees to proceed.  DESCRIPTION: After full informed consent was obtained from the patient, the patient was brought back to the angiography suite.  The patient was placed supine upon the angiography table and connected to cardiopulmonary monitoring equipment.  The patient was then given conscious sedation, the amounts of which are documented in the patient's chart.  The patient was prepped and drape in the standard fashion for an angiographic procedure.  At this point, attention was turned to the left groin. Under ultrasound guidance, the subcutaneous tissue surrounding the left common femoral artery was anesthesized with 1% lidocaine with epinephrine.  The artery was then cannulated with a micropuncture needle.  The microwire was advanced into the iliac arterial system.  The needle was exchanged for a microsheath, which was loaded into the common femoral artery over the wire.  The microwire was exchanged for a [facility] wire which was advanced into the aorta.  The microsheath was then exchanged for a 5-Fr sheath which was loaded into the common femoral artery.  The Omniflush catheter was then loaded over the wire up to the level of L1.  The catheter was connected to the power injector circuit.  After de-airring and de-clotting the circuit, a power injector aortogram was completed.  The findings are listed above.  The [facility] wire was replaced in the catheter, and using the Bentson and Omniflush catheter, the right common iliac artery was selected.  The wire advanced into the right common femoral artery but the cahteter would not advanced due to the acute angle and heavy iliac artery calcification.  I exchanged the catheter for a straight catheter.  I was only able to advance the catheter into the proximal common  iliac artery.  An automated right leg runoff was completed after de-airring and de-clotting the circuit.  The findings are as listed above.  The Bentson wire was  replaced in the catheter to straighten the crook of the catheter.  Both were removed together from the sheath.  The left sheath was aspirated and no clot was present.  The sheath was connected to the power injector circuit.  An automated left leg runoff was completed after de-airring and de-clotting the circuit.  The findings are as listed above.  The sheath was aspirated.  No clots were present and the sheath was reloaded with heparinized saline.    Based on the images, this patient needs: maximal medical management.     COMPLICATIONS: none  CONDITION: stable   Leonides SakeBrian Blessing Ozga, MD [facility] [place] Office: [phone] Pager: [phone]  04/19/2015, 2:54 PM

## 2015-04-19 NOTE — Interval H&P Note (Signed)
[facility]  History and Physical Update  The patient was interviewed and re-examined.  The patient's previous History and Physical has been reviewed and is unchanged from my consult .  There is no change in the plan of care: aortogram, right leg runoff, and possible intervention.  Leonides SakeBrian Chen, MD [facility] [place] Office: [phone] Pager: [phone]  04/19/2015, 10:08 AM

## 2015-04-19 NOTE — H&P (View-Only) (Signed)
  Referred by:  Mohan Harwani, MD [address] [place], [place] [address]  Reason for referral: right great toe ulcer  History of Present Illness  [name] is a 82 y.o. (11/01/1918) female who presents with chief complaint: right great toe ulcer.  Onset of symptom occurred December 2016.  She is unclear the etiology of this ulcer.  She has pain in the ulcer that is described as sharp, severity moderate-severe, and associated with rest and manipulating the wound.  The patient also notes some nocturnal rest pain.  Patient has attempted to treat this pain with wound care.  Wound care has had no progress, so she was referred to us for evaluation.  She denies any fever or chills or drainage from the ulcer.  Atherosclerotic risk factors include: HTN, HLD.  Patient has significant cardiac history with afib, CHF, sick sinus syndrome and reported severe AS.  Past Medical History  Diagnosis Date  . Pacemaker   . A-fib (HCC)   . Hypertension   . Hypothyroidism   . Hyperlipemia   . CHF (congestive heart failure) (HCC)   . Osteoarthritis   . Osteoporosis   . Aortic stenosis, severe   . Sick sinus syndrome (HCC)   . GI bleed   . Hip fracture (HCC)   . Tachypnea     Past Surgical History  Procedure Laterality Date  . Humerus fracture surgery    . Hip arthroplasty  08/11/2011    Procedure: ARTHROPLASTY BIPOLAR HIP;  Surgeon: Matthew D Olin, MD;  Location: [facility] OR;  Service: Orthopedics;  Laterality: Left;    Social History   Social History  . Marital Status: Single    Spouse Name: N/A  . Number of Children: N/A  . Years of Education: N/A   Occupational History  . Not on file.   Social History Main Topics  . Smoking status: Never Smoker   . Smokeless tobacco: Never Used  . Alcohol Use: 0.0 oz/week     Comment: once a week, on Sunday  . Drug Use: No  . Sexual Activity: Not on file   Other Topics Concern  . Not on file   Social History Narrative     Family History  Problem Relation Age of Onset  . Bipolar disorder Mother     Current Outpatient Prescriptions  Medication Sig Dispense Refill  . acetaminophen (TYLENOL) 650 MG CR tablet Take 650 mg by mouth at bedtime. Take 2 tablets by mouth at bedtime daily    . amiodarone (PACERONE) 200 MG tablet Take 200 mg by mouth daily.    . aspirin 81 MG tablet Take 81 mg by mouth daily.    . digoxin (LANOXIN) 0.125 MG tablet Take 0.0625 mg by mouth daily.    . Docusate Sodium (COLACE PO) Take 1 tablet by mouth daily.    . doxycycline (VIBRA-TABS) 100 MG tablet Take 1 tablet (100 mg total) by mouth 2 (two) times daily. 20 tablet 0  . furosemide (LASIX) 40 MG tablet Take 40 mg by mouth. Takes 1/2 tablet in am    . levothyroxine (SYNTHROID, LEVOTHROID) 100 MCG tablet Take 100 mcg by mouth daily. Takes 1.5 tabs on sundays only Takes 1 tab all other days    . magnesium 30 MG tablet Take 10 mg by mouth daily.    . metoprolol succinate (TOPROL-XL) 25 MG 24 hr tablet Take 25 mg by mouth daily.    . Sennosides (SENNA LAXATIVE PO) Take 1 tablet   by mouth daily.    . traZODone (DESYREL) 50 MG tablet Take 25 mg by mouth at bedtime.     Current Facility-Administered Medications  Medication Dose Route Frequency Provider Last Rate Last Dose  . denosumab (PROLIA) injection 60 mg  60 mg Subcutaneous Once Ajay Kumar, MD         No Known Allergies   REVIEW OF SYSTEMS:  (Positives checked otherwise negative)  CARDIOVASCULAR:   [ ] chest pain,  [ ] chest pressure,  [x] palpitations,  [ ] shortness of breath when laying flat,  [ ] shortness of breath with exertion,   [x] pain in feet when walking,  [x] pain in feet when laying flat, [ ] history of blood clot in veins (DVT),  [ ] history of phlebitis,  [ ] swelling in legs,  [ ] varicose veins  PULMONARY:   [ ] productive cough,  [ ] asthma,  [ ] wheezing  NEUROLOGIC:   [x] weakness in arms or legs,  [ ] numbness in arms or legs,  [ ]  difficulty speaking or slurred speech,  [ ] temporary loss of vision in one eye,  [ ] dizziness  HEMATOLOGIC:   [ ] bleeding problems,  [ ] problems with blood clotting too easily  MUSCULOSKEL:   [ ] joint pain, [ ] joint swelling  GASTROINTEST:   [ ] vomiting blood,  [ ] blood in stool     GENITOURINARY:   [ ] burning with urination,  [ ] blood in urine  PSYCHIATRIC:   [ ] history of major depression  INTEGUMENTARY:   [ ] rashes,  [x] ulcers  CONSTITUTIONAL:   [ ] fever,  [ ] chills   For VQI Use Only  PRE-ADM LIVING: Nursing home  AMB STATUS: Ambulatory with Assistance  CAD Sx: None  PRIOR CHF: Mild  STRESS TEST: [c] No, [ ] Normal, [ ] + ischemia, [ ] + MI, [ ] Both   Physical Examination  Filed Vitals:   04/09/15 1318  BP: 134/70  Pulse: 60  Temp: 97 F (36.1 C)  TempSrc: Oral  Height: 5' 2" (1.575 m)  Weight: 102 lb (46.267 kg)  SpO2: 96%   Body mass index is 18.65 kg/(m^2).  General: A&O x 3, WD, elderly, Cachectic,   Head: Novelty/AT  Ear/Nose/Throat: Hearing grossly intact, nares w/o erythema or drainage, oropharynx w/o Erythema/Exudate, Mallampati score: 3, cachectic temporalis muscles  Eyes: PERRLA, EOMI  Neck: Supple, nuchal rigidity, palpable LAD  Pulmonary: Sym exp, good air movt, CTAB, no rales, rhonchi, & wheezing  Cardiac: irr, irr rate and rhythm; Nl S1, S2, no rubs or gallops, +SEM  Vascular: Vessel Right Left  Radial Faintly Palpable Faintly Palpable  Brachial Palpable Palpable  Carotid Palpable, without bruit Palpable, without bruit  Aorta Not palpable N/A  Femoral Palpable Palpable  Popliteal Not palpable Not palpable  PT Not Palpable Not Palpable  DP Not Palpable Not Palpable   Gastrointestinal: soft, NTND, -G/R, - HSM, - masses, - CVAT B  Musculoskeletal: M/S 5/5 throughout , B cyanotic toes, R foot with dependent rubor, LLE > RLE LDS, varicosities and spider veins B, cool feet (R>L), clean ulcer on medial aspect  of R great toe, no drainage, no erythema  Neurologic: CN 2-12 intact , Pain and light touch intact in extremities , Motor exam as listed above  Psychiatric: Judgment intact, Mood & affect appropriate for pt's clinical situation  Dermatologic: See M/S exam for extremity   exam, no rashes otherwise noted  Lymph : No Cervical, Axillary, or Inguinal lymphadenopathy    Non-Invasive Vascular Imaging  ABI (Date: 03/26/15)  R: 0.60, DP: not detected, PT: not detected, TBI: 0.15  L: 0.91, DP: dampened mono, PT: dampened mono, TBI: 0.35  BLE arterial duplex (Date: 04/02/15)  R: Biphasic CFA, PFA, and proximal SFA, rest of arteries mono  L: biphasic CFA, PFA, and proximal SFA, rest of arteries mono except AT which is not detected   Medical Decision Making  [name] is a 82 y.o. female who presents with: BLE critical limb ischemia, non-healing R great toe ulcer, severe AS, CHF, afib, SSS   This patient is likely very high risk for any open surgical procedure, so she would not be a candidate for such.  I discussed with the patient the natural history of critical limb ischemia: 25% require amputation in one year, 50% are able to maintain their limbs in one year, and 25-30% die in one year due to comorbidities.  Given the limb threatening status of this patient, I recommend an aggressive work up including proceeding with an: Aortogram, Right leg runoff and possible intervention. I discussed with the patient the nature of angiographic procedures, especially the limited patencies of any endovascular intervention. The patient is aware of that the risks of an angiographic procedure include but are not limited to: bleeding, infection, access site complications, embolization, rupture of treated vessel, dissection, possible need for emergent surgical intervention, and possible need for surgical procedures to treat the patient's pathology. The patient is aware of the risks and agrees to proceed.   The procedure is scheduled for: 23 MAR 17.  I discussed in depth with the patient the nature of atherosclerosis, and emphasized the importance of maximal medical management including strict control of blood pressure, blood glucose, and lipid levels, antiplatelet agents, obtaining regular exercise, and cessation of smoking.  The patient is aware that without maximal medical management the underlying atherosclerotic disease process will progress, limiting the benefit of any interventions. The patient is currently not on a statin:  Not indicated. The patient is currently on an anti-platelet: ASA.  Thank you for allowing us to participate in this patient's care.   Brian Chen, MD [facility] Office: [phone] Pager: [phone]  04/09/2015, 8:43 AM     

## 2015-04-19 NOTE — Discharge Instructions (Signed)

## 2015-04-20 ENCOUNTER — Encounter (HOSPITAL_COMMUNITY): Payer: Self-pay | Admitting: Vascular Surgery

## 2015-04-20 DIAGNOSIS — I70235 Atherosclerosis of native arteries of right leg with ulceration of other part of foot: Secondary | ICD-10-CM | POA: Diagnosis not present

## 2015-04-21 ENCOUNTER — Telehealth: Payer: Self-pay | Admitting: Vascular Surgery

## 2015-04-21 NOTE — Telephone Encounter (Signed)
-----   Message from Sharee PimpleMarilyn K McChesney, RN sent at 04/19/2015  4:33 PM EDT ----- Regarding: schedule   ----- Message -----    From: Fransisco HertzBrian L Chen, MD    Sent: 04/19/2015   3:01 PM      To: [id number] Charge Pool  Crystal AliasCarmelita J Henson [id number] 1918-09-18  PROCEDURE: 1.  Left common femoral artery cannulation under ultrasound guidance 2.  Placement of catheter in aorta 3.  Aortogram 4.  First order arterial selection 5.  Right leg runoff via catheter 6.  Left leg runoff via sheath  Follow-up: 4 weeks

## 2015-05-12 ENCOUNTER — Encounter: Payer: Self-pay | Admitting: Vascular Surgery

## 2015-05-17 ENCOUNTER — Ambulatory Visit: Payer: Medicare Other | Admitting: Surgery

## 2015-05-19 ENCOUNTER — Encounter: Payer: Self-pay | Admitting: Vascular Surgery

## 2015-05-19 ENCOUNTER — Ambulatory Visit (INDEPENDENT_AMBULATORY_CARE_PROVIDER_SITE_OTHER): Payer: Medicare Other | Admitting: Vascular Surgery

## 2015-05-19 VITALS — BP 124/68 | HR 59 | Temp 97.3°F | Resp 20 | Ht 63.0 in | Wt 102.9 lb

## 2015-05-19 DIAGNOSIS — I7025 Atherosclerosis of native arteries of other extremities with ulceration: Secondary | ICD-10-CM

## 2015-05-19 NOTE — Progress Notes (Signed)
Established Critical Limb Ischemia Patient  History of Present Illness  [name] is a 82 y.o. (06-09-1918) female who presents with chief complaint: right leg pain.  The patient has intermittent rest pain and wounds include: great toe ulcer.  The patient notes symptoms have not progressed.  The patient's treatment regimen currently included: maximal medical management and local wound care.  Pt's rest pain has improved with limited ambulation.  Pt recent underwent a R leg runoff (04/19/15) which essentially demonstrated no revascularization options.   The patient's PMH, PSH, SH, and FamHx are unchanged from 04/19/15.  Current Outpatient Prescriptions  Medication Sig Dispense Refill  . acetaminophen (TYLENOL) 650 MG CR tablet Take 650 mg by mouth at bedtime. Take 2 tablets by mouth at bedtime daily    . amiodarone (PACERONE) 200 MG tablet Take 200 mg by mouth daily.    Marland Kitchen. aspirin 81 MG tablet Take 81 mg by mouth daily.    Marland Kitchen. denosumab (PROLIA) 60 MG/ML SOLN injection Inject 60 mg into the skin every 6 (six) months. Administer in upper arm, thigh, or abdomen    . digoxin (LANOXIN) 0.125 MG tablet Take 0.0625 mg by mouth daily.    . furosemide (LASIX) 40 MG tablet Take 40 mg by mouth. Takes 1/2 tablet in am    . levothyroxine (SYNTHROID, LEVOTHROID) 100 MCG tablet Take 100 mcg by mouth daily. Takes 1.5 tabs on sundays only Takes 1 tab all other days    . metoprolol succinate (TOPROL-XL) 25 MG 24 hr tablet Take 25 mg by mouth daily.    . traZODone (DESYREL) 50 MG tablet Take 50 mg by mouth at bedtime.     [name]. Docusate Sodium (COLACE PO) Take 1 tablet by mouth daily. Reported on 05/19/2015    . magnesium 30 MG tablet Take 10 mg by mouth daily. Reported on 05/19/2015    . Sennosides (SENNA LAXATIVE PO) Take 1 tablet by mouth daily. Reported on 05/19/2015     Current Facility-Administered Medications  Medication Dose Route Frequency Provider Last Rate Last Dose  . denosumab (PROLIA)  injection 60 mg  60 mg Subcutaneous Once Reather LittlerAjay Kumar, MD        No Known Allergies  On ROS today: rest pain, no fever or chills   Physical Examination  Filed Vitals:   05/19/15 1205 05/19/15 1209  BP: 145/71 124/68  Pulse: 59   Temp: 97.3 F (36.3 C)   TempSrc: Oral   Resp: 20   Height: 5\' 3"  (1.6 m)   Weight: 102 lb 14.4 oz (46.675 kg)    Body mass index is 18.23 kg/(m^2).  General: A&O x 3, WD, Cachectic   Eyes: PERRLA, EOMI  Pulmonary: Sym exp, good air movt, CTAB, no rales, rhonchi, & wheezing  Cardiac: RRR, Nl S1, S2, no Murmurs, rubs or gallops  Vascular: Vessel Right Left  Radial Palpable Palpable  Brachial Palpable Palpable  Carotid Palpable, without bruit Palpable, without bruit  Aorta Not palpable N/A  Femoral Palpable Palpable  Popliteal Not palpable Not palpable  PT Not Palpable Not Palpable  DP Not Palpable Not Palpable   Gastrointestinal: soft, NTND, no G/R, no HSM, no masses, no CVAT B  Musculoskeletal: M/S 5/5 throughout , Extremities without ischemic changes except dry and contracted ulcer R great toe, bilateral LDS, varicosities and spider veins unchanged, macerated skin between R 3rd/4th interspace toe   Neurologic: Pain and light touch intact in extremities , Motor exam as listed above   Medical  Decision Making  [name] is a 82 y.o. female who presents with: BLE critical limb ischemia with R great toe ulcer.   The patient is getting some improvement with increased activity.  Her wounds appear better than last time with any signs of wet gangrene or infection.  At this time, I would continued with maximal medical mgmt: pain rx as needed, abx as need, and continue wound care.  If she develops severe rest pain or worsening wounds, I would offer her a palliative R AKA if she is will to proceed.  Thank you for allowing [place] to participate in this patient's care.   Leonides Sake, MD [facility] Office: [phone] Pager: [phone]  05/19/2015, 12:51 PM

## 2015-06-14 LAB — CUP PACEART REMOTE DEVICE CHECK
Brady Statistic AP VP Percent: 88 %
Brady Statistic AP VS Percent: 5 %
Brady Statistic AS VP Percent: 5 %
Brady Statistic AS VS Percent: 1.7 %
Implantable Lead Implant Date: 20100817
Implantable Lead Location: 753860
Lead Channel Impedance Value: 380 Ohm
Lead Channel Pacing Threshold Amplitude: 1.75 V
Lead Channel Pacing Threshold Pulse Width: 0.5 ms
Lead Channel Sensing Intrinsic Amplitude: 10.8 mV
Lead Channel Setting Pacing Amplitude: 1.625
Lead Channel Setting Pacing Amplitude: 2 V
Lead Channel Setting Pacing Pulse Width: 0.5 ms
MDC IDC LEAD IMPLANT DT: 20100817
MDC IDC LEAD LOCATION: 753859
MDC IDC MSMT BATTERY REMAINING LONGEVITY: 53 mo
MDC IDC MSMT BATTERY REMAINING PERCENTAGE: 51 %
MDC IDC MSMT BATTERY VOLTAGE: 2.87 V
MDC IDC MSMT LEADCHNL RA IMPEDANCE VALUE: 400 Ohm
MDC IDC MSMT LEADCHNL RA PACING THRESHOLD AMPLITUDE: 0.625 V
MDC IDC MSMT LEADCHNL RA PACING THRESHOLD PULSEWIDTH: 0.5 ms
MDC IDC MSMT LEADCHNL RA SENSING INTR AMPL: 5 mV
MDC IDC SESS DTM: 20170207142711
MDC IDC SET LEADCHNL RV SENSING SENSITIVITY: 0.5 mV
MDC IDC STAT BRADY RA PERCENT PACED: 20 %
MDC IDC STAT BRADY RV PERCENT PACED: 95 %
Pulse Gen Model: 2210
Pulse Gen Serial Number: 7062185

## 2015-06-24 ENCOUNTER — Other Ambulatory Visit (INDEPENDENT_AMBULATORY_CARE_PROVIDER_SITE_OTHER): Payer: Medicare Other

## 2015-06-24 DIAGNOSIS — M8580 Other specified disorders of bone density and structure, unspecified site: Secondary | ICD-10-CM | POA: Diagnosis not present

## 2015-06-24 DIAGNOSIS — E038 Other specified hypothyroidism: Secondary | ICD-10-CM

## 2015-06-24 DIAGNOSIS — E063 Autoimmune thyroiditis: Secondary | ICD-10-CM

## 2015-06-24 LAB — COMPREHENSIVE METABOLIC PANEL
ALK PHOS: 66 U/L (ref 39–117)
ALT: 21 U/L (ref 0–35)
AST: 25 U/L (ref 0–37)
Albumin: 4.2 g/dL (ref 3.5–5.2)
BUN: 24 mg/dL — AB (ref 6–23)
CHLORIDE: 102 meq/L (ref 96–112)
CO2: 29 mEq/L (ref 19–32)
Calcium: 9.5 mg/dL (ref 8.4–10.5)
Creatinine, Ser: 1.16 mg/dL (ref 0.40–1.20)
GFR: 46.17 mL/min — AB (ref >60.00)
GLUCOSE: 103 mg/dL — AB (ref 70–99)
POTASSIUM: 4.3 meq/L (ref 3.5–5.1)
SODIUM: 140 meq/L (ref 135–145)
TOTAL PROTEIN: 7 g/dL (ref 6.0–8.3)
Total Bilirubin: 0.4 mg/dL (ref 0.2–1.2)

## 2015-06-24 LAB — T4, FREE: FREE T4: 1.82 ng/dL — AB (ref 0.60–1.60)

## 2015-06-24 LAB — TSH: TSH: 1.58 u[IU]/mL (ref 0.35–4.50)

## 2015-06-29 ENCOUNTER — Ambulatory Visit (INDEPENDENT_AMBULATORY_CARE_PROVIDER_SITE_OTHER): Payer: Medicare Other | Admitting: Endocrinology

## 2015-06-29 ENCOUNTER — Encounter: Payer: Self-pay | Admitting: Endocrinology

## 2015-06-29 ENCOUNTER — Telehealth: Payer: Self-pay | Admitting: Endocrinology

## 2015-06-29 VITALS — BP 128/76 | HR 68 | Temp 98.1°F | Resp 14 | Ht 60.0 in | Wt 106.8 lb

## 2015-06-29 DIAGNOSIS — E559 Vitamin D deficiency, unspecified: Secondary | ICD-10-CM | POA: Diagnosis not present

## 2015-06-29 DIAGNOSIS — I7025 Atherosclerosis of native arteries of other extremities with ulceration: Secondary | ICD-10-CM

## 2015-06-29 DIAGNOSIS — M8580 Other specified disorders of bone density and structure, unspecified site: Secondary | ICD-10-CM

## 2015-06-29 DIAGNOSIS — E038 Other specified hypothyroidism: Secondary | ICD-10-CM | POA: Diagnosis not present

## 2015-06-29 DIAGNOSIS — E063 Autoimmune thyroiditis: Secondary | ICD-10-CM

## 2015-06-29 NOTE — Telephone Encounter (Signed)
I have electronically submitted pt's info for Prolia insurance verification and will notify you once I have a response. Thank you. °

## 2015-06-29 NOTE — Patient Instructions (Addendum)
Take 1 thyroid pill daily  Same Vitamin D

## 2015-06-29 NOTE — Progress Notes (Signed)
Patient ID: [name], female   DOB: [birth year], 82 y.o.   MRN: [id number]           Chief complaint: Osteoporosis and low thyroid, follow-up visit  History of Present Illness:  PROBLEM 1:  She has a history of low trauma fracture and at least a 3 inch height loss  Hip fractures in 1990, 2013; femur, traumatic ?  2011; clavicle 05/2014, she had 3 compression vertebral fractures in 2012 secondary to a fall  She had a screening bone density done and this showed the following T-scores: Femoral neck: In 11/2009 was -2.3 and total left hip was -1.7 Spine: Non-measurable Forearm measured in 2014 showed T score -1.6  Previous treatment: Prolia, possibly got 2 injections in 2014 and may have had one in 2015, records not available Fosamax: Difficult to comply with this and did not continue Calcium supplements: Calmag Vitamin D supplements: D3, 5000 units 3 times a week now, her last level was 78   She has been on Prolia recently and got her last injection in 09/2014  Calcium and renal function has been sta  PROBLEM 2: HYPOTHYROIDISM  Patient is not a good historian but she thinks she was told to have hypothyroidism 30-40 years ago, this was not postpartum She thinks she was having symptoms of fatigue before she was started on thyroid hormones and she felt better with thyroid supplementation  In 2015 at [facility] she was told to take an extra half tablet of her Synthroid 100 g daily She has not had any unusual fatigue and her TSH is again consistently normal   Lab Results  Component Value Date   TSH 1.58 06/24/2015   TSH 2.81 12/28/2014   TSH 1.65 09/09/2014   FREET4 1.82* 06/24/2015   FREET4 1.91* 12/28/2014   FREET4 1.56 09/09/2014      Past Medical History  Diagnosis Date  . Pacemaker   . A-fib (HCC)   . Hypertension   . Hypothyroidism   . Hyperlipemia   . CHF (congestive heart failure) (HCC)   . Osteoarthritis   . Osteoporosis   . Aortic  stenosis, severe   . Sick sinus syndrome (HCC)   . GI bleed   . Hip fracture (HCC)   . Tachypnea     Past Surgical History  Procedure Laterality Date  . Humerus fracture surgery    . Hip arthroplasty  08/11/2011    Procedure: ARTHROPLASTY BIPOLAR HIP;  Surgeon: Shelda Pal, MD;  Location: [facility] OR;  Service: Orthopedics;  Laterality: Left;  . Peripheral vascular catheterization N/A 04/19/2015    Procedure: Abdominal Aortogram;  Surgeon: Fransisco Hertz, MD;  Location[facility] INVASIVE CV LAB;  Service: Cardiovascular;  Laterality: N/A;    Family History  Problem Relation Age of Onset  . Bipolar disorder Mother     Social History:  reports that she has never smoked. She has never used smokeless tobacco. She reports that she drinks alcohol. She reports that she does not use illicit drugs.  Allergies: No Known Allergies    Medication List       This list is accurate as of: 06/29/15 10:55 AM.  Always use your most recent med list.               acetaminophen 650 MG CR tablet  Commonly known as:  TYLENOL  Take 650 mg by mouth at bedtime. Take 2 tablets by mouth in the morning and at bedtime daily  amiodarone 200 MG tablet  Commonly known as:  PACERONE  Take 200 mg by mouth daily.     aspirin 81 MG tablet  Take 81 mg by mouth daily.     COLACE PO  Take 1 tablet by mouth daily. Reported on 06/29/2015     denosumab 60 MG/ML Soln injection  Commonly known as:  PROLIA  Inject 60 mg into the skin every 6 (six) months. Administer in upper arm, thigh, or abdomen     digoxin 0.125 MG tablet  Commonly known as:  LANOXIN  Take 0.0625 mg by mouth daily.     furosemide 40 MG tablet  Commonly known as:  LASIX  Take 40 mg by mouth. Takes 1/2 tablet in am     HYDROcodone-acetaminophen 5-325 MG tablet  Commonly known as:  NORCO/VICODIN  TAKE 1/2 TO 1 TABLET TWICE A DAY AS NEEDED FOR PAIN     levothyroxine 100 MCG tablet  Commonly known as:  SYNTHROID, LEVOTHROID  Take 100 mcg by  mouth daily. Takes 1.5 tabs on sundays only Takes 1 tab all other days     magnesium 30 MG tablet  Take 10 mg by mouth daily. Reported on 06/29/2015     metoprolol succinate 25 MG 24 hr tablet  Commonly known as:  TOPROL-XL  Take 25 mg by mouth daily.     SENNA LAXATIVE PO  Take 1 tablet by mouth daily. Reported on 06/29/2015     traZODone 50 MG tablet  Commonly known as:  DESYREL  Take 50 mg by mouth at bedtime.        ROS She has had long-standing problems with atrial fibrillation, some history of CHF Has been maintained on amiodarone on and off for the last few years    PHYSICAL EXAM:  BP 128/76 mmHg  Pulse 68  Temp(Src) 98.1 F (36.7 C)  Resp 14  Ht 5' (1.524 m)  Wt 106 lb 12.8 oz (48.444 kg)  BMI 20.86 kg/m2  SpO2 97%    ASSESSMENT:   OSTEOPENIA with history of multiple fractures in various sites since about 1990 and height loss She also is prone to fractures because of history of falls Has had adequate vitamin D replacement  HYPOTHYROIDISM, likely to be autoimmune, long-standing and usually well controlled with 100 g of levothyroxine, 7-1/2 tablets a week She does have nonspecific fatigue and memory changes Her TSH is trending slightly lower and considering her age and history of atrial arrhythmias should be relatively higher   PLAN:   She does need to be on Prolia long-term and will scheduled after  insurance reauthorization Continue vitamin D3, 5000 units 3 days a week and calcium supplement, recheck level on next visit  She can skip the extra half pill of the Synthroid once a week Follow-up in 6 months  Keyri Salberg 06/29/2015, 10:55 AM

## 2015-07-01 ENCOUNTER — Encounter: Payer: Medicare Other | Admitting: *Deleted

## 2015-07-02 ENCOUNTER — Encounter: Payer: Self-pay | Admitting: Cardiology

## 2015-07-05 ENCOUNTER — Telehealth: Payer: Self-pay | Admitting: Internal Medicine

## 2015-07-05 ENCOUNTER — Ambulatory Visit (INDEPENDENT_AMBULATORY_CARE_PROVIDER_SITE_OTHER): Payer: Medicare Other | Admitting: *Deleted

## 2015-07-05 DIAGNOSIS — I495 Sick sinus syndrome: Secondary | ICD-10-CM

## 2015-07-05 NOTE — Telephone Encounter (Signed)
Spoke with Mr. [name] and notified him that her monitor is set up to send automatic transmissions but sometimes technology fails us, and we call them to send a manual transmission. He verbalizes understanding and is appreciative of the call.

## 2015-07-05 NOTE — Telephone Encounter (Signed)
New message     When the son manually downloaded the transmission, the device needs to be put on automatic data collection

## 2015-07-06 NOTE — Telephone Encounter (Signed)
I have rec'd a letter from [employer] in ref to insurance verification and [name] does not have coverage for Prolia through her medical (Part B) coverage.  Please ask her to contact her pharmacy and have them to check coverage through her prescription coverage (Part D).  If you have further questions, please let me know.  Thank you!

## 2015-07-06 NOTE — Progress Notes (Signed)
Remote pacemaker transmission.   

## 2015-07-12 LAB — CUP PACEART REMOTE DEVICE CHECK
Battery Remaining Longevity: 47 mo
Battery Remaining Percentage: 46 %
Brady Statistic AP VS Percent: 10 %
Brady Statistic AS VS Percent: 7.8 %
Brady Statistic RV Percent Paced: 89 %
Date Time Interrogation Session: 20170612154711
Implantable Lead Implant Date: 20100817
Lead Channel Impedance Value: 360 Ohm
Lead Channel Pacing Threshold Amplitude: 1.75 V
Lead Channel Pacing Threshold Pulse Width: 0.5 ms
Lead Channel Sensing Intrinsic Amplitude: 5 mV
Lead Channel Setting Pacing Amplitude: 2 V
Lead Channel Setting Pacing Pulse Width: 0.5 ms
Lead Channel Setting Sensing Sensitivity: 0.5 mV
MDC IDC LEAD IMPLANT DT: 20100817
MDC IDC LEAD LOCATION: 753859
MDC IDC LEAD LOCATION: 753860
MDC IDC MSMT BATTERY VOLTAGE: 2.86 V
MDC IDC MSMT LEADCHNL RA PACING THRESHOLD AMPLITUDE: 0.5 V
MDC IDC MSMT LEADCHNL RA PACING THRESHOLD PULSEWIDTH: 0.5 ms
MDC IDC MSMT LEADCHNL RV IMPEDANCE VALUE: 340 Ohm
MDC IDC MSMT LEADCHNL RV SENSING INTR AMPL: 6.1 mV
MDC IDC SET LEADCHNL RA PACING AMPLITUDE: 1.5 V
MDC IDC STAT BRADY AP VP PERCENT: 80 %
MDC IDC STAT BRADY AS VP PERCENT: 2.5 %
MDC IDC STAT BRADY RA PERCENT PACED: 39 %
Pulse Gen Model: 2210
Pulse Gen Serial Number: 7062185

## 2015-07-15 ENCOUNTER — Encounter: Payer: Self-pay | Admitting: Cardiology

## 2015-08-26 ENCOUNTER — Emergency Department (HOSPITAL_COMMUNITY): Payer: Medicare Other

## 2015-08-26 ENCOUNTER — Observation Stay (HOSPITAL_COMMUNITY)
Admission: EM | Admit: 2015-08-26 | Discharge: 2015-08-27 | Disposition: A | Discharge status: Home / Self Care | Payer: Medicare Other | Attending: Cardiology | Admitting: Cardiology

## 2015-08-26 ENCOUNTER — Encounter (HOSPITAL_COMMUNITY): Payer: Self-pay | Admitting: Emergency Medicine

## 2015-08-26 DIAGNOSIS — Z8673 Personal history of transient ischemic attack (TIA), and cerebral infarction without residual deficits: Secondary | ICD-10-CM | POA: Insufficient documentation

## 2015-08-26 DIAGNOSIS — I482 Chronic atrial fibrillation: Secondary | ICD-10-CM | POA: Insufficient documentation

## 2015-08-26 DIAGNOSIS — K219 Gastro-esophageal reflux disease without esophagitis: Secondary | ICD-10-CM | POA: Diagnosis not present

## 2015-08-26 DIAGNOSIS — K922 Gastrointestinal hemorrhage, unspecified: Secondary | ICD-10-CM | POA: Diagnosis not present

## 2015-08-26 DIAGNOSIS — I350 Nonrheumatic aortic (valve) stenosis: Secondary | ICD-10-CM | POA: Diagnosis not present

## 2015-08-26 DIAGNOSIS — R2681 Unsteadiness on feet: Secondary | ICD-10-CM | POA: Insufficient documentation

## 2015-08-26 DIAGNOSIS — Z66 Do not resuscitate: Secondary | ICD-10-CM | POA: Diagnosis not present

## 2015-08-26 DIAGNOSIS — D62 Acute posthemorrhagic anemia: Secondary | ICD-10-CM | POA: Insufficient documentation

## 2015-08-26 DIAGNOSIS — I110 Hypertensive heart disease with heart failure: Secondary | ICD-10-CM | POA: Insufficient documentation

## 2015-08-26 DIAGNOSIS — E038 Other specified hypothyroidism: Secondary | ICD-10-CM | POA: Insufficient documentation

## 2015-08-26 DIAGNOSIS — D649 Anemia, unspecified: Secondary | ICD-10-CM | POA: Diagnosis present

## 2015-08-26 DIAGNOSIS — I5043 Acute on chronic combined systolic (congestive) and diastolic (congestive) heart failure: Secondary | ICD-10-CM | POA: Diagnosis not present

## 2015-08-26 DIAGNOSIS — M1990 Unspecified osteoarthritis, unspecified site: Secondary | ICD-10-CM | POA: Diagnosis not present

## 2015-08-26 DIAGNOSIS — E785 Hyperlipidemia, unspecified: Secondary | ICD-10-CM | POA: Diagnosis not present

## 2015-08-26 DIAGNOSIS — Z950 Presence of cardiac pacemaker: Secondary | ICD-10-CM | POA: Diagnosis not present

## 2015-08-26 DIAGNOSIS — Z96642 Presence of left artificial hip joint: Secondary | ICD-10-CM | POA: Insufficient documentation

## 2015-08-26 DIAGNOSIS — Z7982 Long term (current) use of aspirin: Secondary | ICD-10-CM | POA: Diagnosis not present

## 2015-08-26 DIAGNOSIS — M8580 Other specified disorders of bone density and structure, unspecified site: Secondary | ICD-10-CM | POA: Diagnosis present

## 2015-08-26 DIAGNOSIS — I495 Sick sinus syndrome: Secondary | ICD-10-CM | POA: Diagnosis not present

## 2015-08-26 HISTORY — DX: Unspecified fracture of unspecified patella, initial encounter for closed fracture: S82.009A

## 2015-08-26 LAB — URINALYSIS, ROUTINE W REFLEX MICROSCOPIC
BILIRUBIN URINE: NEGATIVE
Glucose, UA: NEGATIVE mg/dL
Hgb urine dipstick: NEGATIVE
KETONES UR: NEGATIVE mg/dL
NITRITE: NEGATIVE
PH: 6.5 (ref 5.0–8.0)
PROTEIN: NEGATIVE mg/dL
Specific Gravity, Urine: 1.02 (ref 1.005–1.030)

## 2015-08-26 LAB — CBC
HCT: 25.4 % — ABNORMAL LOW (ref 36.0–46.0)
HEMOGLOBIN: 7.4 g/dL — AB (ref 12.0–15.0)
MCH: 25.7 pg — ABNORMAL LOW (ref 26.0–34.0)
MCHC: 29.1 g/dL — ABNORMAL LOW (ref 30.0–36.0)
MCV: 88.2 fL (ref 78.0–100.0)
Platelets: 393 10*3/uL (ref 150–400)
RBC: 2.88 MIL/uL — AB (ref 3.87–5.11)
RDW: 13.9 % (ref 11.5–15.5)
WBC: 9.4 10*3/uL (ref 4.0–10.5)

## 2015-08-26 LAB — CBC WITH DIFFERENTIAL/PLATELET
BASOS ABS: 0 10*3/uL (ref 0.0–0.1)
BASOS PCT: 0 %
EOS ABS: 0 10*3/uL (ref 0.0–0.7)
Eosinophils Relative: 0 %
HCT: 22.7 % — ABNORMAL LOW (ref 36.0–46.0)
Hemoglobin: 6.7 g/dL — CL (ref 12.0–15.0)
Lymphocytes Relative: 15 %
Lymphs Abs: 1.5 10*3/uL (ref 0.7–4.0)
MCH: 25.9 pg — ABNORMAL LOW (ref 26.0–34.0)
MCHC: 29.5 g/dL — AB (ref 30.0–36.0)
MCV: 87.6 fL (ref 78.0–100.0)
MONO ABS: 0.6 10*3/uL (ref 0.1–1.0)
MONOS PCT: 6 %
Neutro Abs: 7.7 10*3/uL (ref 1.7–7.7)
Neutrophils Relative %: 79 %
PLATELETS: 382 10*3/uL (ref 150–400)
RBC: 2.59 MIL/uL — ABNORMAL LOW (ref 3.87–5.11)
RDW: 14.1 % (ref 11.5–15.5)
WBC: 9.8 10*3/uL (ref 4.0–10.5)

## 2015-08-26 LAB — COMPREHENSIVE METABOLIC PANEL
ALBUMIN: 3.8 g/dL (ref 3.5–5.0)
ALT: 19 U/L (ref 14–54)
ANION GAP: 10 (ref 5–15)
AST: 23 U/L (ref 15–41)
Alkaline Phosphatase: 48 U/L (ref 38–126)
BILIRUBIN TOTAL: 0.2 mg/dL — AB (ref 0.3–1.2)
BUN: 29 mg/dL — ABNORMAL HIGH (ref 6–20)
CO2: 28 mmol/L (ref 22–32)
Calcium: 9.6 mg/dL (ref 8.9–10.3)
Chloride: 104 mmol/L (ref 101–111)
Creatinine, Ser: 1.33 mg/dL — ABNORMAL HIGH (ref 0.44–1.00)
GFR calc non Af Amer: 33 mL/min — ABNORMAL LOW (ref >60)
GFR, EST AFRICAN AMERICAN: 38 mL/min — AB (ref >60)
GLUCOSE: 79 mg/dL (ref 65–99)
POTASSIUM: 4 mmol/L (ref 3.5–5.1)
SODIUM: 142 mmol/L (ref 135–145)
TOTAL PROTEIN: 6.4 g/dL — AB (ref 6.5–8.1)

## 2015-08-26 LAB — PROTIME-INR
INR: 1.06
Prothrombin Time: 13.8 seconds (ref 11.4–15.2)

## 2015-08-26 LAB — DIFFERENTIAL
BASOS ABS: 0 10*3/uL (ref 0.0–0.1)
Basophils Relative: 0 %
EOS ABS: 0 10*3/uL (ref 0.0–0.7)
EOS PCT: 0 %
Lymphocytes Relative: 8 %
Lymphs Abs: 0.7 10*3/uL (ref 0.7–4.0)
MONO ABS: 0.8 10*3/uL (ref 0.1–1.0)
Monocytes Relative: 8 %
NEUTROS ABS: 7.9 10*3/uL — AB (ref 1.7–7.7)
NEUTROS PCT: 84 %

## 2015-08-26 LAB — HEMOGLOBIN AND HEMATOCRIT, BLOOD
HCT: 27 % — ABNORMAL LOW (ref 36.0–46.0)
Hemoglobin: 8.3 g/dL — ABNORMAL LOW (ref 12.0–15.0)

## 2015-08-26 LAB — PREPARE RBC (CROSSMATCH)

## 2015-08-26 LAB — URINE MICROSCOPIC-ADD ON: RBC / HPF: NONE SEEN RBC/hpf (ref 0–5)

## 2015-08-26 LAB — I-STAT TROPONIN, ED: Troponin i, poc: 0.01 ng/mL (ref 0.00–0.08)

## 2015-08-26 LAB — APTT: APTT: 29 s (ref 24–36)

## 2015-08-26 LAB — POC OCCULT BLOOD, ED: Fecal Occult Bld: POSITIVE — AB

## 2015-08-26 MED ORDER — SODIUM CHLORIDE 0.9 % IV SOLN: INTRAVENOUS | Status: DC

## 2015-08-26 MED ORDER — DOCUSATE SODIUM 100 MG PO CAPS
100.0000 mg | ORAL_CAPSULE | Freq: Every day | ORAL | Status: DC
  Administered 2015-08-27: 100 mg via ORAL
  Filled 2015-08-26: qty 1

## 2015-08-26 MED ORDER — DIGOXIN 125 MCG PO TABS
0.0625 mg | ORAL_TABLET | Freq: Every day | ORAL | Status: DC
  Administered 2015-08-27: 0.0625 mg via ORAL
  Filled 2015-08-26: qty 1

## 2015-08-26 MED ORDER — METOPROLOL SUCCINATE ER 25 MG PO TB24
25.0000 mg | ORAL_TABLET | Freq: Every day | ORAL | Status: DC
Start: 2015-08-27 — End: 2015-08-27
  Administered 2015-08-27: 25 mg via ORAL
  Filled 2015-08-26 (×2): qty 1

## 2015-08-26 MED ORDER — AMIODARONE HCL 200 MG PO TABS
200.0000 mg | ORAL_TABLET | Freq: Every day | ORAL | Status: DC
  Administered 2015-08-27: 200 mg via ORAL
  Filled 2015-08-26: qty 1

## 2015-08-26 MED ORDER — LEVOTHYROXINE SODIUM 100 MCG PO TABS
100.0000 ug | ORAL_TABLET | Freq: Every day | ORAL | Status: DC
  Administered 2015-08-27: 100 ug via ORAL
  Filled 2015-08-26: qty 1

## 2015-08-26 MED ORDER — DENOSUMAB 60 MG/ML ~~LOC~~ SOLN: 60.0000 mg | Freq: Once | SUBCUTANEOUS | Status: DC

## 2015-08-26 MED ORDER — FUROSEMIDE 40 MG PO TABS
40.0000 mg | ORAL_TABLET | Freq: Every day | ORAL | Status: DC
  Administered 2015-08-27: 40 mg via ORAL
  Filled 2015-08-26: qty 1

## 2015-08-26 MED ORDER — ACETAMINOPHEN ER 650 MG PO TBCR: 650.0000 mg | EXTENDED_RELEASE_TABLET | ORAL | Status: DC

## 2015-08-26 MED ORDER — ACETAMINOPHEN 325 MG PO TABS
650.0000 mg | ORAL_TABLET | Freq: Two times a day (BID) | ORAL | Status: DC
  Administered 2015-08-26 – 2015-08-27 (×2): 650 mg via ORAL
  Filled 2015-08-26 (×2): qty 2

## 2015-08-26 MED ORDER — SENNOSIDES 8.6 MG PO TABS: 1.0000 | ORAL_TABLET | Freq: Every day | ORAL | Status: DC

## 2015-08-26 MED ORDER — SODIUM CHLORIDE 0.9 % IV SOLN: Freq: Once | INTRAVENOUS | Status: DC

## 2015-08-26 MED ORDER — FERROUS SULFATE 220 (44 FE) MG/5ML PO ELIX
220.0000 mg | ORAL_SOLUTION | Freq: Two times a day (BID) | ORAL | Status: DC
  Administered 2015-08-27: 220 mg via ORAL
  Filled 2015-08-26 (×2): qty 5

## 2015-08-26 MED ORDER — SENNA 8.6 MG PO TABS
1.0000 | ORAL_TABLET | Freq: Every day | ORAL | Status: DC
  Administered 2015-08-27: 8.6 mg via ORAL
  Filled 2015-08-26: qty 1

## 2015-08-26 MED ORDER — TRAZODONE HCL 50 MG PO TABS
25.0000 mg | ORAL_TABLET | Freq: Every day | ORAL | Status: DC
  Administered 2015-08-26: 25 mg via ORAL
  Filled 2015-08-26: qty 1

## 2015-08-26 NOTE — ED Notes (Signed)
Walked with patient back from the bathroom and assisted her back into the bed.

## 2015-08-26 NOTE — ED Notes (Signed)
Placed patient on the monitor and into a gown 

## 2015-08-26 NOTE — ED Provider Notes (Signed)
[facility]-EMERGENCY DEPT Provider Note   CSN: [id number] Arrival date & time: 08/26/15  7829  First Provider Contact:  First MD Initiated Contact with Patient 08/26/15 [phone]        History   Chief Complaint Chief Complaint  Patient presents with  . Fall  . Aphasia    HPI [name] is a 82 y.o. female.  Patient is a 82 year old female with a history of severe aortic stenosis, CHF, A. fib on aspirin, prior GI bleed: Warfarin, hypertension, sick sinus syndrome and recent patellar fracture 2 months ago presenting today with 2 weeks of intermittent aphasia and balance issues with occasional falls. Initially family felt that this may be due to her recent patellar fracture however now they are concerned that it is something else. She seems to have trouble comprehending how to do certain task at times it will occasionally lean to the point of almost falling over without correction. They deny any swallowing problems before specific facial droop or unilateral symptoms. She has had no recent medication changes. She denies chest pain, shortness of breath, abdominal pain, nausea, vomiting, diarrhea. She has no urinary complaints.   The history is provided by the patient and a relative.  Fall     Past Medical History:  Diagnosis Date  . A-fib (HCC)   . Aortic stenosis, severe   . CHF (congestive heart failure) (HCC)   . GI bleed   . Hip fracture (HCC)   . Hyperlipemia   . Hypertension   . Hypothyroidism   . Osteoarthritis   . Osteoporosis   . Pacemaker   . Patella fracture    right  . Sick sinus syndrome (HCC)   . Tachypnea     Patient Active Problem List   Diagnosis Date Noted  . Atherosclerosis of native arteries of the extremities with ulceration (HCC) 04/09/2015  . Acquired autoimmune hypothyroidism 12/28/2014  . Acute lower GI bleeding 06/28/2013  . Pulmonary hypertension (HCC) 04/23/2013  . Diastolic CHF, acute on chronic (HCC) 04/23/2013  . Unspecified hypothyroidism  04/23/2013  . Tachypnea 08/15/2011  . Hypotension 08/12/2011  . Severe aortic stenosis 08/12/2011  . Hip fracture (HCC) 08/10/2011  . UTI (lower urinary tract infection) 08/10/2011  . Abdominal distention 08/10/2011  . Atrial fibrillation (HCC) 08/10/2011  . Murmur, cardiac 08/10/2011  . Atrial fibrillation with rapid ventricular response (HCC) 08/10/2011  . Chronic anticoagulation 08/10/2011    Past Surgical History:  Procedure Laterality Date  . HIP ARTHROPLASTY  08/11/2011   Procedure: ARTHROPLASTY BIPOLAR HIP;  Surgeon: Shelda Pal, MD;  Location: [facility] OR;  Service: Orthopedics;  Laterality: Left;  . HUMERUS FRACTURE SURGERY    . PERIPHERAL VASCULAR CATHETERIZATION N/A 04/19/2015   Procedure: Abdominal Aortogram;  Surgeon: Fransisco Hertz, MD;  Location: [facility] INVASIVE CV LAB;  Service: Cardiovascular;  Laterality: N/A;    OB History    No data available       Home Medications    Prior to Admission medications   Medication Sig Start Date End Date Taking? Authorizing Provider  acetaminophen (TYLENOL) 650 MG CR tablet Take 650 mg by mouth at bedtime. Take 2 tablets by mouth in the morning and at bedtime daily    Historical Provider, MD  amiodarone (PACERONE) 200 MG tablet Take 200 mg by mouth daily.    Historical Provider, MD  aspirin 81 MG tablet Take 81 mg by mouth daily.    Historical Provider, MD  denosumab (PROLIA) 60 MG/ML SOLN injection Inject 60 mg  into the skin every 6 (six) months. Administer in upper arm, thigh, or abdomen    Historical Provider, MD  digoxin (LANOXIN) 0.125 MG tablet Take 0.0625 mg by mouth daily.    Historical Provider, MD  Docusate Sodium (COLACE PO) Take 1 tablet by mouth daily. Reported on 06/29/2015    Historical Provider, MD  furosemide (LASIX) 40 MG tablet Take 40 mg by mouth. Takes 1/2 tablet in am    Historical Provider, MD  HYDROcodone-acetaminophen (NORCO/VICODIN) 5-325 MG tablet TAKE 1/2 TO 1 TABLET TWICE A DAY AS NEEDED FOR PAIN 06/16/15    Historical Provider, MD  levothyroxine (SYNTHROID, LEVOTHROID) 100 MCG tablet Take 100 mcg by mouth daily. Takes 1.5 tabs on sundays only Takes 1 tab all other days    Historical Provider, MD  magnesium 30 MG tablet Take 10 mg by mouth daily. Reported on 06/29/2015    Historical Provider, MD  metoprolol succinate (TOPROL-XL) 25 MG 24 hr tablet Take 25 mg by mouth daily.    Historical Provider, MD  Sennosides (SENNA LAXATIVE PO) Take 1 tablet by mouth daily. Reported on 06/29/2015    Historical Provider, MD  traZODone (DESYREL) 50 MG tablet Take 50 mg by mouth at bedtime.     Historical Provider, MD    Family History Family History  Problem Relation Age of Onset  . Bipolar disorder Mother     Social History Social History  Substance Use Topics  . Smoking status: Never Smoker  . Smokeless tobacco: Never Used  . Alcohol use 0.0 oz/week     Comment: once a week, on Sunday     Allergies   Review of patient's allergies indicates no known allergies.   Review of Systems Review of Systems  All other systems reviewed and are negative.    Physical Exam Updated Vital Signs BP 158/62 (BP Location: Left Arm)   Pulse 60   Temp 98.1 F (36.7 C) (Oral)   Resp 20   SpO2 100%   Physical Exam  Constitutional: She is oriented to person, place, and time. She appears well-developed and well-nourished. No distress.  HENT:  Head: Normocephalic and atraumatic.  Mouth/Throat: Oropharynx is clear and moist.  Eyes: Conjunctivae and EOM are normal. Pupils are equal, round, and reactive to light.  Neck: Normal range of motion. Neck supple.  Cardiovascular: Normal rate, regular rhythm and intact distal pulses.   Murmur heard.  Systolic murmur is present with a grade of 3/6  Pulmonary/Chest: Effort normal and breath sounds normal. No respiratory distress. She has no wheezes. She has no rales.  Abdominal: Soft. She exhibits no distension. There is no tenderness. There is no rebound and no guarding.    Musculoskeletal: Normal range of motion. She exhibits no edema or tenderness.  Right foot with mild redness but no open ulcers. Toes are cold to palpation bilaterally. Unable to palpate peripheral pulses  Neurological: She is alert and oriented to person, place, and time. No cranial nerve deficit or sensory deficit.  Preventative 5 strength in the upper and lower extremity's bilaterally. No pronator drift noted on the right or left upper and lower extremities but mild left hand tremor with testing.  Because of recent patellar fracture unable to do heel-to-shin with the right foot but she is able to do it with the left foot with only mild tremor. No visual field cuts.  Skin: Skin is dry. Capillary refill takes 2 to 3 seconds. No rash noted. No erythema.  Psychiatric: She has a normal  mood and affect. Her behavior is normal.  Nursing note and vitals reviewed.    ED Treatments / Results  Labs (all labs ordered are listed, but only abnormal results are displayed) Labs Reviewed  CBC - Abnormal; Notable for the following:       Result Value   RBC 2.88 (*)    Hemoglobin 7.4 (*)    HCT 25.4 (*)    MCH 25.7 (*)    MCHC 29.1 (*)    All other components within normal limits  DIFFERENTIAL - Abnormal; Notable for the following:    Neutro Abs 7.9 (*)    All other components within normal limits  COMPREHENSIVE METABOLIC PANEL - Abnormal; Notable for the following:    BUN 29 (*)    Creatinine, Ser 1.33 (*)    Total Protein 6.4 (*)    Total Bilirubin 0.2 (*)    GFR calc non Af Amer 33 (*)    GFR calc Af Amer 38 (*)    All other components within normal limits  URINALYSIS, ROUTINE W REFLEX MICROSCOPIC (NOT AT [facility]) - Abnormal; Notable for the following:    APPearance CLOUDY (*)    Leukocytes, UA SMALL (*)    All other components within normal limits  URINE MICROSCOPIC-ADD ON - Abnormal; Notable for the following:    Squamous Epithelial / LPF 0-5 (*)    Bacteria, UA RARE (*)    All other  components within normal limits  PROTIME-INR  APTT  I-STAT TROPOININ, ED  POC OCCULT BLOOD, ED    EKG  EKG Interpretation  Date/Time:  Thursday August 26 2015 09:31:59 EDT Ventricular Rate:  60 PR Interval:    QRS Duration: 115 QT Interval:  494 QTC Calculation: 494 R Axis:   -63 Text Interpretation:  Sinus or ectopic atrial rhythm with occasional paced beats Prolonged PR interval Incomplete right bundle branch block LVH with IVCD, LAD and secondary repol abnrm Borderline prolonged QT interval prior EKG with paced rhythm Confirmed by Anitra Lauth  MD, Alphonzo Lemmings ([id number]) on 08/26/2015 10:10:34 AM       Radiology Dg Chest 2 View  Result Date: 08/26/2015 CLINICAL DATA:  Ataxia, a phase GIA, history of hypertension, atrial fibrillation, and CHF EXAM: CHEST  2 VIEW COMPARISON:  Portable chest x-ray of Jun 21, 2014 and PA and lateral chest x-ray of April 26, 2013. FINDINGS: The lungs are well-expanded. There is no focal infiltrate. The interstitial markings are coarse in the perihilar regions but this is stable. There is a stable approximately is 5 x 6 mm calcification in the left upper lobe posteriorly. The cardiac silhouette remains enlarged. The pulmonary vascularity is not engorged. The permanent pacemaker is in stable position. There is calcification in the wall of the aortic arch. There is mild multilevel degenerative disc disease of the thoracic spine. There are approximately 50% compressions of the bodies of L1 and L2 which are unchanged since a lateral chest x-ray of April 26, 2013. IMPRESSION: 1. No acute cardiopulmonary abnormality. Underlying chronic bronchitic changes with evidence of previous granulomatous infection. 2. Cardiomegaly without pulmonary vascular congestion. Reasonable positioning of the permanent pacemaker. 3. Aortic atherosclerosis. Electronically Signed   By: David  [place] M.D.   On: 08/26/2015 10:26   Ct Head Wo Contrast  Result Date: 08/26/2015 CLINICAL DATA:  Slurred  speech.  Ataxia. EXAM: CT HEAD WITHOUT CONTRAST TECHNIQUE: Contiguous axial images were obtained from the base of the skull through the vertex without intravenous contrast. COMPARISON:  None FINDINGS: Brain: There  is moderate diffuse atrophy. There is no intracranial mass, hemorrhage, extra-axial fluid collection, or midline shift. There is small vessel disease throughout the centra semiovale bilaterally. Small vessel disease is also noted throughout much of the internal and external capsules bilaterally. There is evidence of a prior lacunar type infarct in the superior right thalamus. There is no evident acute infarct. Vascular: There is no hyperdense vessel. There is calcification in each carotid siphon region. Skull: The bony calvarium appears intact. Sinuses/Orbits: Visualized paranasal sinuses are clear. No intraorbital lesions are evident. Other: Mastoid air cells are clear. IMPRESSION: Atrophy with extensive supratentorial small vessel disease. Prior small lacunar infarct right thalamus. No acute infarct evident. No hemorrhage or mass effect. There is extensive carotid siphon region calcification bilaterally. Electronically Signed   By: Bretta Bang III M.D.   On: 08/26/2015 11:34    Procedures Procedures (including critical care time)  Medications Ordered in ED Medications - No data to display   Initial Impression / Assessment and Plan / ED Course  I have reviewed the triage vital signs and the nursing notes.  Pertinent labs & imaging results that were available during my care of the patient were reviewed by me and considered in my medical decision making (see chart for details).  Clinical Course   Patient is a 82 year old female with multiple cardiac problems including atrial fibrillation only on aspirin presenting today with a two-week history of intermittent gait difficulty and occasional controlled falls, occasional aphasia that has persisted. Family member states these things  happen on a daily basis but are not continuous. He is to have more difficulty with her right leg however she has a known history of peripheral artery disease that is unable to be intervened upon. She also recently broke her right patella which they were thinking was a problem with her right leg difficulties. However given the consistency of symptoms her physician wanted her to be evaluated for stroke versus urinary tract infection. Patient is awake alert and has no focal neurologic findings other than a mild tremor in the left hand when testing for pronator drift and with heel-to-shin on the left foot slight tremor but able to complete the task. She has decreased pulses in bilateral lower extremities more significant on the right but sensation is intact. She has had no falls where she has sustained head trauma. She denies any urinary symptoms. No cough, shortness of breath, abdominal pain, nausea or vomiting.  Stroke order set initiated also will check a UA to ensure no UTI. 2:37 PM Patient found to be anemic today with a hemoglobin of 7 from prior 16. This most likely is the cause of her balance issues with standing she is most likely getting dizzy. She is heme positive for Hemoccult however patient does suffer from hemorrhoids and that may be the cause for it to be positive. Stools are normal color. Patient is agreeable to receiving a blood transfusion and she will be admitted for further care.  CRITICAL CARE Performed by: Gwyneth Sprout Total critical care time: 30 minutes Critical care time was exclusive of separately billable procedures and treating other patients. Critical care was necessary to treat or prevent imminent or life-threatening deterioration. Critical care was time spent personally by me on the following activities: development of treatment plan with patient and/or surrogate as well as nursing, discussions with consultants, evaluation of patient's response to treatment, examination of  patient, obtaining history from patient or surrogate, ordering and performing treatments and interventions, ordering and review of laboratory  studies, ordering and review of radiographic studies, pulse oximetry and re-evaluation of patient's condition.   Final Clinical Impressions(s) / ED Diagnoses   Final diagnoses:  Anemia, unspecified anemia type    New Prescriptions New Prescriptions   No medications on file     Gwyneth Sprout, MD 08/26/15 1437

## 2015-08-26 NOTE — ED Notes (Signed)
Pt oob to br with assist. Gait steady.

## 2015-08-26 NOTE — ED Notes (Signed)
Assisted patient with bedpan, patient repositioned

## 2015-08-26 NOTE — H&P (Signed)
[name] an 82 y.o. female.   Chief Complaint: unsteady gait associated with occasional aphagia and generalized weakness 2 weeks IEP:PIRJJOA Henson 82 year old female with past medical history significant for multiple medical problems, I.e.  Severe aortic stenosis, hypertension, congestive heart failure secondary to depressed LV systolic function, chronic atrial fibrillation, hypertension, hyperlipidemia, peripheral vascular disease, osteoarthritis/osteopenia, sick sinus syndrome, history of tachybradycardia syndrome status post permanent pacemaker,history of thalamic lacunar infarct, history of diverticular bleed in the past, came to ER complaining of unsteady gait associated with occasional episodes of aphagia off-and-on for the last 2 weeks and generalized weakness.  Patient was noted to be anemic with hemoglobin of 7.3.  Repeat hemoglobin was 6.7.  Patient complains of occasional bright red blood per rectum on the sheets and states suffers from hemorrhoids.  Denies any abdominal pain.  Denies any chest pain.  Denies any weakness in the arms or legs.  Denies any seizure activity.  Patient was not treated to have heme-positive stools.  Patient not on any anticoagulation in view of history of falls and her age.  She Henson on 81 mg aspirin daily.  No history of NSAIDs use.  Past Medical History:  Diagnosis Date  . Crystal-fib ([place])   . Aortic stenosis, severe   . CHF (congestive heart failure) ([place])   . GI bleed   . Hip fracture ([place])   . Hyperlipemia   . Hypertension   . Hypothyroidism   . Osteoarthritis   . Osteoporosis   . Pacemaker   . Patella fracture    right  . Sick sinus syndrome ([place])   . Tachypnea     Past Surgical History:  Procedure Laterality Date  . HIP ARTHROPLASTY  08/11/2011   Procedure: ARTHROPLASTY BIPOLAR HIP;  Surgeon: Mauri Pole, MD;  Location: [facility];  Service: Orthopedics;  Laterality: Left;  . HUMERUS FRACTURE SURGERY    . PERIPHERAL VASCULAR CATHETERIZATION N/Crystal  04/19/2015   Procedure: Abdominal Aortogram;  Surgeon: Conrad Newington Forest, MD;  Location: [facility];  Service: Cardiovascular;  Laterality: N/Crystal;    Family History  Problem Relation Age of Onset  . Bipolar disorder Mother    Social History:  reports that she has never smoked. She has never used smokeless tobacco. She reports that she drinks alcohol. She reports that she does not use drugs.  Allergies: No Known Allergies   (Not in Crystal hospital admission)  Results for orders placed or performed during the hospital encounter of 08/26/15 (from the past 48 hour(s))  Protime-INR     Status: None   Collection Time: 08/26/15 10:05 AM  Result Value Ref Range   Prothrombin Time 13.8 11.4 - 15.2 seconds   INR 1.06   APTT     Status: None   Collection Time: 08/26/15 10:05 AM  Result Value Ref Range   aPTT 29 24 - 36 seconds  CBC     Status: Abnormal   Collection Time: 08/26/15 10:05 AM  Result Value Ref Range   WBC 9.4 4.0 - 10.5 K/uL   RBC 2.88 (L) 3.87 - 5.11 MIL/uL   Hemoglobin 7.4 (L) 12.0 - 15.0 g/dL   HCT 25.4 (L) 36.0 - 46.0 %   MCV 88.2 78.0 - 100.0 fL   MCH 25.7 (L) 26.0 - 34.0 pg   MCHC 29.1 (L) 30.0 - 36.0 g/dL   RDW 13.9 11.5 - 15.5 %   Platelets 393 150 - 400 K/uL  Differential     Status:  Abnormal   Collection Time: 08/26/15 10:05 AM  Result Value Ref Range   Neutrophils Relative % 84 %   Neutro Abs 7.9 (H) 1.7 - 7.7 K/uL   Lymphocytes Relative 8 %   Lymphs Abs 0.7 0.7 - 4.0 K/uL   Monocytes Relative 8 %   Monocytes Absolute 0.8 0.1 - 1.0 K/uL   Eosinophils Relative 0 %   Eosinophils Absolute 0.0 0.0 - 0.7 K/uL   Basophils Relative 0 %   Basophils Absolute 0.0 0.0 - 0.1 K/uL  Comprehensive metabolic panel     Status: Abnormal   Collection Time: 08/26/15 10:05 AM  Result Value Ref Range   Sodium 142 135 - 145 mmol/L   Potassium 4.0 3.5 - 5.1 mmol/L   Chloride 104 101 - 111 mmol/L   CO2 28 22 - 32 mmol/L   Glucose, Bld 79 65 - 99 mg/dL   BUN 29 (H) 6 - 20  mg/dL   Creatinine, Ser 1.33 (H) 0.44 - 1.00 mg/dL   Calcium 9.6 8.9 - 10.3 mg/dL   Total Protein 6.4 (L) 6.5 - 8.1 g/dL   Albumin 3.8 3.5 - 5.0 g/dL   AST 23 15 - 41 U/L   ALT 19 14 - 54 U/L   Alkaline Phosphatase 48 38 - 126 U/L   Total Bilirubin 0.2 (L) 0.3 - 1.2 mg/dL   GFR calc non Af Amer 33 (L) >60 mL/min   GFR calc Af Amer 38 (L) >60 mL/min    Comment: (NOTE) The eGFR has been calculated using the CKD EPI equation. This calculation has not been validated in all clinical situations. eGFR's persistently <60 mL/min signify possible Chronic Kidney Disease.    Anion gap 10 5 - 15  I-stat troponin, ED (not at [facility], [facility])     Status: None   Collection Time: 08/26/15 10:14 AM  Result Value Ref Range   Troponin i, poc 0.01 0.00 - 0.08 ng/mL   Comment 3            Comment: Due to the release kinetics of cTnI, Crystal negative result within the first hours of the onset of symptoms does not rule out myocardial infarction with certainty. If myocardial infarction Henson still suspected, repeat the test at appropriate intervals.   Urinalysis, Routine w reflex microscopic (not at [facility])     Status: Abnormal   Collection Time: 08/26/15 10:45 AM  Result Value Ref Range   Color, Urine YELLOW YELLOW   APPearance CLOUDY (Crystal) CLEAR   Specific Gravity, Urine 1.020 1.005 - 1.030   pH 6.5 5.0 - 8.0   Glucose, UA NEGATIVE NEGATIVE mg/dL   Hgb urine dipstick NEGATIVE NEGATIVE   Bilirubin Urine NEGATIVE NEGATIVE   Ketones, ur NEGATIVE NEGATIVE mg/dL   Protein, ur NEGATIVE NEGATIVE mg/dL   Nitrite NEGATIVE NEGATIVE   Leukocytes, UA SMALL (Crystal) NEGATIVE  Urine microscopic-add on     Status: Abnormal   Collection Time: 08/26/15 10:45 AM  Result Value Ref Range   Squamous Epithelial / LPF 0-5 (Crystal) NONE SEEN   WBC, UA 0-5 0 - 5 WBC/hpf   RBC / HPF NONE SEEN 0 - 5 RBC/hpf   Bacteria, UA RARE (Crystal) NONE SEEN   Urine-Other AMORPHOUS URATES/PHOSPHATES   POC occult blood, ED     Status: Abnormal   Collection  Time: 08/26/15 11:36 AM  Result Value Ref Range   Fecal Occult Bld POSITIVE (Crystal) NEGATIVE  Type and screen     Status: None (Preliminary  result)   Collection Time: 08/26/15  1:05 PM  Result Value Ref Range   ABO/RH(D) O POS    Antibody Screen NEG    Sample Expiration 08/29/2015    Unit Number [id number]    Blood Component Type RED CELLS,LR    Unit division 00    Status of Unit ALLOCATED    Transfusion Status OK TO TRANSFUSE    Crossmatch Result Compatible   Prepare RBC     Status: None   Collection Time: 08/26/15  1:05 PM  Result Value Ref Range   Order Confirmation ORDER PROCESSED BY BLOOD BANK   CBC with Differential/Platelet     Status: Abnormal   Collection Time: 08/26/15  1:05 PM  Result Value Ref Range   WBC 9.8 4.0 - 10.5 K/uL   RBC 2.59 (L) 3.87 - 5.11 MIL/uL   Hemoglobin 6.7 (LL) 12.0 - 15.0 g/dL    Comment: REPEATED TO VERIFY CRITICAL RESULT CALLED TO, READ BACK BY AND VERIFIED WITH: Crystal WHEELER,RN 08/26/15 1332 RHOLMES    HCT 22.7 (L) 36.0 - 46.0 %   MCV 87.6 78.0 - 100.0 fL   MCH 25.9 (L) 26.0 - 34.0 pg   MCHC 29.5 (L) 30.0 - 36.0 g/dL   RDW 14.1 11.5 - 15.5 %   Platelets 382 150 - 400 K/uL   Neutrophils Relative % 79 %   Neutro Abs 7.7 1.7 - 7.7 K/uL   Lymphocytes Relative 15 %   Lymphs Abs 1.5 0.7 - 4.0 K/uL   Monocytes Relative 6 %   Monocytes Absolute 0.6 0.1 - 1.0 K/uL   Eosinophils Relative 0 %   Eosinophils Absolute 0.0 0.0 - 0.7 K/uL   Basophils Relative 0 %   Basophils Absolute 0.0 0.0 - 0.1 K/uL  ABO/Rh     Status: None   Collection Time: 08/26/15  1:05 PM  Result Value Ref Range   ABO/RH(D) O POS    Dg Chest 2 View  Result Date: 08/26/2015 CLINICAL DATA:  Ataxia, Crystal phase GIA, history of hypertension, atrial fibrillation, and CHF EXAM: CHEST  2 VIEW COMPARISON:  Portable chest x-ray of Jun 21, 2014 and PA and lateral chest x-ray of April 26, 2013. FINDINGS: The lungs are well-expanded. There Henson no focal infiltrate. The interstitial markings are  coarse in the perihilar regions but this Henson stable. There Henson Crystal stable approximately Henson 5 x 6 mm calcification in the left upper lobe posteriorly. The cardiac silhouette remains enlarged. The pulmonary vascularity Henson not engorged. The permanent pacemaker Henson in stable position. There Henson calcification in the wall of the aortic arch. There Henson mild multilevel degenerative disc disease of the thoracic spine. There are approximately 50% compressions of the bodies of L1 and L2 which are unchanged since Crystal lateral chest x-ray of April 26, 2013. IMPRESSION: 1. No acute cardiopulmonary abnormality. Underlying chronic bronchitic changes with evidence of previous granulomatous infection. 2. Cardiomegaly without pulmonary vascular congestion. Reasonable positioning of the permanent pacemaker. 3. Aortic atherosclerosis. Electronically Signed   By: David  Martinique M.D.   On: 08/26/2015 10:26   Ct Head Wo Contrast  Result Date: 08/26/2015 CLINICAL DATA:  Slurred speech.  Ataxia. EXAM: CT HEAD WITHOUT CONTRAST TECHNIQUE: Contiguous axial images were obtained from the base of the skull through the vertex without intravenous contrast. COMPARISON:  None FINDINGS: Brain: There Henson moderate diffuse atrophy. There Henson no intracranial mass, hemorrhage, extra-axial fluid collection, or midline shift. There Henson small vessel disease throughout the centra semiovale bilaterally.  Small vessel disease Henson also noted throughout much of the internal and external capsules bilaterally. There Henson evidence of Crystal prior lacunar type infarct in the superior right thalamus. There Henson no evident acute infarct. Vascular: There Henson no hyperdense vessel. There Henson calcification in each carotid siphon region. Skull: The bony calvarium appears intact. Sinuses/Orbits: Visualized paranasal sinuses are clear. No intraorbital lesions are evident. Other: Mastoid air cells are clear. IMPRESSION: Atrophy with extensive supratentorial small vessel disease. Prior small lacunar  infarct right thalamus. No acute infarct evident. No hemorrhage or mass effect. There Henson extensive carotid siphon region calcification bilaterally. Electronically Signed   By: Lowella Grip III M.D.   On: 08/26/2015 11:34    Review of Systems  Constitutional: Positive for malaise/fatigue. Negative for chills and fever.  Eyes: Negative for blurred vision and double vision.  Respiratory: Negative for cough, hemoptysis and shortness of breath.   Cardiovascular: Negative for chest pain, palpitations, orthopnea and leg swelling.  Gastrointestinal: Negative for abdominal pain and vomiting.  Genitourinary: Negative for dysuria.  Neurological: Negative for dizziness, focal weakness and seizures.    Blood pressure 155/66, pulse 60, temperature 98.1 F (36.7 C), temperature source Oral, resp. rate 22, SpO2 98 %. Physical Exam  Constitutional: She Henson oriented to person, place, and time.  Eyes: Left eye exhibits no discharge.  Neck: Normal range of motion. Neck supple. No JVD present. No tracheal deviation present. No thyromegaly present.  Cardiovascular:  Irregularly irregular, S1, S2 soft.  There Henson 3/6 systolic murmur noted  Respiratory: Effort normal and breath sounds normal. No respiratory distress. She has no wheezes. She has no rales.  GI: Soft. Bowel sounds are normal. She exhibits no distension. There Henson no tenderness. There Henson no rebound.  Musculoskeletal: She exhibits no edema, tenderness or deformity.  Neurological: She Henson alert and oriented to person, place, and time.     Assessment/Plan Acute lower GI bleed.possibly diverticular bleed Acute blood loss anemia secondary to above Status post questionable TIA. Unsteady gait. Severe aortic stenosis. Hypertension. Chronic atrial fibrillation. Tachybradycardia syndrome status post permanent pacemaker. Compensated systolic congestive heart failure. Peripheral vascular disease. Hyperlipidemia. GERD. History of lacunar thalamic  infarct in the past. Plan As per orders. Discussed with patient and her son regarding GI evaluation.  Patient and family not interested in further evaluation, but agrees for blood transfusion. Also discussed regarding neurology evaluation but wanted to be treated medically only. Patient Henson DNR.  Charolette Forward, MD 08/26/2015, 3:08 PM

## 2015-08-26 NOTE — ED Notes (Signed)
Assisted patient with bedpan. Patient reposioned

## 2015-08-26 NOTE — ED Triage Notes (Signed)
Pt from home with c/o increased slurred speech, staring off during conversation, falls, leaning to the side, and unable to follow commands per son for several weeks now. Pt had recent right patella fracture.  Pt in NAD, alert.

## 2015-08-27 DIAGNOSIS — K922 Gastrointestinal hemorrhage, unspecified: Secondary | ICD-10-CM | POA: Diagnosis not present

## 2015-08-27 LAB — BASIC METABOLIC PANEL
ANION GAP: 9 (ref 5–15)
BUN: 25 mg/dL — ABNORMAL HIGH (ref 6–20)
CALCIUM: 8.8 mg/dL — AB (ref 8.9–10.3)
CHLORIDE: 104 mmol/L (ref 101–111)
CO2: 26 mmol/L (ref 22–32)
Creatinine, Ser: 1.12 mg/dL — ABNORMAL HIGH (ref 0.44–1.00)
GFR calc non Af Amer: 41 mL/min — ABNORMAL LOW (ref >60)
GFR, EST AFRICAN AMERICAN: 47 mL/min — AB (ref >60)
Glucose, Bld: 106 mg/dL — ABNORMAL HIGH (ref 65–99)
POTASSIUM: 3.7 mmol/L (ref 3.5–5.1)
Sodium: 139 mmol/L (ref 135–145)

## 2015-08-27 LAB — CBC
HEMATOCRIT: 27.1 % — AB (ref 36.0–46.0)
HEMOGLOBIN: 8.4 g/dL — AB (ref 12.0–15.0)
MCH: 27 pg (ref 26.0–34.0)
MCHC: 31 g/dL (ref 30.0–36.0)
MCV: 87.1 fL (ref 78.0–100.0)
Platelets: 335 10*3/uL (ref 150–400)
RBC: 3.11 MIL/uL — AB (ref 3.87–5.11)
RDW: 14 % (ref 11.5–15.5)
WBC: 7.5 10*3/uL (ref 4.0–10.5)

## 2015-08-27 LAB — ABO/RH: ABO/RH(D): O POS

## 2015-08-27 MED ORDER — ACETAMINOPHEN ER 650 MG PO TBCR: 650.0000 mg | EXTENDED_RELEASE_TABLET | ORAL | 3 refills | Status: DC

## 2015-08-27 MED ORDER — FERROUS SULFATE 220 (44 FE) MG/5ML PO ELIX: 220.0000 mg | ORAL_SOLUTION | Freq: Two times a day (BID) | ORAL | 3 refills | Status: AC

## 2015-08-27 NOTE — Discharge Summary (Signed)
Discharge summary dictated on 08/27/2015, dictation number is [phone]

## 2015-08-27 NOTE — Care Management Obs Status (Signed)
MEDICARE OBSERVATION STATUS NOTIFICATION   Patient Details  Name: ASTER DAUER MRN: [id number] Date of Birth: [birth year]   Medicare Observation Status Notification Given:  Yes    Lawerance Sabal, RN 08/27/2015, 9:28 AM

## 2015-08-27 NOTE — Evaluation (Signed)
Clinical/Bedside Swallow Evaluation Patient Details  Name: [name] MRN: [id number] Date of Birth: 1918-05-02  Today's Date: 08/27/2015 Time: SLP Start Time (ACUTE ONLY): 0810 SLP Stop Time (ACUTE ONLY): 0840 SLP Time Calculation (min) (ACUTE ONLY): 30 min  Past Medical History:  Past Medical History:  Diagnosis Date  . A-fib ([facility])   . Aortic stenosis, severe   . CHF (congestive heart failure) ([facility])   . GI bleed   . Hip fracture ([facility])   . Hyperlipemia   . Hypertension   . Hypothyroidism   . Osteoarthritis   . Osteoporosis   . Pacemaker   . Patella fracture    right  . Sick sinus syndrome ([facility])   . Tachypnea    Past Surgical History:  Past Surgical History:  Procedure Laterality Date  . HIP ARTHROPLASTY  08/11/2011   Procedure: ARTHROPLASTY BIPOLAR HIP;  Surgeon: Shelda Pal, MD;  Location: [facility];  Service: Orthopedics;  Laterality: Left;  . HUMERUS FRACTURE SURGERY    . PERIPHERAL VASCULAR CATHETERIZATION N/A 04/19/2015   Procedure: Abdominal Aortogram;  Surgeon: Fransisco Hertz, MD;  Location: [facility];  Service: Cardiovascular;  Laterality: N/A;   HPI:  Patient is a 82 year old female with multiple cardiac problems including atrial fibrillation only on aspirin presenting today with a two-week history of intermittent gait difficulty and occasional controlled falls, occasional aphasia that has persisted. Family member states these things happen on a daily basis but are not continuous. CT head negative, CXR negative.    Assessment / Plan / Recommendation Clinical Impression  Pt demonstrates no signs of acute dysphagia or neuromuscular impairment. She takes bites and sips with adeqaute strength and no immediate signs of aspiration. Pt did have intermittent throat clearing during speech. Recommend pt continue a regular diet and thin liquids. No SLP f/u needed for swallowing.     Aspiration Risk  Mild aspiration risk    Diet Recommendation Regular;Thin liquid    Liquid Administration via: Cup;Straw Medication Administration: Whole meds with liquid Supervision: Patient able to self feed Postural Changes: Seated upright at 90 degrees    Other  Recommendations Oral Care Recommendations: Oral care BID   Follow up Recommendations  None    Frequency and Duration            Prognosis        Swallow Study   General HPI: Patient is a 82 year old female with multiple cardiac problems including atrial fibrillation only on aspirin presenting today with a two-week history of intermittent gait difficulty and occasional controlled falls, occasional aphasia that has persisted. Family member states these things happen on a daily basis but are not continuous. CT head negative, CXR negative.  Type of Study: Bedside Swallow Evaluation Previous Swallow Assessment: none Diet Prior to this Study: Regular;Thin liquids Temperature Spikes Noted: No Respiratory Status: Room air History of Recent Intubation: No Behavior/Cognition: Alert;Cooperative;Pleasant mood Oral Cavity Assessment: Within Functional Limits Oral Care Completed by SLP: No Oral Cavity - Dentition: Adequate natural dentition Vision: Functional for self-feeding Self-Feeding Abilities: Able to feed self Patient Positioning: Upright in bed Baseline Vocal Quality: Normal Volitional Cough: Strong Volitional Swallow: Able to elicit    Oral/Motor/Sensory Function     Ice Chips     Thin Liquid Thin Liquid: Within functional limits Presentation: Cup;Self Fed;Straw    Nectar Thick Nectar Thick Liquid: Not tested   Honey Thick Honey Thick Liquid: Not tested   Puree Puree: Within functional limits   Solid  GO   Solid: Within functional limits Presentation: Self Fed       Harlon Ditty, MA CCC-SLP [phone]  Claudine Mouton 08/27/2015,9:21 AM

## 2015-08-27 NOTE — Discharge Instructions (Signed)
Anemia, Nonspecific Anemia is a condition in which the concentration of red blood cells or hemoglobin in the blood is below normal. Hemoglobin is a substance in red blood cells that carries oxygen to the tissues of the body. Anemia results in not enough oxygen reaching these tissues.  CAUSES  Common causes of anemia include:   Excessive bleeding. Bleeding may be internal or external. This includes excessive bleeding from periods (in women) or from the intestine.   Poor nutrition.   Chronic kidney, thyroid, and liver disease.  Bone marrow disorders that decrease red blood cell production.  Cancer and treatments for cancer.  HIV, AIDS, and their treatments.  Spleen problems that increase red blood cell destruction.  Blood disorders.  Excess destruction of red blood cells due to infection, medicines, and autoimmune disorders. SIGNS AND SYMPTOMS   Minor weakness.   Dizziness.   Headache.  Palpitations.   Shortness of breath, especially with exercise.   Paleness.  Cold sensitivity.  Indigestion.  Nausea.  Difficulty sleeping.  Difficulty concentrating. Symptoms may occur suddenly or they may develop slowly.  DIAGNOSIS  Additional blood tests are often needed. These help your health care provider determine the best treatment. Your health care provider will check your stool for blood and look for other causes of blood loss.  TREATMENT  Treatment varies depending on the cause of the anemia. Treatment can include:   Supplements of iron, vitamin B12, or folic acid.   Hormone medicines.   A blood transfusion. This may be needed if blood loss is severe.   Hospitalization. This may be needed if there is significant continual blood loss.   Dietary changes.  Spleen removal. HOME CARE INSTRUCTIONS Keep all follow-up appointments. It often takes many weeks to correct anemia, and having your health care provider check on your condition and your response to  treatment is very important. SEEK IMMEDIATE MEDICAL CARE IF:   You develop extreme weakness, shortness of breath, or chest pain.   You become dizzy or have trouble concentrating.  You develop heavy vaginal bleeding.   You develop a rash.   You have bloody or black, tarry stools.   You faint.   You vomit up blood.   You vomit repeatedly.   You have abdominal pain.  You have a fever or persistent symptoms for more than 2-3 days.   You have a fever and your symptoms suddenly get worse.   You are dehydrated.  MAKE SURE YOU:  Understand these instructions.  Will watch your condition.  Will get help right away if you are not doing well or get worse.   This information is not intended to replace advice given to you by your health care provider. Make sure you discuss any questions you have with your health care provider.   Document Released: 02/17/2004 Document Revised: 09/11/2012 Document Reviewed: 07/05/2012 Elsevier Interactive Patient Education 2016 [employer]  

## 2015-08-27 NOTE — Evaluation (Signed)
Speech Language Pathology Evaluation Patient Details Name: [name] MRN: [id number] DOB: October 07, 1918 Today's Date: 08/27/2015 Time: 0810-0840 SLP Time Calculation (min) (ACUTE ONLY): 30 min  Problem List:  Patient Active Problem List   Diagnosis Date Noted  . Anemia 08/26/2015  . Atherosclerosis of native arteries of the extremities with ulceration (HCC) 04/09/2015  . Acquired autoimmune hypothyroidism 12/28/2014  . Acute lower GI bleeding 06/28/2013  . Pulmonary hypertension (HCC) 04/23/2013  . Diastolic CHF, acute on chronic (HCC) 04/23/2013  . Unspecified hypothyroidism 04/23/2013  . Tachypnea 08/15/2011  . Hypotension 08/12/2011  . Severe aortic stenosis 08/12/2011  . Hip fracture (HCC) 08/10/2011  . UTI (lower urinary tract infection) 08/10/2011  . Abdominal distention 08/10/2011  . Atrial fibrillation (HCC) 08/10/2011  . Murmur, cardiac 08/10/2011  . Atrial fibrillation with rapid ventricular response (HCC) 08/10/2011  . Chronic anticoagulation 08/10/2011   Past Medical History:  Past Medical History:  Diagnosis Date  . A-fib (HCC)   . Aortic stenosis, severe   . CHF (congestive heart failure) (HCC)   . GI bleed   . Hip fracture (HCC)   . Hyperlipemia   . Hypertension   . Hypothyroidism   . Osteoarthritis   . Osteoporosis   . Pacemaker   . Patella fracture    right  . Sick sinus syndrome (HCC)   . Tachypnea    Past Surgical History:  Past Surgical History:  Procedure Laterality Date  . HIP ARTHROPLASTY  08/11/2011   Procedure: ARTHROPLASTY BIPOLAR HIP;  Surgeon: Shelda Pal, MD;  Location: [facility] OR;  Service: Orthopedics;  Laterality: Left;  . HUMERUS FRACTURE SURGERY    . PERIPHERAL VASCULAR CATHETERIZATION N/A 04/19/2015   Procedure: Abdominal Aortogram;  Surgeon: Fransisco Hertz, MD;  Location: [facility] INVASIVE CV LAB;  Service: Cardiovascular;  Laterality: N/A;   HPI:  Patient is a 82 year old female with multiple cardiac problems including atrial  fibrillation only on aspirin presenting today with a two-week history of intermittent gait difficulty and occasional controlled falls, occasional aphasia that has persisted. Family member states these things happen on a daily basis but are not continuous. CT head negative, CXR negative.    Assessment / Plan / Recommendation Clinical Impression  Pt is a delightful colorful lady. She is able to engage in fluent conversation regarding a variety of topics with mild, likely age related word finding deficits observed. Receptive language is WNL. Memory is generally in tact, though pt struggles with orientation to date repeatedly in session. She is able to verbalize awareness of deficits. She does not appear to have any residual cognitive lingusitic impairments that would make her function any different that PTA. No SLP f/u recommended. Will sign off.     SLP Assessment  Patient does not need any further Speech Lanaguage Pathology Services    Follow Up Recommendations  None    Frequency and Duration           SLP Evaluation Prior Functioning  Cognitive/Linguistic Baseline: Within functional limits  Lives With: Alone (near family) Available Help at Discharge: Family;Available PRN/intermittently Vocation:  (retired, worked as an Designer, multimedia)   Cognition  Overall Cognitive Status: Impaired/Different from baseline Arousal/Alertness: Awake/alert Orientation Level: Oriented to person;Oriented to place;Oriented to situation;Disoriented to time Attention: Focused;Sustained;Selective;Alternating Focused Attention: Appears intact Sustained Attention: Appears intact Selective Attention: Appears intact Alternating Attention: Appears intact Memory: Impaired Memory Impairment: Retrieval deficit;Decreased short term memory Decreased Short Term Memory: Verbal basic Awareness: Appears intact Problem Solving: Appears intact Safety/Judgment: Appears  intact    Comprehension  Auditory  Comprehension Overall Auditory Comprehension: Appears within functional limits for tasks assessed Reading Comprehension Reading Status: Within funtional limits    Expression Verbal Expression Overall Verbal Expression: Appears within functional limits for tasks assessed Written Expression Dominant Hand: Right   Oral / Motor  Oral Motor/Sensory Function Overall Oral Motor/Sensory Function: Within functional limits Motor Speech Overall Motor Speech: Appears within functional limits for tasks assessed   GO                    [name], Riley Nearing 08/27/2015, 9:41 AM

## 2015-08-27 NOTE — Progress Notes (Signed)
Pt had a blood transfusion started during the day shift. The blood was completed during the night shift. Upon verifying the blood completion, the signed consent form could not be located. Pt reported that she had signed one previously. Another copy was obtained and filed. MD notified. Pt is doing well. Will continue to monitor.

## 2015-08-27 NOTE — Care Management Note (Signed)
Case Management Note  Patient Details  Name: [name] MRN: [id number] Date of Birth: Feb 16, 1918  Subjective/Objective:                 Patient in obs from home. Lives alone in a condo 4 doors away from her son [name] who is her primary support. He drives her to errands and appointments. Pharmacy is [facility]  Denies DME needs.  Action/Plan:  DC today self care.  Expected Discharge Date:                  Expected Discharge Plan:  Home/Self Care  In-House Referral:  NA  Discharge planning Services  CM Consult  Post Acute Care Choice:  NA Choice offered to:  NA  DME Arranged:  N/A DME Agency:  NA  HH Arranged:  NA HH Agency:  NA  Status of Service:  Completed, signed off  If discussed at Long Length of Stay Meetings, dates discussed:    Additional Comments:  Lawerance Sabal, RN 08/27/2015, 9:28 AM

## 2015-08-27 NOTE — Progress Notes (Signed)
Nsg Discharge Note  Admit Date:  08/26/2015 Discharge date: 08/27/2015   [name] to be D/C'd Home per MD order.  AVS completed.  Copy for chart, and copy for patient signed, and dated. Patient/caregiver able to verbalize understanding.  Discharge Medication:   Medication List    STOP taking these medications   aspirin 81 MG tablet     TAKE these medications   acetaminophen 650 MG CR tablet Commonly known as:  TYLENOL Take 1 tablet (650 mg total) by mouth every morning. Take 2 tablets by mouth in the morning and at bedtime daily What changed:  how much to take   amiodarone 200 MG tablet Commonly known as:  PACERONE Take 200 mg by mouth daily.   COLACE PO Take 1 tablet by mouth daily. Reported on 06/29/2015   denosumab 60 MG/ML Soln injection Commonly known as:  PROLIA Inject 60 mg into the skin every 6 (six) months. Administer in upper arm, thigh, or abdomen   digoxin 0.125 MG tablet Commonly known as:  LANOXIN Take 0.0625 mg by mouth daily.   ferrous sulfate 220 (44 Fe) MG/5ML solution Take 5 mLs (220 mg total) by mouth 2 (two) times daily with a meal.   furosemide 40 MG tablet Commonly known as:  LASIX Take 40 mg by mouth daily.   levothyroxine 100 MCG tablet Commonly known as:  SYNTHROID, LEVOTHROID Take 100 mcg by mouth daily.   metoprolol succinate 25 MG 24 hr tablet Commonly known as:  TOPROL-XL Take 25 mg by mouth daily.   SENNA LAXATIVE PO Take 1 tablet by mouth daily. Reported on 06/29/2015   traZODone 50 MG tablet Commonly known as:  DESYREL Take 25 mg by mouth at bedtime.       Discharge Assessment: Vitals:   08/27/15 0555 08/27/15 0928  BP: 127/64   Pulse: 61 64  Resp: 16   Temp: 97.4 F (36.3 C)    Skin clean, dry and intact without evidence of skin break down, no evidence of skin tears noted. IV catheter discontinued intact. Site without signs and symptoms of complications - no redness or edema noted at insertion site, patient  denies c/o pain - only slight tenderness at site.  Dressing with slight pressure applied.  D/c Instructions-Education: Discharge instructions given to patient/family with verbalized understanding. D/c education completed with patient/family including follow up instructions, medication list, d/c activities limitations if indicated, with other d/c instructions as indicated by MD - patient able to verbalize understanding, all questions fully answered. Patient instructed to return to ED, call 911, or call MD for any changes in condition.  Patient escorted via WC, and D/C home via private auto.  Kern Reap, RN 08/27/2015 11:21 AM

## 2015-08-27 NOTE — Discharge Summary (Signed)
[name], [name]            ACCOUNT NO.:  [id number]  MEDICAL RECORD NO.:  [id number]  LOCATION:  5W11C                        FACILITY:  [facility]  PHYSICIAN:  [name]. [name], M.D. DATE OF BIRTH:  1918/02/02  DATE OF ADMISSION:  08/26/2015 DATE OF DISCHARGE:  08/27/2015                              DISCHARGE SUMMARY   ADMITTING DIAGNOSES: 1. Acute lower GI bleed, possibly diverticular bleed/hemorrhoidal     bleed. 2. Acute blood loss anemia secondary to above. 3. Status post questionable transient ischemic attack. 4. Unsteady gait. 5. Severe aortic stenosis. 6. Hypertension. 7. Chronic atrial fibrillation. 8. Tachy-brady syndrome status post permanent pacemaker in the past. 9. Compensated systolic congestive heart failure. 10.Peripheral vascular disease. 11.Hyperlipidemia. 12.Gastroesophageal reflux disease. 13.History of lacunar thalamic infarct in the past.  DISCHARGE DIAGNOSES: 1. Status post lower GI bleed, stable. 2. Status post acute blood loss anemia, stable. 3. Status post questionable transient ischemic attack. 4. Unsteady gait. 5. Severe aortic stenosis. 6. Hypertension. 7. Chronic atrial fibrillation. 8. Tachy-brady syndrome status post permanent pacemaker. 9. Compensated systolic congestive heart failure. 10.Peripheral vascular disease. 11.Hyperlipidemia. 12.Gastroesophageal reflux disease. 13.History of lacunar thalamic infarct in the past.  DISCHARGE MEDICATIONS: 1. Ferrous sulfate 220 mg/5 mL solution twice daily with meals. 2. Continue amiodarone 200 mg 1 tablet daily. 3. Colace 100 mg 1 tablet daily. 4. Prolia 60 mg into the skin every 6 months. 5. Digoxin 0.125 mg half tablet daily. 6. Furosemide 40 mg daily. 7. Levothyroxine 100 mcg daily. 8. Metoprolol succinate 25 mg daily. 9. Senna laxative 1 tablet daily. 10.Trazodone 25 mg daily at night. 11.Tylenol 650 mg twice daily as needed. 12.The patient was advised to stop aspirin.  DIET:   As tolerated.  CONDITION AT DISCHARGE:  Stable.  BRIEF HISTORY AND HOSPITAL COURSE:  [name] is a 82 year old female with past medical history significant for multiple medical problems, i.e., severe aortic stenosis, hypertension, congestive heart failure secondary to depressed LV systolic function, chronic atrial fibrillation, hypertension, hyperlipidemia, peripheral vascular disease, osteoarthritis/osteopenia, sick sinus syndrome, history of tachy-brady syndrome status post permanent pacemaker, history of thalamic lacunar infarct in the past, history of diverticular bleed in the past.  She came to the ER complaining of unsteady gait, associated with occasional episodes of aphasia off and on for last 2 weeks and generalized weakness.  The patient was noted to be anemic with hemoglobin of 7.3, repeat hemoglobin was 6.7  the patient complained of occasional bright red blood per rectum on the bed sheets and states she suffers from hemorrhoids.  Denies any abdominal pain.  Denies any chest pain.  Denies any weakness in the arms or legs.  Denies any seizure activity.  The patient was noted to have heme-positive stools.  The patient is not on any anticoagulation in view of history of falls and her age, but she was on aspirin 81 mg daily.  No history of NSAIDs abuse.  PHYSICAL EXAMINATION:  GENERAL:  She was alert, awake, oriented. VITAL SIGNS:  Blood pressure was 155/66, pulse 60 irregularly irregular. She was afebrile. EYES:  Conjunctivae pale. NECK:  Supple.  No JVD.  No bruit. LUNGS:  Clear to auscultation without rhonchi or rales. CARDIOVASCULAR:  Irregularly irregular,  S1-S2 soft.  There were 3/6 systolic murmur noted. ABDOMEN:  Soft.  Bowel sounds are present.  Nontender. EXTREMITIES:  There is no clubbing, cyanosis, or edema. NEURO:  She was alert, awake, oriented x3.  No focal deficits noted.  LABS:  Sodium was 142, potassium 4.0, BUN 29, creatinine 1.33. Hemoglobin was 7.4,  hematocrit 25.4, repeat hemoglobin was 6.7 and hematocrit 22.7.  Post transfusion hemoglobin was 8.3, hematocrit 27, and repeat hemoglobin this morning is 8.4 and hematocrit 27.1.  Her potassium this morning 3.7, BUN 25, creatinine 1.12.  BRIEF HOSPITAL COURSE:  The patient was admitted to Med-Surgical unit. Received 1 unit of packed RBC with appropriate increase in her hemoglobin from 6.7-8.3, repeat hemoglobin has been stable.  Discussed with the patient and family regarding GI and Neuro consultation.  The patient and family refused for any further workup or consultations.  The patient is eager to go home and will be discharged home on above medications and will be followed up in my office in 2 weeks.     [name]. [name], M.D.     M[facility]/MEDQ  D:  08/27/2015  T:  08/27/2015  Job:  [id number]

## 2015-08-30 LAB — TYPE AND SCREEN
ABO/RH(D): O POS
ANTIBODY SCREEN: NEGATIVE
UNIT DIVISION: 0
UNIT DIVISION: 0

## 2015-09-16 NOTE — Progress Notes (Signed)
   08/27/15 0900  SLP Visit Information  SLP Received On 08/27/15  Subjective  Subjective I get to go home today  Patient/Family Stated Goal home  General Information  HPI Patient is a 82 year old female with multiple cardiac problems including atrial fibrillation only on aspirin presenting today with a two-week history of intermittent gait difficulty and occasional controlled falls, occasional aphasia that has persisted. Family member states these things happen on a daily basis but are not continuous. CT head negative, CXR negative.   Type of Study Bedside Swallow Evaluation  Previous Swallow Assessment none  Diet Prior to this Study Regular;Thin liquids  Temperature Spikes Noted No  Respiratory Status Room air  History of Recent Intubation No  Behavior/Cognition Alert;Cooperative;Pleasant mood  Oral Cavity Assessment WFL  Oral Care Completed by SLP No  Oral Cavity - Dentition Adequate natural dentition  Vision Functional for self-feeding  Self-Feeding Abilities Able to feed self  Patient Positioning Upright in bed  Baseline Vocal Quality Normal  Volitional Cough Strong  Volitional Swallow Able to elicit  Pain Assessment  Pain Assessment No/denies pain  Oral Motor/Sensory Function  Overall Oral Motor/Sensory Function WFL  Thin Liquid  Thin Liquid WFL  Presentation Cup;Self Fed;Straw  Nectar Thick Liquid  Nectar Thick Liquid NT  Honey Thick Liquid  Honey Thick Liquid NT  Puree  Puree WFL  Solid  Solid WFL  Presentation Self Fed  SLP - End of Session  Patient left in bed  SLP Assessment  Clinical Impression Statement (ACUTE ONLY) Pt demonstrates no signs of acute dysphagia or neuromuscular impairment. She takes bites and sips with adeqaute strength and no immediate signs of aspiration. Pt did have intermittent throat clearing during speech. Recommend pt continue a regular diet and thin liquids. No SLP f/u needed for swallowing.   Impact on safety and function Mild  aspiration risk  Swallow Evaluation Recommendations  SLP Diet Recommendations Regular;Thin liquid  Liquid Administration via Cup;Straw  Medication Administration Whole meds with liquid  Supervision Patient able to self feed  Postural Changes Seated upright at 90 degrees  Treatment Plan  Oral Care Recommendations Oral care BID  Treatment Recommendations No treatment recommended at this time  Follow up Recommendations None  Individuals Consulted  Consulted and Agree with Results and Recommendations Patient;RN  SLP Time Calculation  SLP Start Time (ACUTE ONLY) 0810  SLP Stop Time (ACUTE ONLY) 0840  SLP Time Calculation (min) (ACUTE ONLY) 30 min  SLP G-Codes **NOT FOR INPATIENT CLASS**  Functional Assessment Tool Used skilled clinical judgement via chart review  Functional Limitations Swallowing  Swallow Current Status (Z6109(G8996) [facility]  Swallow Goal Status (U0454(G8997) [facility]  Swallow Discharge Status (U9811(G8998) [facility]  SLP Evaluations  $ SLP Speech Visit 1 Procedure  SLP Evaluations  $BSS Swallow 1 Procedure  Late entry for missed G-code. Based on review of the evaluation and goals by Harlon DittyBonnie Deblois, SLP.   Ferdinand LangoLeah Brittanni Cariker MA, CCC-SLP [phone]

## 2015-09-29 ENCOUNTER — Encounter: Payer: Self-pay | Admitting: Internal Medicine

## 2015-10-14 ENCOUNTER — Encounter: Payer: Self-pay | Admitting: Internal Medicine

## 2015-10-14 ENCOUNTER — Ambulatory Visit (INDEPENDENT_AMBULATORY_CARE_PROVIDER_SITE_OTHER): Payer: Medicare Other | Admitting: Internal Medicine

## 2015-10-14 VITALS — BP 102/58 | HR 62 | Ht 62.0 in | Wt 107.6 lb

## 2015-10-14 DIAGNOSIS — I480 Paroxysmal atrial fibrillation: Secondary | ICD-10-CM

## 2015-10-14 DIAGNOSIS — Z950 Presence of cardiac pacemaker: Secondary | ICD-10-CM

## 2015-10-14 DIAGNOSIS — I7025 Atherosclerosis of native arteries of other extremities with ulceration: Secondary | ICD-10-CM

## 2015-10-14 DIAGNOSIS — I495 Sick sinus syndrome: Secondary | ICD-10-CM | POA: Diagnosis not present

## 2015-10-14 MED ORDER — FUROSEMIDE 40 MG PO TABS: ORAL_TABLET | ORAL | 3 refills | Status: AC

## 2015-10-14 NOTE — Patient Instructions (Addendum)
Medication Instructions:  Your physician has recommended you make the following change in your medication:  1) Increase Furosemide to 80 mg daily for 3 days then go back to 40 mg daily  If needed may take 80 mg alternating with 40 mg   Labwork: None ordered   Testing/Procedures: None ordered   Follow-Up: Your physician wants you to follow-up in: 12 months with Dr Logan BoresKlein You will receive a reminder letter in the mail two months in advance. If you don't receive a letter, please call our office to schedule the follow-up appointment.  Remote monitoring is used to monitor your Pacemaker  from home. This monitoring reduces the number of office visits required to check your device to one time per year. It allows us to keep an eye on the functioning of your device to ensure it is working properly. You are scheduled for a device check from home on 01/13/16. You may send your transmission at any time that day. If you have a wireless device, the transmission will be sent automatically. After your physician reviews your transmission, you will receive a postcard with your next transmission date.    Any Other Special Instructions Will Be Listed Below (If Applicable).     If you need a refill on your cardiac medications before your next appointment, please call your pharmacy.

## 2015-10-14 NOTE — Progress Notes (Signed)
ELECTROPHYSIOLOGY Progress  NOTE  Patient ID: [name], MRN: [id number], DOB/AGE: 09/15/1918 82 y.o. Admit date: (Not on file) Date of Consult: 10/14/2015  Primary Physician: Rinaldo Cloud, MD Primary Cardiologist:MH        HPI [name] is a 82 y.o. female  Seen in followup for pacmekaer implant [place] PA for tachybradysyndrome  Hx of PAF  Not on Anticoagulation secondary to GI bleeding.  She's had more edema of late and and less energy. Her furosemide dose does not cause her to urinate briskly.      Past Medical History:  Diagnosis Date  . A-fib (HCC)   . Aortic stenosis, severe   . CHF (congestive heart failure) (HCC)   . GI bleed   . Hip fracture (HCC)   . Hyperlipemia   . Hypertension   . Hypothyroidism   . Osteoarthritis   . Osteoporosis   . Pacemaker   . Patella fracture    right  . Sick sinus syndrome (HCC)   . Tachypnea       Surgical History:  Past Surgical History:  Procedure Laterality Date  . HIP ARTHROPLASTY  08/11/2011   Procedure: ARTHROPLASTY BIPOLAR HIP;  Surgeon: Shelda Pal, MD;  Location: [facility] OR;  Service: Orthopedics;  Laterality: Left;  . HUMERUS FRACTURE SURGERY    . PERIPHERAL VASCULAR CATHETERIZATION N/A 04/19/2015   Procedure: Abdominal Aortogram;  Surgeon: Fransisco Hertz, MD;  Location: [facility] INVASIVE CV LAB;  Service: Cardiovascular;  Laterality: N/A;     Home Meds: Prior to Admission medications   Medication Sig Start Date End Date Taking? Authorizing Provider  acetaminophen (TYLENOL) 650 MG CR tablet Take 1 tablet (650 mg total) by mouth every morning. Take 2 tablets by mouth in the morning and at bedtime daily 08/27/15   Rinaldo Cloud, MD  amiodarone (PACERONE) 200 MG tablet Take 200 mg by mouth daily.    Historical Provider, MD  denosumab (PROLIA) 60 MG/ML SOLN injection Inject 60 mg into the skin every 6 (six) months. Administer in upper arm, thigh, or abdomen    Historical Provider, MD  digoxin  (LANOXIN) 0.125 MG tablet Take 0.0625 mg by mouth daily.    Historical Provider, MD  Docusate Sodium (COLACE PO) Take 1 tablet by mouth daily. Reported on 06/29/2015    Historical Provider, MD  ferrous sulfate 220 (44 Fe) MG/5ML solution Take 5 mLs (220 mg total) by mouth 2 (two) times daily with a meal. 08/27/15   Rinaldo Cloud, MD  furosemide (LASIX) 40 MG tablet Take 40 mg by mouth daily.     Historical Provider, MD  levothyroxine (SYNTHROID, LEVOTHROID) 100 MCG tablet Take 100 mcg by mouth daily.     Historical Provider, MD  metoprolol succinate (TOPROL-XL) 25 MG 24 hr tablet Take 25 mg by mouth daily.    Historical Provider, MD  Sennosides (SENNA LAXATIVE PO) Take 1 tablet by mouth daily. Reported on 06/29/2015    Historical Provider, MD  traZODone (DESYREL) 50 MG tablet Take 25 mg by mouth at bedtime.     Historical Provider, MD    Allergies: No Known Allergies  Social History   Social History  . Marital status: Single    Spouse name: N/A  . Number of children: N/A  . Years of education: N/A   Occupational History  . Not on file.   Social History Main Topics  . Smoking status: Never Smoker  . Smokeless tobacco: Never Used  . Alcohol  use 0.0 oz/week     Comment: once a week, on Sunday  . Drug use: No  . Sexual activity: Not on file   Other Topics Concern  . Not on file   Social History Narrative  . No narrative on file     Family History  Problem Relation Age of Onset  . Bipolar disorder Mother      ROS:  Please see the history of present illness.  All other systems reviewed and negative.    Physical Exam:   Blood pressure (!) 102/58, pulse 62, height 5\' 2"  (1.575 m), weight 107 lb 9.6 oz (48.8 kg), SpO2 98 %. General: Well developed, well nourished female in no acute distress. Head: Normocephalic, atraumatic, sclera non-icteric, no xanthomas, nares are without discharge. EENT: normal    . Back:with  Kyphosis  Lungs: Clear bilaterally to auscultation without  wheezes, rales, or rhonchi. Breathing is unlabored. Heart: RRR with S1 S2.  2/6 systolic  murmur . No rubs, or gallops appreciated. Abdomen: Soft, non-tender, non-distended with normoactive bowel sounds. No hepatomegaly. No rebound/guarding. No obvious abdominal masses. Msk:  Strength and tone appear normal for age. Extremities: No clubbing or cyanosis.  2 +  edema.  Distal pedal pulses are 2+ and equal bilaterally. Skin: Warm and Dry Neuro: Alert and oriented X 3. CN III-XII intact Grossly normal sensory and motor function . Psych:  Responds to questions appropriately with a normal affect.      Labs: Cardiac Enzymes No results for input(s): CKTOTAL, CKMB, TROPONINI in the last 72 hours. CBC Lab Results  Component Value Date   WBC 7.5 08/27/2015   HGB 8.4 (L) 08/27/2015   HCT 27.1 (L) 08/27/2015   MCV 87.1 08/27/2015   PLT 335 08/27/2015   PROTIME: No results for input(s): LABPROT, INR in the last 72 hours. Chemistry No results for input(s): NA, K, CL, CO2, BUN, CREATININE, CALCIUM, PROT, BILITOT, ALKPHOS, ALT, AST, GLUCOSE in the last 168 hours.  Invalid input(s): LABALBU Lipids Lab Results  Component Value Date   CHOL 125 04/23/2013   HDL 45 04/23/2013   LDLCALC 63 04/23/2013   TRIG 86 04/23/2013   BNP Pro B Natriuretic peptide (BNP)  Date/Time Value Ref Range Status  04/28/2013 03:55 AM 4,959.0 (H) 0 - 450 pg/mL Final  04/26/2013 03:48 AM 4,369.0 (H) 0 - 450 pg/mL Final  04/23/2013 09:49 AM 5,289.0 (H) 0 - 450 pg/mL Final  04/23/2013 06:45 AM 5,785.0 (H) 0 - 450 pg/mL Final   Thyroid Function Tests: No results for input(s): TSH, T4TOTAL, T3FREE, THYROIDAB in the last 72 hours.  Invalid input(s): FREET3 Miscellaneous No results found for: DDIMER  Radiology/Studies:  No results found.  EKG:   Atrial pacing at 60 Intervals 32/11/45 Axis (76   Assessment and Plan:  Tachybradycardia syndrome  First-degree AV block  Atrial fibrillation  Pacemaker-[employer] The patient's device was interrogated and the information was fully reviewed.  The device was reprogrammed to increase AV delay given increased RV pacing threshold this will help promote intrinsic conduction and minimize ventricular pacing  HFpEF   High risk medication surveillance  the patient seems to be tolerating her amiodarone atrial fibrillation relatively sparse.  No active bleeding.  She is volume overloaded; this is likely related to HFpEF we will increase her diuretics Lasix 40--803 days and then back off. If there is a reaccumulation of fluid she will go to 40 alternating with 80.Sherryl Manges.    Victorina Kable

## 2015-12-30 ENCOUNTER — Encounter (HOSPITAL_COMMUNITY): Payer: Self-pay | Admitting: *Deleted

## 2015-12-30 ENCOUNTER — Emergency Department (HOSPITAL_COMMUNITY): Payer: Medicare Other

## 2015-12-30 ENCOUNTER — Emergency Department (HOSPITAL_COMMUNITY)
Admission: EM | Admit: 2015-12-30 | Discharge: 2015-12-30 | Disposition: A | Discharge status: Home / Self Care | Payer: Medicare Other | Attending: Emergency Medicine | Admitting: Emergency Medicine

## 2015-12-30 DIAGNOSIS — I110 Hypertensive heart disease with heart failure: Secondary | ICD-10-CM | POA: Diagnosis not present

## 2015-12-30 DIAGNOSIS — E039 Hypothyroidism, unspecified: Secondary | ICD-10-CM | POA: Insufficient documentation

## 2015-12-30 DIAGNOSIS — S42114A Nondisplaced fracture of body of scapula, right shoulder, initial encounter for closed fracture: Secondary | ICD-10-CM

## 2015-12-30 DIAGNOSIS — Y9389 Activity, other specified: Secondary | ICD-10-CM | POA: Diagnosis not present

## 2015-12-30 DIAGNOSIS — I5033 Acute on chronic diastolic (congestive) heart failure: Secondary | ICD-10-CM | POA: Insufficient documentation

## 2015-12-30 DIAGNOSIS — Z79899 Other long term (current) drug therapy: Secondary | ICD-10-CM | POA: Diagnosis not present

## 2015-12-30 DIAGNOSIS — Y92129 Unspecified place in nursing home as the place of occurrence of the external cause: Secondary | ICD-10-CM | POA: Insufficient documentation

## 2015-12-30 DIAGNOSIS — Z96642 Presence of left artificial hip joint: Secondary | ICD-10-CM | POA: Insufficient documentation

## 2015-12-30 DIAGNOSIS — Y999 Unspecified external cause status: Secondary | ICD-10-CM | POA: Diagnosis not present

## 2015-12-30 DIAGNOSIS — Z7901 Long term (current) use of anticoagulants: Secondary | ICD-10-CM | POA: Insufficient documentation

## 2015-12-30 DIAGNOSIS — Z950 Presence of cardiac pacemaker: Secondary | ICD-10-CM | POA: Insufficient documentation

## 2015-12-30 DIAGNOSIS — S4991XA Unspecified injury of right shoulder and upper arm, initial encounter: Secondary | ICD-10-CM | POA: Diagnosis present

## 2015-12-30 DIAGNOSIS — W1839XA Other fall on same level, initial encounter: Secondary | ICD-10-CM | POA: Diagnosis not present

## 2015-12-30 DIAGNOSIS — W19XXXA Unspecified fall, initial encounter: Secondary | ICD-10-CM

## 2015-12-30 LAB — CBC WITH DIFFERENTIAL/PLATELET
BASOS PCT: 0 %
Basophils Absolute: 0 10*3/uL (ref 0.0–0.1)
EOS ABS: 0.1 10*3/uL (ref 0.0–0.7)
Eosinophils Relative: 1 %
HEMATOCRIT: 38 % (ref 36.0–46.0)
HEMOGLOBIN: 11.8 g/dL — AB (ref 12.0–15.0)
LYMPHS ABS: 1.6 10*3/uL (ref 0.7–4.0)
Lymphocytes Relative: 15 %
MCH: 29.3 pg (ref 26.0–34.0)
MCHC: 31.1 g/dL (ref 30.0–36.0)
MCV: 94.3 fL (ref 78.0–100.0)
MONOS PCT: 6 %
Monocytes Absolute: 0.6 10*3/uL (ref 0.1–1.0)
NEUTROS ABS: 8 10*3/uL — AB (ref 1.7–7.7)
NEUTROS PCT: 78 %
Platelets: 334 10*3/uL (ref 150–400)
RBC: 4.03 MIL/uL (ref 3.87–5.11)
RDW: 14.7 % (ref 11.5–15.5)
WBC: 10.1 10*3/uL (ref 4.0–10.5)

## 2015-12-30 LAB — COMPREHENSIVE METABOLIC PANEL
ALBUMIN: 3.7 g/dL (ref 3.5–5.0)
ALK PHOS: 63 U/L (ref 38–126)
ALT: 15 U/L (ref 14–54)
AST: 21 U/L (ref 15–41)
Anion gap: 10 (ref 5–15)
BILIRUBIN TOTAL: 0.2 mg/dL — AB (ref 0.3–1.2)
BUN: 25 mg/dL — AB (ref 6–20)
CALCIUM: 8.9 mg/dL (ref 8.9–10.3)
CO2: 30 mmol/L (ref 22–32)
CREATININE: 1.32 mg/dL — AB (ref 0.44–1.00)
Chloride: 99 mmol/L — ABNORMAL LOW (ref 101–111)
GFR calc Af Amer: 38 mL/min — ABNORMAL LOW (ref >60)
GFR calc non Af Amer: 33 mL/min — ABNORMAL LOW (ref >60)
GLUCOSE: 94 mg/dL (ref 65–99)
Potassium: 3.9 mmol/L (ref 3.5–5.1)
SODIUM: 139 mmol/L (ref 135–145)
TOTAL PROTEIN: 6.5 g/dL (ref 6.5–8.1)

## 2015-12-30 NOTE — Discharge Instructions (Signed)
As discussed, it is important that you monitor your condition carefully, and do not hesitate to return here for concerning changes.  Otherwise be sure to follow-up with your primary care physician and our orthopedist.

## 2015-12-30 NOTE — ED Provider Notes (Signed)
[facility]EM[facility] DEPT Provider Note   CSN: [id number] Arrival date & time: 12/30/15  1439   By signing my name below, I, Freida Busmaniana Omoyeni, attest that this documentation has been prepared under the direction and in the presence of Gerhard Munchobert Ijeoma Loor, MD . Electronically Signed: Freida Busmaniana Omoyeni, Scribe. 12/30/2015. 4:51 PM.  History   Chief Complaint Chief Complaint  Patient presents with  . Fall    shoulder pain  . Cough     The history is provided by the patient. No language interpreter was used.   HPI Comments:  Tyson AliasCarmelita J Chastang is a 82 y.o. female who presents to the Emergency Department complaining of right shoulder pain x 2 days. She states she does not have pain at rest. She only has pain when ambulating or with certain movements. Pt kneeled down to pick up dimes 2 days ago but ended up on both knees and when she stood she had right shoulder pain. She reports associated mild generalized weakness.Denies fall and head injury. She also denies cough, fever, and nausea.   Past Medical History:  Diagnosis Date  . A-fib (HCC)   . Aortic stenosis, severe   . CHF (congestive heart failure) (HCC)   . GI bleed   . Hip fracture (HCC)   . Hyperlipemia   . Hypertension   . Hypothyroidism   . Osteoarthritis   . Osteoporosis   . Pacemaker   . Patella fracture    right  . Sick sinus syndrome (HCC)   . Tachypnea     Patient Active Problem List   Diagnosis Date Noted  . Anemia 08/26/2015  . Atherosclerosis of native arteries of the extremities with ulceration (HCC) 04/09/2015  . Acquired autoimmune hypothyroidism 12/28/2014  . Acute lower GI bleeding 06/28/2013  . Pulmonary hypertension 04/23/2013  . Diastolic CHF, acute on chronic (HCC) 04/23/2013  . Unspecified hypothyroidism 04/23/2013  . Tachypnea 08/15/2011  . Hypotension 08/12/2011  . Severe aortic stenosis 08/12/2011  . Hip fracture (HCC) 08/10/2011  . UTI (lower urinary tract infection) 08/10/2011  . Abdominal distention  08/10/2011  . Atrial fibrillation (HCC) 08/10/2011  . Murmur, cardiac 08/10/2011  . Atrial fibrillation with rapid ventricular response (HCC) 08/10/2011  . Chronic anticoagulation 08/10/2011    Past Surgical History:  Procedure Laterality Date  . HIP ARTHROPLASTY  08/11/2011   Procedure: ARTHROPLASTY BIPOLAR HIP;  Surgeon: Shelda PalMatthew D Olin, MD;  [facility];  Service: Orthopedics;  Laterality: Left;  . HUMERUS FRACTURE SURGERY    . PERIPHERAL VASCULAR CATHETERIZATION N/A 04/19/2015   Procedure: Abdominal Aortogram;  Surgeon: Fransisco HertzBrian L Chen, MD;  [facility];  Service: Cardiovascular;  Laterality: N/A;    OB History    No data available       Home Medications    Prior to Admission medications   Medication Sig Start Date End Date Taking? Authorizing Provider  acetaminophen (TYLENOL 8 HOUR) 650 MG CR tablet Take 2 tablets by mouth daily at bedtime    Historical Provider, MD  amiodarone (PACERONE) 200 MG tablet Take 200 mg by mouth daily.    Historical Provider, MD  denosumab (PROLIA) 60 MG/ML SOLN injection Inject 60 mg into the skin every 6 (six) months. Administer in upper arm, thigh, or abdomen    Historical Provider, MD  digoxin (LANOXIN) 0.125 MG tablet Take 0.0625 mg by mouth daily.    Historical Provider, MD  Docusate Sodium (COLACE PO) Take 1 tablet by mouth daily. Reported on 06/29/2015    Historical  Provider, MD  ferrous sulfate 220 (44 Fe) MG/5ML solution Take 5 mLs (220 mg total) by mouth 2 (two) times daily with a meal. 08/27/15   Rinaldo CloudMohan Harwani, MD  furosemide (LASIX) 40 MG tablet Take 40 mg alternating with 80 mg every other day 10/14/15   Duke SalviaSteven C Klein, MD  levothyroxine (SYNTHROID, LEVOTHROID) 100 MCG tablet Take 100 mcg by mouth daily.     Historical Provider, MD  metoprolol succinate (TOPROL-XL) 25 MG 24 hr tablet Take 25 mg by mouth daily.    Historical Provider, MD  Sennosides (SENNA LAXATIVE PO) Take 1 tablet by mouth daily. Reported on 06/29/2015     Historical Provider, MD  traZODone (DESYREL) 50 MG tablet Take 25 mg by mouth at bedtime.     Historical Provider, MD    Family History Family History  Problem Relation Age of Onset  . Bipolar disorder Mother     Social History Social History  Substance Use Topics  . Smoking status: Never Smoker  . Smokeless tobacco: Never Used  . Alcohol use 0.0 oz/week     Comment: once a week, on Sunday     Allergies   Patient has no known allergies.   Review of Systems Review of Systems  Constitutional:       Per HPI, otherwise negative  HENT:       Per HPI, otherwise negative  Respiratory:       Per HPI, otherwise negative  Cardiovascular:       Per HPI, otherwise negative  Gastrointestinal: Negative for vomiting.  Endocrine:       Negative aside from HPI  Genitourinary:       Neg aside from HPI   Musculoskeletal:       Per HPI, otherwise negative  Skin: Negative.   Neurological: Negative for syncope.     Physical Exam Updated Vital Signs BP 115/56 (BP Location: Right Arm)   Pulse (!) 59   Temp 97.5 F (36.4 C) (Oral)   Resp 18   Ht 5' 2.5" (1.588 m)   Wt 104 lb (47.2 kg)   SpO2 97%   BMI 18.72 kg/m   Physical Exam  Constitutional: She is oriented to person, place, and time. She appears well-developed and well-nourished. No distress.  HENT:  Head: Normocephalic and atraumatic.  Eyes: Conjunctivae and EOM are normal.  Cardiovascular: Normal rate and regular rhythm.   Murmur heard. Prominent aortic stenosis murmur   Pulmonary/Chest: Effort normal and breath sounds normal. No stridor. No respiratory distress.  Abdominal: She exhibits no distension.  Musculoskeletal: She exhibits no edema.  Bilateral knees and ankles nml  Right shoulder bony tenderness; no deformity   Neurological: She is alert and oriented to person, place, and time. No cranial nerve deficit.  Skin: Skin is warm and dry.  Psychiatric: She has a normal mood and affect.  Nursing note and  vitals reviewed.    ED Treatments / Results  DIAGNOSTIC STUDIES:  Oxygen Saturation is 97% on RA, normal by my interpretation.    COORDINATION OF CARE:  3:44 PM Discussed treatment plan with pt at bedside and pt agreed to plan.  Labs (all labs ordered are listed, but only abnormal results are displayed) Labs Reviewed  CBC WITH DIFFERENTIAL/PLATELET - Abnormal; Notable for the following:       Result Value   Hemoglobin 11.8 (*)    Neutro Abs 8.0 (*)    All other components within normal limits  COMPREHENSIVE METABOLIC PANEL - Abnormal; Notable  for the following:    Chloride 99 (*)    BUN 25 (*)    Creatinine, Ser 1.32 (*)    Total Bilirubin 0.2 (*)    GFR calc non Af Amer 33 (*)    GFR calc Af Amer 38 (*)    All other components within normal limits    EKG  EKG Interpretation  Date/Time:  Thursday December 30 2015 14:54:07 EST Ventricular Rate:  60 PR Interval:  326 QRS Duration: 104 QT Interval:  480 QTC Calculation: 480 R Axis:   -79 Text Interpretation:  Atrial-paced rhythm with prolonged AV conduction Artifact Abnormal ekg Confirmed by Gerhard Munch  MD [phone]) on 12/30/2015 5:12:49 PM       Radiology Dg Chest 2 View  Result Date: 12/30/2015 CLINICAL DATA:  82 year old who fell at the nursing home earlier today and complains of generalized right shoulder pain. Patient also complains of right-sided anterior chest pain for the past 3 days. Initial encounter. EXAM: CHEST  2 VIEW COMPARISON:  08/26/2015, 06/21/2014 and earlier. FINDINGS: AP sitting erect and lateral images were obtained. Cardiac silhouette moderately to markedly enlarged, unchanged. Left subclavian dual lead transvenous pacemaker with the lead tips at the expected location of the right atrial appendage and right ventricular apex, unchanged and intact. Thoracic aorta atherosclerotic, unchanged. Hilar and mediastinal contours otherwise unremarkable. Lungs clear. Bronchovascular markings normal.  Pulmonary vascularity normal. No visible pleural effusions. No pneumothorax. Apparent opacities over the medial aspects of both lungs can be explained by calcification involving the costal cartilages. IMPRESSION: 1. Stable cardiomegaly.  No acute cardiopulmonary disease. 2. Thoracic aortic atherosclerosis. Electronically Signed   By: Hulan Saas M.D.   On: 12/30/2015 17:14   Dg Shoulder Right  Result Date: 12/30/2015 CLINICAL DATA:  82 year old who fell at the nursing home earlier today and complains of generalized right shoulder pain. Patient also complains of right-sided anterior chest pain for the past 3 days. Initial encounter. EXAM: RIGHT SHOULDER - 2+ VIEW COMPARISON:  06/21/2014. FINDINGS: Acute nondisplaced fracture involving the inferior aspect of the scapula. No evidence of acute fracture elsewhere. 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 Narrowed subacromial space, unchanged since the prior examination. Glenohumeral joint intact. IMPRESSION: 1. Acute nondisplaced fracture involving the inferior aspect of the scapula. 2. 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 Electronically Signed   By: Hulan Saas M.D.   On: 12/30/2015 17:12    Procedures Procedures (including critical care time)  Medications Ordered in ED Medications - No data to display   Initial Impression / Assessment and Plan / ED Course  I have reviewed the triage vital signs and the nursing notes.  Pertinent labs & imaging results that were available during my care of the patient were reviewed by me and considered in my medical decision making (see chart for details).  Clinical Course    5:44 PM Pt reassessed and updated with XR results. We reviewed the x-ray together, and I demonstrated the scapular fracture. We discussed options for treatment including sling, but with  the patient's frail condition, high fall risk, patient will have analgesia, no sling, no narcotics. We discussed return precautions, tickly with concern for possible early pneumonia, though this seems unlikely, as well as the need to follow-up with primary care.   Final Clinical Impressions(s) / ED Diagnoses   Fall Scapular fracture  I personally performed the services described in this documentation,  which was scribed in my presence. The recorded information has been reviewed and is accurate.       Gerhard Munch, MD 12/30/15 1755

## 2015-12-30 NOTE — ED Triage Notes (Addendum)
[facility] after falling on R shoulder from a kneeling position 2 days ago.  Pain increased with movement.  RN also stated she heard crackles in her lungs.  Pt denies any sob, though states she feels more tired than normal.

## 2015-12-30 NOTE — ED Notes (Signed)
Pt returned from X-ray.  

## 2016-01-06 ENCOUNTER — Telehealth: Payer: Self-pay | Admitting: Endocrinology

## 2016-01-06 ENCOUNTER — Other Ambulatory Visit (INDEPENDENT_AMBULATORY_CARE_PROVIDER_SITE_OTHER): Payer: Medicare Other

## 2016-01-06 DIAGNOSIS — E038 Other specified hypothyroidism: Secondary | ICD-10-CM | POA: Diagnosis not present

## 2016-01-06 DIAGNOSIS — E063 Autoimmune thyroiditis: Secondary | ICD-10-CM

## 2016-01-06 DIAGNOSIS — M8580 Other specified disorders of bone density and structure, unspecified site: Secondary | ICD-10-CM

## 2016-01-06 LAB — BASIC METABOLIC PANEL
BUN: 28 mg/dL — AB (ref 6–23)
CHLORIDE: 101 meq/L (ref 96–112)
CO2: 32 meq/L (ref 19–32)
CREATININE: 1.21 mg/dL — AB (ref 0.40–1.20)
Calcium: 9.2 mg/dL (ref 8.4–10.5)
GFR: 43.92 mL/min — ABNORMAL LOW (ref >60.00)
GLUCOSE: 71 mg/dL (ref 70–99)
POTASSIUM: 3.4 meq/L — AB (ref 3.5–5.1)
Sodium: 142 mEq/L (ref 135–145)

## 2016-01-06 LAB — T4, FREE: Free T4: 1.4 ng/dL (ref 0.60–1.60)

## 2016-01-06 LAB — TSH: TSH: 3.13 u[IU]/mL (ref 0.35–4.50)

## 2016-01-06 NOTE — Telephone Encounter (Signed)
Pt is due for prolia, she has an appt with Dr. Lucianne MussKumar on 12/18 do you think we could get auth by then?

## 2016-01-06 NOTE — Telephone Encounter (Signed)
I have rec'd a letter from [employer] in ref to insurance verification and [name] does not have coverage for Prolia through her medical (Part B) coverage.  Please ask her to contact her pharmacy and have them to check coverage through her prescription coverage (Part D).  If you have further questions, please let me know.  Thank you!  Crystal CornfieldStephanie this was the most recent message [facility]. This was documented on 06/29/2015 and I cannot locate any other information on this.

## 2016-01-07 ENCOUNTER — Encounter (INDEPENDENT_AMBULATORY_CARE_PROVIDER_SITE_OTHER): Payer: Self-pay | Admitting: Orthopaedic Surgery

## 2016-01-07 ENCOUNTER — Ambulatory Visit (INDEPENDENT_AMBULATORY_CARE_PROVIDER_SITE_OTHER): Payer: Medicare Other | Admitting: Orthopaedic Surgery

## 2016-01-07 DIAGNOSIS — I7025 Atherosclerosis of native arteries of other extremities with ulceration: Secondary | ICD-10-CM | POA: Diagnosis not present

## 2016-01-07 DIAGNOSIS — S42114A Nondisplaced fracture of body of scapula, right shoulder, initial encounter for closed fracture: Secondary | ICD-10-CM

## 2016-01-07 NOTE — Progress Notes (Signed)
Office Visit Note   Patient: [name]           Date of Birth: 08/05/1918           MRN: [id number] Visit Date: 01/07/2016              Requested by: Rinaldo CloudMohan Harwani, MD [phone] [place], [place] [address] PCP: Rinaldo CloudHarwani, Mohan, MD   Assessment & Plan: Visit Diagnoses:  1. Closed nondisplaced fracture of body of right scapula, initial encounter     Plan: X-rays reviewed shows nondisplaced inferior angle scapular fracture. We will treat this nonoperatively. Activity as tolerated. Follow-up as needed.  Follow-Up Instructions: Return if symptoms worsen or fail to improve.   Orders:  No orders of the defined types were placed in this encounter.  No orders of the defined types were placed in this encounter.     Procedures: No procedures performed   Clinical Data: No additional findings.   Subjective: Chief Complaint  Patient presents with  . Right Shoulder - Pain, Fracture    Patient is 82 year old female comes in with undisplaced inferior scapula fracture from fall. She ambulates with a walker. She endorses pain over the scapula and referred pain shoulder.    Review of Systems   Objective: Vital Signs: There were no vitals taken for this visit.  Physical Exam  Ortho Exam Exam of the right shoulder shows exquisite tenderness over the inferior angle scapula. Her shoulder motion and sensation are grossly intact. Specialty Comments:  No specialty comments available.  Imaging: No results found.   PMFS History: Patient Active Problem List   Diagnosis Date Noted  . Closed nondisplaced fracture of body of right scapula 01/07/2016  . Anemia 08/26/2015  . Atherosclerosis of native arteries of the extremities with ulceration (HCC) 04/09/2015  . Acquired autoimmune hypothyroidism 12/28/2014  . Acute lower GI bleeding 06/28/2013  . Pulmonary hypertension 04/23/2013  . Diastolic CHF, acute on chronic (HCC) 04/23/2013  . Unspecified  hypothyroidism 04/23/2013  . Tachypnea 08/15/2011  . Hypotension 08/12/2011  . Severe aortic stenosis 08/12/2011  . Hip fracture (HCC) 08/10/2011  . UTI (lower urinary tract infection) 08/10/2011  . Abdominal distention 08/10/2011  . Atrial fibrillation (HCC) 08/10/2011  . Murmur, cardiac 08/10/2011  . Atrial fibrillation with rapid ventricular response (HCC) 08/10/2011  . Chronic anticoagulation 08/10/2011   Past Medical History:  Diagnosis Date  . A-fib (HCC)   . Aortic stenosis, severe   . CHF (congestive heart failure) (HCC)   . GI bleed   . Hip fracture (HCC)   . Hyperlipemia   . Hypertension   . Hypothyroidism   . Osteoarthritis   . Osteoporosis   . Pacemaker   . Patella fracture    right  . Sick sinus syndrome (HCC)   . Tachypnea     Family History  Problem Relation Age of Onset  . Bipolar disorder Mother     Past Surgical History:  Procedure Laterality Date  . HIP ARTHROPLASTY  08/11/2011   Procedure: ARTHROPLASTY BIPOLAR HIP;  Surgeon: Shelda PalMatthew D Olin, MD;  [facility];  Service: Orthopedics;  Laterality: Left;  . HUMERUS FRACTURE SURGERY    . PERIPHERAL VASCULAR CATHETERIZATION N/A 04/19/2015   Procedure: Abdominal Aortogram;  Surgeon: Fransisco HertzBrian L Chen, MD;  [facility];  Service: Cardiovascular;  Laterality: N/A;   Social History   Occupational History  . Not on file.   Social History Main Topics  . Smoking status:  Never Smoker  . Smokeless tobacco: Never Used  . Alcohol use 0.0 oz/week     Comment: once a week, on Sunday  . Drug use: No  . Sexual activity: Not on file

## 2016-01-10 ENCOUNTER — Encounter: Payer: Self-pay | Admitting: Endocrinology

## 2016-01-10 ENCOUNTER — Ambulatory Visit (INDEPENDENT_AMBULATORY_CARE_PROVIDER_SITE_OTHER): Payer: Medicare Other | Admitting: Endocrinology

## 2016-01-10 VITALS — BP 128/70 | HR 68 | Ht 63.0 in | Wt 108.0 lb

## 2016-01-10 DIAGNOSIS — I7025 Atherosclerosis of native arteries of other extremities with ulceration: Secondary | ICD-10-CM | POA: Diagnosis not present

## 2016-01-10 DIAGNOSIS — E038 Other specified hypothyroidism: Secondary | ICD-10-CM

## 2016-01-10 DIAGNOSIS — M8580 Other specified disorders of bone density and structure, unspecified site: Secondary | ICD-10-CM

## 2016-01-10 DIAGNOSIS — E559 Vitamin D deficiency, unspecified: Secondary | ICD-10-CM | POA: Diagnosis not present

## 2016-01-10 DIAGNOSIS — E063 Autoimmune thyroiditis: Secondary | ICD-10-CM

## 2016-01-10 NOTE — Progress Notes (Signed)
Patient ID: [name], female   DOB: 1918-05-10, 82 y.o.   MRN: [id number]           Chief complaint: Osteoporosis and low thyroid, follow-up visit  History of Present Illness:  PROBLEM 1:  She has a history of low trauma fracture and at least a 3 inch height loss  Hip fractures in 1990, 2013; femur, traumatic ?  2011; clavicle 05/2014, she had 3 compression vertebral fractures in 2012 secondary to a fall  She had a screening bone density done and this showed the following T-scores: Femoral neck: In 11/2009 was -2.3 and total left hip was -1.7 Spine: Non-measurable Forearm measured in 2014 showed T score -1.6  Previous treatment: Prolia, possibly got 2 injections in 2014 and may have had one in 2015, records not available Fosamax: Difficult to comply with this and did not continue Calcium supplements: Calmag Vitamin D supplements: D3, 5000 units 3 times a week now, her last level was 78   She has been on Prolia and got her last injection in 09/2014 Has been unable to get this because of insurance non- approval had She apparently had a hairline fracture of her scapula which is healing now  Calcium and renal function has been stable  Lab Results  Component Value Date   CREATININE 1.21 (H) 01/06/2016   BUN 28 (H) 01/06/2016   NA 142 01/06/2016   K 3.4 (L) 01/06/2016   CL 101 01/06/2016   CO2 32 01/06/2016      PROBLEM 2: HYPOTHYROIDISM  Patient is not a good historian but she thinks she was told to have hypothyroidism 30-40 years ago She thinks she was having symptoms of fatigue before she was started on thyroid hormones and she felt better with thyroid supplementation  On Synthroid 100 g daily On her last visit she was told to take a half tablet on Sundays instead of full tablet but she is not doing this Has a  fairly stable dose recently  She has not had any unusual fatigue and her TSH is again consistently normal   Lab Results  Component Value Date   TSH 3.13 01/06/2016   TSH 1.58 06/24/2015   TSH 2.81 12/28/2014   FREET4 1.40 01/06/2016   FREET4 1.82 (H) 06/24/2015   FREET4 1.91 (H) 12/28/2014      Past Medical History:  Diagnosis Date  . A-fib (HCC)   . Aortic stenosis, severe   . CHF (congestive heart failure) (HCC)   . GI bleed   . Hip fracture (HCC)   . Hyperlipemia   . Hypertension   . Hypothyroidism   . Osteoarthritis   . Osteoporosis   . Pacemaker   . Patella fracture    right  . Sick sinus syndrome (HCC)   . Tachypnea     Past Surgical History:  Procedure Laterality Date  . HIP ARTHROPLASTY  08/11/2011   Procedure: ARTHROPLASTY BIPOLAR HIP;  Surgeon: Shelda PalMatthew D Olin, MD;  [facility];  Service: Orthopedics;  Laterality: Left;  . HUMERUS FRACTURE SURGERY    . PERIPHERAL VASCULAR CATHETERIZATION N/A 04/19/2015   Procedure: Abdominal Aortogram;  Surgeon: Fransisco HertzBrian L Chen, MD;  [facility];  Service: Cardiovascular;  Laterality: N/A;    Family History  Problem Relation Age of Onset  . Bipolar disorder Mother     Social History:  reports that she has never smoked. She has never used smokeless tobacco. She reports that she drinks alcohol. She reports that  she does not use drugs.  Allergies: No Known Allergies  Allergies as of 01/10/2016   No Known Allergies     Medication List       Accurate as of 01/10/16  1:03 PM. Always use your most recent med list.          amiodarone 200 MG tablet Commonly known as:  PACERONE Take 200 mg by mouth daily.   COLACE PO Take 1 tablet by mouth daily. Reported on 06/29/2015   denosumab 60 MG/ML Soln injection Commonly known as:  PROLIA Inject 60 mg into the skin every 6 (six) months. Administer in upper arm, thigh, or abdomen   digoxin 0.125 MG tablet Commonly known as:  LANOXIN Take 0.0625 mg by mouth daily.   ferrous sulfate 220 (44 Fe) MG/5ML solution Take 5 mLs (220 mg total) by mouth 2 (two) times daily with a meal.   furosemide 40 MG  tablet Commonly known as:  LASIX Take 40 mg alternating with 80 mg every other day   levothyroxine 100 MCG tablet Commonly known as:  SYNTHROID, LEVOTHROID Take 100 mcg by mouth daily.   metoprolol succinate 25 MG 24 hr tablet Commonly known as:  TOPROL-XL Take 25 mg by mouth daily.   SENNA LAXATIVE PO Take 1 tablet by mouth daily. Reported on 06/29/2015   traZODone 50 MG tablet Commonly known as:  DESYREL Take 25 mg by mouth at bedtime.   TYLENOL 8 HOUR 650 MG CR tablet Generic drug:  acetaminophen Take 2 tablets by mouth daily at bedtime       ROS She has had long-standing problems with atrial fibrillation, some history of CHF Has been maintained on amiodarone on and off for the last few years    PHYSICAL EXAM:  There were no vitals taken for this visit.    ASSESSMENT:   OSTEOPENIA with history of multiple fractures in various sites since about 1990 and height loss She also is prone to fractures because of history of falls Has had adequate vitamin D replacement  HYPOTHYROIDISM, autoimmune, long-standing and usually well controlled with 100 g of levothyroxine daily TSH is quite normal and adequate for her age at 3.1   PLAN:   She does need to be on Prolia long-term and will begin to do insurance authorization If not able to get this will go for Reclast.  Discussed that this should be safe considering her renal function is adequate Continue vitamin D3, 5000 units 3 days a week and calcium supplement, recheck level on next visit  Follow-up in 6 months  Jaivyn Gulla 01/10/2016, 1:03 PM

## 2016-01-10 NOTE — Patient Instructions (Signed)
Restart potassium 

## 2016-01-13 ENCOUNTER — Telehealth: Payer: Self-pay | Admitting: Cardiology

## 2016-01-13 ENCOUNTER — Encounter: Payer: Medicare Other | Admitting: *Deleted

## 2016-01-13 NOTE — Telephone Encounter (Signed)
Spoke with Claris CheMargaret with [employer], obtained info on this patient, we are to refile the claim with the prolia portal and the pt will have a coverage determination complete.

## 2016-01-13 NOTE — Telephone Encounter (Signed)
Confirmed remote transmission w/ pt daughter in law.   

## 2016-01-14 ENCOUNTER — Encounter: Payer: Self-pay | Admitting: Cardiology

## 2016-01-25 ENCOUNTER — Telehealth: Payer: Self-pay | Admitting: Endocrinology

## 2016-01-26 ENCOUNTER — Ambulatory Visit (INDEPENDENT_AMBULATORY_CARE_PROVIDER_SITE_OTHER): Payer: Medicare Other

## 2016-01-26 DIAGNOSIS — M810 Age-related osteoporosis without current pathological fracture: Secondary | ICD-10-CM | POA: Diagnosis not present

## 2016-01-26 MED ORDER — DENOSUMAB 60 MG/ML ~~LOC~~ SOLN
60.0000 mg | Freq: Once | SUBCUTANEOUS | Status: AC
  Administered 2016-01-26: 60 mg via SUBCUTANEOUS

## 2016-05-14 ENCOUNTER — Emergency Department (HOSPITAL_COMMUNITY): Payer: Medicare Other

## 2016-05-14 ENCOUNTER — Encounter (HOSPITAL_COMMUNITY): Payer: Self-pay | Admitting: Emergency Medicine

## 2016-05-14 ENCOUNTER — Emergency Department (HOSPITAL_COMMUNITY)
Admission: EM | Admit: 2016-05-14 | Discharge: 2016-05-14 | Disposition: A | Discharge status: Home / Self Care | Payer: Medicare Other | Attending: Emergency Medicine | Admitting: Emergency Medicine

## 2016-05-14 DIAGNOSIS — I110 Hypertensive heart disease with heart failure: Secondary | ICD-10-CM | POA: Diagnosis not present

## 2016-05-14 DIAGNOSIS — E039 Hypothyroidism, unspecified: Secondary | ICD-10-CM | POA: Diagnosis not present

## 2016-05-14 DIAGNOSIS — Z96642 Presence of left artificial hip joint: Secondary | ICD-10-CM | POA: Diagnosis not present

## 2016-05-14 DIAGNOSIS — Z950 Presence of cardiac pacemaker: Secondary | ICD-10-CM | POA: Diagnosis not present

## 2016-05-14 DIAGNOSIS — I5033 Acute on chronic diastolic (congestive) heart failure: Secondary | ICD-10-CM | POA: Insufficient documentation

## 2016-05-14 DIAGNOSIS — R531 Weakness: Secondary | ICD-10-CM

## 2016-05-14 LAB — COMPREHENSIVE METABOLIC PANEL
ALT: 14 U/L (ref 14–54)
AST: 21 U/L (ref 15–41)
Albumin: 3 g/dL — ABNORMAL LOW (ref 3.5–5.0)
Alkaline Phosphatase: 62 U/L (ref 38–126)
Anion gap: 8 (ref 5–15)
BILIRUBIN TOTAL: 0.4 mg/dL (ref 0.3–1.2)
BUN: 20 mg/dL (ref 6–20)
CHLORIDE: 104 mmol/L (ref 101–111)
CO2: 27 mmol/L (ref 22–32)
CREATININE: 1.13 mg/dL — AB (ref 0.44–1.00)
Calcium: 8.3 mg/dL — ABNORMAL LOW (ref 8.9–10.3)
GFR, EST AFRICAN AMERICAN: 46 mL/min — AB (ref >60)
GFR, EST NON AFRICAN AMERICAN: 40 mL/min — AB (ref >60)
Glucose, Bld: 103 mg/dL — ABNORMAL HIGH (ref 65–99)
POTASSIUM: 3.6 mmol/L (ref 3.5–5.1)
Sodium: 139 mmol/L (ref 135–145)
TOTAL PROTEIN: 6.4 g/dL — AB (ref 6.5–8.1)

## 2016-05-14 LAB — URINALYSIS, ROUTINE W REFLEX MICROSCOPIC
Bilirubin Urine: NEGATIVE
GLUCOSE, UA: NEGATIVE mg/dL
KETONES UR: NEGATIVE mg/dL
LEUKOCYTES UA: NEGATIVE
NITRITE: NEGATIVE
PH: 6 (ref 5.0–8.0)
PROTEIN: NEGATIVE mg/dL
Specific Gravity, Urine: 1.008 (ref 1.005–1.030)

## 2016-05-14 LAB — CBC
HEMATOCRIT: 28.4 % — AB (ref 36.0–46.0)
Hemoglobin: 8.5 g/dL — ABNORMAL LOW (ref 12.0–15.0)
MCH: 26.5 pg (ref 26.0–34.0)
MCHC: 29.9 g/dL — ABNORMAL LOW (ref 30.0–36.0)
MCV: 88.5 fL (ref 78.0–100.0)
PLATELETS: 449 10*3/uL — AB (ref 150–400)
RBC: 3.21 MIL/uL — AB (ref 3.87–5.11)
RDW: 14.3 % (ref 11.5–15.5)
WBC: 13.9 10*3/uL — AB (ref 4.0–10.5)

## 2016-05-14 LAB — LIPASE, BLOOD: LIPASE: 26 U/L (ref 11–51)

## 2016-05-14 MED ORDER — SODIUM CHLORIDE 0.9 % IV BOLUS (SEPSIS)
1000.0000 mL | Freq: Once | INTRAVENOUS | Status: AC
  Administered 2016-05-14: 1000 mL via INTRAVENOUS

## 2016-05-14 NOTE — ED Notes (Signed)
Pt attempted to pee and unable

## 2016-05-14 NOTE — ED Notes (Signed)
Family at bedside. 

## 2016-05-14 NOTE — ED Provider Notes (Signed)
[facility]-EMERGENCY DEPT Provider Note   CSN: [id number] Arrival date & time: 05/14/16  1216     History   Chief Complaint Chief Complaint  Patient presents with  . Weakness  . Diarrhea  . Rectal Bleeding    HPI [name] is a 83 y.o. female.  HPI  Patient presents with her son who assists with the history of present illness. They note that over the past 4 days patient has had persistent weakness. Illness began with one day of diarrhea. At that point the patient was still taking stool softening regimen, and it was not until after about 2 days of symptoms, the patient stopped that. Since cessation of his medications, the patient has been taking her other medications regularly, but not stool softener. She now notes that her stool has returned to normal, though she does have blood on the edges, consistent with her history of hemorrhoids, prior bleeding. During this illness she has had persistent weakness. History of hypertension 3 days ago, but deferred recommendation for medical evaluation. Patient had fluid resuscitation, has had improved blood pressure, but persistent weakness per She denies focal pain anywhere.   Past Medical History:  Diagnosis Date  . A-fib (HCC)   . Aortic stenosis, severe   . CHF (congestive heart failure) (HCC)   . GI bleed   . Hip fracture (HCC)   . Hyperlipemia   . Hypertension   . Hypothyroidism   . Osteoarthritis   . Osteoporosis   . Pacemaker   . Patella fracture    right  . Sick sinus syndrome (HCC)   . Tachypnea     Patient Active Problem List   Diagnosis Date Noted  . Closed nondisplaced fracture of body of right scapula 01/07/2016  . Anemia 08/26/2015  . Atherosclerosis of native arteries of the extremities with ulceration (HCC) 04/09/2015  . Acquired autoimmune hypothyroidism 12/28/2014  . Acute lower GI bleeding 06/28/2013  . Pulmonary hypertension (HCC) 04/23/2013  . Diastolic CHF, acute on chronic (HCC) 04/23/2013    . Unspecified hypothyroidism 04/23/2013  . Tachypnea 08/15/2011  . Hypotension 08/12/2011  . Severe aortic stenosis 08/12/2011  . Hip fracture (HCC) 08/10/2011  . UTI (lower urinary tract infection) 08/10/2011  . Abdominal distention 08/10/2011  . Atrial fibrillation (HCC) 08/10/2011  . Murmur, cardiac 08/10/2011  . Atrial fibrillation with rapid ventricular response (HCC) 08/10/2011  . Chronic anticoagulation 08/10/2011    Past Surgical History:  Procedure Laterality Date  . HIP ARTHROPLASTY  08/11/2011   Procedure: ARTHROPLASTY BIPOLAR HIP;  Surgeon: Shelda Pal, MD;  Location: [facility] OR;  Service: Orthopedics;  Laterality: Left;  . HUMERUS FRACTURE SURGERY    . PERIPHERAL VASCULAR CATHETERIZATION N/A 04/19/2015   Procedure: Abdominal Aortogram;  Surgeon: Fransisco Hertz, MD;  Location: [facility] INVASIVE CV LAB;  Service: Cardiovascular;  Laterality: N/A;    OB History    No data available       Home Medications    Prior to Admission medications   Medication Sig Start Date End Date Taking? Authorizing Provider  acetaminophen (TYLENOL 8 HOUR) 650 MG CR tablet Take 2 tablets by mouth daily at bedtime    Historical Provider, MD  amiodarone (PACERONE) 200 MG tablet Take 200 mg by mouth daily.    Historical Provider, MD  denosumab (PROLIA) 60 MG/ML SOLN injection Inject 60 mg into the skin every 6 (six) months. Administer in upper arm, thigh, or abdomen    Historical Provider, MD  digoxin (LANOXIN) 0.125 MG tablet  Take 0.0625 mg by mouth daily.    Historical Provider, MD  Docusate Sodium (COLACE PO) Take 1 tablet by mouth daily. Reported on 06/29/2015    Historical Provider, MD  ferrous sulfate 220 (44 Fe) MG/5ML solution Take 5 mLs (220 mg total) by mouth 2 (two) times daily with a meal. 08/27/15   Rinaldo Cloud, MD  furosemide (LASIX) 40 MG tablet Take 40 mg alternating with 80 mg every other day 10/14/15   Duke Salvia, MD  levothyroxine (SYNTHROID, LEVOTHROID) 100 MCG tablet Take 100 mcg  by mouth daily.     Historical Provider, MD  metoprolol succinate (TOPROL-XL) 25 MG 24 hr tablet Take 25 mg by mouth daily.    Historical Provider, MD  Sennosides (SENNA LAXATIVE PO) Take 1 tablet by mouth daily. Reported on 06/29/2015    Historical Provider, MD  traZODone (DESYREL) 50 MG tablet Take 25 mg by mouth at bedtime.     Historical Provider, MD    Family History Family History  Problem Relation Age of Onset  . Bipolar disorder Mother     Social History Social History  Substance Use Topics  . Smoking status: Never Smoker  . Smokeless tobacco: Never Used  . Alcohol use 0.0 oz/week     Comment: once a week, on Sunday     Allergies   Patient has no known allergies.   Review of Systems Review of Systems  Constitutional:       Per HPI, otherwise negative  HENT:       Per HPI, otherwise negative  Respiratory:       Per HPI, otherwise negative  Cardiovascular:       Per HPI, otherwise negative  Gastrointestinal: Positive for blood in stool and nausea. Negative for abdominal pain and vomiting.  Endocrine:       Negative aside from HPI  Genitourinary:       Neg aside from HPI   Musculoskeletal:       Per HPI, otherwise negative  Skin: Negative.   Neurological: Positive for weakness. Negative for syncope.     Physical Exam Updated Vital Signs BP (!) 131/51   Pulse (!) 59   Temp 98.7 F (37.1 C) (Oral)   Resp 17   Ht  (1.626 m)   Wt 100 lb (45.4 kg)   SpO2 100%   BMI 17.16 kg/m   Physical Exam  Constitutional: She is oriented to person, place, and time. She has a sickly appearance. No distress.  HENT:  Head: Normocephalic and atraumatic.  Eyes: Conjunctivae and EOM are normal.  Cardiovascular: Normal rate and regular rhythm.   Murmur heard. Pulmonary/Chest: Effort normal and breath sounds normal. No stridor. No respiratory distress.  Pacer in place  Abdominal: She exhibits no distension. There is no tenderness. There is no rigidity, no guarding,  no tenderness at McBurney's point and negative Murphy's sign.  Musculoskeletal: She exhibits no edema.  Neurological: She is alert and oriented to person, place, and time. No cranial nerve deficit.  Skin: Skin is warm and dry.  Psychiatric: She has a normal mood and affect.  Nursing note and vitals reviewed.    ED Treatments / Results  Labs (all labs ordered are listed, but only abnormal results are displayed) Labs Reviewed  COMPREHENSIVE METABOLIC PANEL - Abnormal; Notable for the following:       Result Value   Glucose, Bld 103 (*)    Creatinine, Ser 1.13 (*)    Calcium 8.3 (*)  Total Protein 6.4 (*)    Albumin 3.0 (*)    GFR calc non Af Amer 40 (*)    GFR calc Af Amer 46 (*)    All other components within normal limits  CBC - Abnormal; Notable for the following:    WBC 13.9 (*)    RBC 3.21 (*)    Hemoglobin 8.5 (*)    HCT 28.4 (*)    MCHC 29.9 (*)    Platelets 449 (*)    All other components within normal limits  LIPASE, BLOOD  URINALYSIS, ROUTINE W REFLEX MICROSCOPIC    EKG  EKG Interpretation  Date/Time:  Sunday May 14 2016 13:43:39 EDT Ventricular Rate:  60 PR Interval:    QRS Duration: 115 QT Interval:  500 QTC Calculation: 500 R Axis:   -59 Text Interpretation:  aflutter with v-paced rhythm Abnormal ekg Confirmed by Gerhard Munch  MD [id number]) on 05/14/2016 2:08:46 PM       Radiology Dg Chest Port 1 View  Result Date: 05/14/2016 CLINICAL DATA:  Dove rib for the past 2 days. Some blood in the stools. Weakness. EXAM: PORTABLE CHEST 1 VIEW COMPARISON:  12/30/2015. FINDINGS: Stable enlarged cardiac silhouette and tortuous and calcified thoracic aorta. Stable left subclavian pacemaker leads. Stable left upper lobe calcified granuloma. Otherwise, clear lungs. Stable old, distal right clavicle fracture with nonunion and widening of the coracoclavicular distance. IMPRESSION: No acute abnormality.  Stable cardiomegaly. Electronically Signed   By: Beckie Salts  M.D.   On: 05/14/2016 14:50    Procedures Procedures (including critical care time)  Medications Ordered in ED Medications  sodium chloride 0.9 % bolus 1,000 mL (not administered)   4:01 PM Patient appears better, states that she feels better. All findings this far discussed with the patient and her son. Specifically we discussed the reassuring loculi panel, absence of pneumonia. With some suspicion for urinary tract infection given the patient's weakness, leukocytosis, patient is awaiting results of urinalysis, but has not yet provided a sample. Discharge home is anticipated, but patient needs this result prior to discharge.  Care signed out to Dr. Deretha Emory for UA f/u and re-check w anticipated D/C.  Initial Impression / Assessment and Plan / ED Course  I have reviewed the triage vital signs and the nursing notes.  Pertinent labs & imaging results that were available during my care of the patient were reviewed by me and considered in my medical decision making (see chart for details).   Final Clinical Impressions(s) / ED Diagnoses  Weakness   Gerhard Munch, MD 05/14/16 [phone]

## 2016-05-14 NOTE — Discharge Instructions (Signed)
As discussed, your evaluation today has been largely reassuring.  But, it is important that you monitor your condition carefully, and do not hesitate to return to the ED if you develop new, or concerning changes in your condition. ? ?Otherwise, please follow-up with your physician for appropriate ongoing care. ? ?

## 2016-05-14 NOTE — ED Triage Notes (Signed)
Son stated, she has had diarrhea for a couple days and lately its been with blood, Is weak and Im sure she is probably dehydrated. She did this last year.

## 2016-06-26 ENCOUNTER — Telehealth: Payer: Self-pay | Admitting: *Deleted

## 2016-06-26 NOTE — Telephone Encounter (Signed)
Information has been submitted to pts insurance for verification of benefits. Awaiting response for coverage  

## 2016-07-06 ENCOUNTER — Other Ambulatory Visit (INDEPENDENT_AMBULATORY_CARE_PROVIDER_SITE_OTHER): Payer: Medicare Other

## 2016-07-06 DIAGNOSIS — M8580 Other specified disorders of bone density and structure, unspecified site: Secondary | ICD-10-CM | POA: Diagnosis not present

## 2016-07-06 DIAGNOSIS — E559 Vitamin D deficiency, unspecified: Secondary | ICD-10-CM

## 2016-07-06 DIAGNOSIS — E063 Autoimmune thyroiditis: Secondary | ICD-10-CM | POA: Diagnosis not present

## 2016-07-06 LAB — COMPREHENSIVE METABOLIC PANEL
ALBUMIN: 3.1 g/dL — AB (ref 3.5–5.2)
ALK PHOS: 75 U/L (ref 39–117)
ALT: 15 U/L (ref 0–35)
AST: 17 U/L (ref 0–37)
BUN: 18 mg/dL (ref 6–23)
CALCIUM: 8.1 mg/dL — AB (ref 8.4–10.5)
CO2: 26 mEq/L (ref 19–32)
Chloride: 101 mEq/L (ref 96–112)
Creatinine, Ser: 1.02 mg/dL (ref 0.40–1.20)
GFR: 53.44 mL/min — AB (ref >60.00)
GLUCOSE: 88 mg/dL (ref 70–99)
POTASSIUM: 4.1 meq/L (ref 3.5–5.1)
Sodium: 136 mEq/L (ref 135–145)
TOTAL PROTEIN: 6 g/dL (ref 6.0–8.3)
Total Bilirubin: 0.3 mg/dL (ref 0.2–1.2)

## 2016-07-06 LAB — T4, FREE: FREE T4: 1.62 ng/dL — AB (ref 0.60–1.60)

## 2016-07-06 LAB — VITAMIN D 25 HYDROXY (VIT D DEFICIENCY, FRACTURES): VITD: 50.84 ng/mL (ref 30.00–100.00)

## 2016-07-06 LAB — TSH: TSH: 2 u[IU]/mL (ref 0.35–4.50)

## 2016-07-07 ENCOUNTER — Other Ambulatory Visit: Payer: Medicare Other

## 2016-07-07 NOTE — Telephone Encounter (Signed)
returning missed call RE prolia  -LL

## 2016-07-07 NOTE — Telephone Encounter (Signed)
Verification of benefits have been processed and an approval has been received for pts prolia injection. Pts estimated cost are appx $0. This is only an estimate and cannot be confirmed until benefits are paid. Please advise pt and schedule if needed. If scheduled, once the injection is received, pls contact me back with the date it was received so that I am able to update prolia folder. thanks   Injection can be scheduled on or after July 26, 2016  Lm on pts vm requesting a call back

## 2016-07-11 NOTE — Telephone Encounter (Signed)
Her labs are fine and she needs to be seen by her PCP.  If she does not want probably a then she will need to consider doing Reclast injection

## 2016-07-11 NOTE — Telephone Encounter (Signed)
Spoke to Mr. Alona BeneJoyce again and he is going to get the patient in to see her PCP he stated the patient is feeling ok but still has diarrhea now and than and he is afraid she will get dehydrated- he will call back and let us know what her PCP says

## 2016-07-11 NOTE — Telephone Encounter (Signed)
Spoke with Crystal BlinksJohn Henson the patients power of attorney- he states the patient does want the prolia injection and is scheduled to come in on 08/01/16 to receive this injection he also states that the patient is not feeling well lately and would like to know if anything in her recent blood work could indicate that there is a problem- patient states she has diarrhea and just does not feel well... Please advise

## 2016-07-16 ENCOUNTER — Emergency Department (HOSPITAL_COMMUNITY): Payer: Medicare Other

## 2016-07-16 ENCOUNTER — Encounter (HOSPITAL_COMMUNITY): Payer: Self-pay | Admitting: *Deleted

## 2016-07-16 ENCOUNTER — Inpatient Hospital Stay (HOSPITAL_COMMUNITY)
Admission: EM | Admit: 2016-07-16 | Discharge: 2016-07-21 | DRG: 871 | Disposition: A | Discharge status: Skilled Nursing Facility | Payer: Medicare Other | Attending: Cardiology | Admitting: Cardiology

## 2016-07-16 DIAGNOSIS — S80811A Abrasion, right lower leg, initial encounter: Secondary | ICD-10-CM | POA: Diagnosis present

## 2016-07-16 DIAGNOSIS — D638 Anemia in other chronic diseases classified elsewhere: Secondary | ICD-10-CM | POA: Diagnosis present

## 2016-07-16 DIAGNOSIS — E785 Hyperlipidemia, unspecified: Secondary | ICD-10-CM | POA: Diagnosis present

## 2016-07-16 DIAGNOSIS — I5022 Chronic systolic (congestive) heart failure: Secondary | ICD-10-CM | POA: Diagnosis present

## 2016-07-16 DIAGNOSIS — E43 Unspecified severe protein-calorie malnutrition: Secondary | ICD-10-CM | POA: Diagnosis present

## 2016-07-16 DIAGNOSIS — Z818 Family history of other mental and behavioral disorders: Secondary | ICD-10-CM

## 2016-07-16 DIAGNOSIS — I110 Hypertensive heart disease with heart failure: Secondary | ICD-10-CM | POA: Diagnosis present

## 2016-07-16 DIAGNOSIS — S80812A Abrasion, left lower leg, initial encounter: Secondary | ICD-10-CM | POA: Diagnosis present

## 2016-07-16 DIAGNOSIS — I959 Hypotension, unspecified: Secondary | ICD-10-CM | POA: Diagnosis present

## 2016-07-16 DIAGNOSIS — M8580 Other specified disorders of bone density and structure, unspecified site: Secondary | ICD-10-CM | POA: Diagnosis present

## 2016-07-16 DIAGNOSIS — E876 Hypokalemia: Secondary | ICD-10-CM | POA: Diagnosis present

## 2016-07-16 DIAGNOSIS — W19XXXA Unspecified fall, initial encounter: Secondary | ICD-10-CM | POA: Diagnosis present

## 2016-07-16 DIAGNOSIS — R6521 Severe sepsis with septic shock: Secondary | ICD-10-CM | POA: Diagnosis present

## 2016-07-16 DIAGNOSIS — I482 Chronic atrial fibrillation: Secondary | ICD-10-CM | POA: Diagnosis present

## 2016-07-16 DIAGNOSIS — J9811 Atelectasis: Secondary | ICD-10-CM | POA: Diagnosis present

## 2016-07-16 DIAGNOSIS — Z96642 Presence of left artificial hip joint: Secondary | ICD-10-CM | POA: Diagnosis present

## 2016-07-16 DIAGNOSIS — M1990 Unspecified osteoarthritis, unspecified site: Secondary | ICD-10-CM | POA: Diagnosis present

## 2016-07-16 DIAGNOSIS — I350 Nonrheumatic aortic (valve) stenosis: Secondary | ICD-10-CM | POA: Diagnosis present

## 2016-07-16 DIAGNOSIS — Z681 Body mass index (BMI) 19 or less, adult: Secondary | ICD-10-CM

## 2016-07-16 DIAGNOSIS — A419 Sepsis, unspecified organism: Principal | ICD-10-CM | POA: Diagnosis present

## 2016-07-16 DIAGNOSIS — M810 Age-related osteoporosis without current pathological fracture: Secondary | ICD-10-CM | POA: Diagnosis present

## 2016-07-16 DIAGNOSIS — E039 Hypothyroidism, unspecified: Secondary | ICD-10-CM | POA: Diagnosis present

## 2016-07-16 DIAGNOSIS — J189 Pneumonia, unspecified organism: Secondary | ICD-10-CM

## 2016-07-16 DIAGNOSIS — Z8673 Personal history of transient ischemic attack (TIA), and cerebral infarction without residual deficits: Secondary | ICD-10-CM | POA: Diagnosis not present

## 2016-07-16 LAB — COMPREHENSIVE METABOLIC PANEL
ALBUMIN: 2.5 g/dL — AB (ref 3.5–5.0)
ALT: 17 U/L (ref 14–54)
AST: 40 U/L (ref 15–41)
Alkaline Phosphatase: 71 U/L (ref 38–126)
Anion gap: 7 (ref 5–15)
BILIRUBIN TOTAL: 0.5 mg/dL (ref 0.3–1.2)
BUN: 18 mg/dL (ref 6–20)
CHLORIDE: 102 mmol/L (ref 101–111)
CO2: 26 mmol/L (ref 22–32)
CREATININE: 1.2 mg/dL — AB (ref 0.44–1.00)
Calcium: 7.9 mg/dL — ABNORMAL LOW (ref 8.9–10.3)
GFR calc Af Amer: 43 mL/min — ABNORMAL LOW (ref >60)
GFR, EST NON AFRICAN AMERICAN: 37 mL/min — AB (ref >60)
GLUCOSE: 109 mg/dL — AB (ref 65–99)
POTASSIUM: 4.6 mmol/L (ref 3.5–5.1)
Sodium: 135 mmol/L (ref 135–145)
TOTAL PROTEIN: 5.3 g/dL — AB (ref 6.5–8.1)

## 2016-07-16 LAB — URINALYSIS, ROUTINE W REFLEX MICROSCOPIC
Bilirubin Urine: NEGATIVE
GLUCOSE, UA: NEGATIVE mg/dL
Hgb urine dipstick: NEGATIVE
Ketones, ur: NEGATIVE mg/dL
Leukocytes, UA: NEGATIVE
Nitrite: NEGATIVE
Protein, ur: NEGATIVE mg/dL
SPECIFIC GRAVITY, URINE: 1.009 (ref 1.005–1.030)
pH: 5 (ref 5.0–8.0)

## 2016-07-16 LAB — CBC WITH DIFFERENTIAL/PLATELET
BASOS ABS: 0 10*3/uL (ref 0.0–0.1)
BASOS PCT: 0 %
EOS PCT: 1 %
Eosinophils Absolute: 0.2 10*3/uL (ref 0.0–0.7)
HCT: 29.3 % — ABNORMAL LOW (ref 36.0–46.0)
Hemoglobin: 8.5 g/dL — ABNORMAL LOW (ref 12.0–15.0)
Lymphocytes Relative: 6 %
Lymphs Abs: 1.3 10*3/uL (ref 0.7–4.0)
MCH: 22.7 pg — ABNORMAL LOW (ref 26.0–34.0)
MCHC: 29 g/dL — ABNORMAL LOW (ref 30.0–36.0)
MCV: 78.3 fL (ref 78.0–100.0)
MONO ABS: 1.5 10*3/uL — AB (ref 0.1–1.0)
Monocytes Relative: 7 %
Neutro Abs: 17.8 10*3/uL — ABNORMAL HIGH (ref 1.7–7.7)
Neutrophils Relative %: 86 %
PLATELETS: 476 10*3/uL — AB (ref 150–400)
RBC: 3.74 MIL/uL — ABNORMAL LOW (ref 3.87–5.11)
RDW: 14.7 % (ref 11.5–15.5)
WBC: 20.8 10*3/uL — ABNORMAL HIGH (ref 4.0–10.5)

## 2016-07-16 LAB — I-STAT CG4 LACTIC ACID, ED: Lactic Acid, Venous: 2.36 mmol/L (ref 0.5–1.9)

## 2016-07-16 MED ORDER — VANCOMYCIN HCL IN DEXTROSE 1-5 GM/200ML-% IV SOLN
1000.0000 mg | Freq: Once | INTRAVENOUS | Status: DC
  Filled 2016-07-16: qty 200

## 2016-07-16 MED ORDER — ACETAMINOPHEN 325 MG PO TABS
650.0000 mg | ORAL_TABLET | Freq: Three times a day (TID) | ORAL | Status: DC
  Administered 2016-07-17 – 2016-07-21 (×13): 650 mg via ORAL
  Filled 2016-07-16 (×12): qty 2

## 2016-07-16 MED ORDER — FERROUS SULFATE 300 (60 FE) MG/5ML PO SYRP
300.0000 mg | ORAL_SOLUTION | Freq: Two times a day (BID) | ORAL | Status: DC
  Administered 2016-07-17 – 2016-07-21 (×10): 300 mg via ORAL
  Filled 2016-07-16 (×11): qty 5

## 2016-07-16 MED ORDER — TRAZODONE HCL 50 MG PO TABS
50.0000 mg | ORAL_TABLET | Freq: Every day | ORAL | Status: DC
  Administered 2016-07-17 – 2016-07-20 (×4): 50 mg via ORAL
  Filled 2016-07-16 (×4): qty 1

## 2016-07-16 MED ORDER — PIPERACILLIN-TAZOBACTAM 3.375 G IVPB
3.3750 g | Freq: Three times a day (TID) | INTRAVENOUS | Status: DC
  Administered 2016-07-17 – 2016-07-19 (×7): 3.375 g via INTRAVENOUS
  Filled 2016-07-16 (×8): qty 50

## 2016-07-16 MED ORDER — LEVOTHYROXINE SODIUM 100 MCG PO TABS
100.0000 ug | ORAL_TABLET | Freq: Every day | ORAL | Status: DC
  Administered 2016-07-17 – 2016-07-21 (×5): 100 ug via ORAL
  Filled 2016-07-16 (×5): qty 1

## 2016-07-16 MED ORDER — SODIUM CHLORIDE 0.9 % IV SOLN
INTRAVENOUS | Status: DC
  Administered 2016-07-17: 02:00:00 via INTRAVENOUS

## 2016-07-16 MED ORDER — DIGOXIN 125 MCG PO TABS
0.0625 mg | ORAL_TABLET | Freq: Every day | ORAL | Status: DC
  Administered 2016-07-17 – 2016-07-21 (×5): 0.0625 mg via ORAL
  Filled 2016-07-16 (×5): qty 1

## 2016-07-16 MED ORDER — VANCOMYCIN HCL IN DEXTROSE 750-5 MG/150ML-% IV SOLN
750.0000 mg | Freq: Once | INTRAVENOUS | Status: AC
  Administered 2016-07-16: 750 mg via INTRAVENOUS
  Filled 2016-07-16: qty 150

## 2016-07-16 MED ORDER — PIPERACILLIN-TAZOBACTAM 3.375 G IVPB 30 MIN
3.3750 g | Freq: Once | INTRAVENOUS | Status: AC
  Administered 2016-07-16: 3.375 g via INTRAVENOUS
  Filled 2016-07-16: qty 50

## 2016-07-16 MED ORDER — ENOXAPARIN SODIUM 30 MG/0.3ML ~~LOC~~ SOLN
30.0000 mg | SUBCUTANEOUS | Status: DC
  Administered 2016-07-17 – 2016-07-21 (×5): 30 mg via SUBCUTANEOUS
  Filled 2016-07-16 (×5): qty 0.3

## 2016-07-16 MED ORDER — VANCOMYCIN HCL 500 MG IV SOLR
500.0000 mg | INTRAVENOUS | Status: DC
  Filled 2016-07-16: qty 500

## 2016-07-16 MED ORDER — SODIUM CHLORIDE 0.9 % IV BOLUS (SEPSIS)
500.0000 mL | Freq: Once | INTRAVENOUS | Status: AC
  Administered 2016-07-16: 500 mL via INTRAVENOUS

## 2016-07-16 MED ORDER — SODIUM CHLORIDE 0.9 % IV BOLUS (SEPSIS)
1000.0000 mL | Freq: Once | INTRAVENOUS | Status: AC
  Administered 2016-07-16: 1000 mL via INTRAVENOUS

## 2016-07-16 NOTE — ED Notes (Signed)
Dr Ethelda ChickJacubowitz given a copy of lactic acid results 2.36

## 2016-07-16 NOTE — ED Triage Notes (Signed)
Pts from [facility]. Had fall today, family states difficulty ambulating today and decreased appetite. Has skin tear to right lower leg. Pt is febrile at triage.

## 2016-07-16 NOTE — H&P (Signed)
[name] is an 83 y.o. female.   Chief Complaint: Fall. Diarrhea off and on for one week HPI: Patient is 83 year old female with past medical history significant for multiple medical problems i.e. severe aortic stenosis, hypertension, congestive heart failure secondary to depressed LV systolic function, chronic atrial fibrillation, hypertension, hyperlipidemia, peripheral vascular disease, osteoarthritis/osteopenia, sick sinus syndrome history of tachybradycardia syndrome status post permanent pacemaker, history of thalamic lacunar infarct in the past history of diverticular bleed in the past anemia of chronic disease, wrist and of habits foot came to the ER following the fall sustaining abrasion over right lower leg. As per son while walking to the bathroom using her walker and a fall and sustaining injury to right leg. Patient has difficulty ambulating so decided to come to ED. Patient denies any chest pain or shortness of breath. Denies palpitation lightheadedness or syncope prior to the fall. Also complains of diarrhea off and on for last few days. Denies any fever or chills. Denies any recent antibiotic use. A shows noted to have high-grade fever associated with marked leukocytosis, And was noted to be hypotensive. Past Medical History:  Diagnosis Date  . A-fib ([place])   . Aortic stenosis, severe   . CHF (congestive heart failure) ([place])   . GI bleed   . Hip fracture ([place])   . Hyperlipemia   . Hypertension   . Hypothyroidism   . Osteoarthritis   . Osteoporosis   . Pacemaker   . Patella fracture    right  . Sick sinus syndrome ([place])   . Tachypnea     Past Surgical History:  Procedure Laterality Date  . HIP ARTHROPLASTY  08/11/2011   Procedure: ARTHROPLASTY BIPOLAR HIP;  Surgeon: Mauri Pole, MD;  Location: [place];  Service: Orthopedics;  Laterality: Left;  . HUMERUS FRACTURE SURGERY    . PERIPHERAL VASCULAR CATHETERIZATION N/A 04/19/2015   Procedure: Abdominal Aortogram;   Surgeon: Conrad Waverly, MD;  Location: [place] CV LAB;  Service: Cardiovascular;  Laterality: N/A;    Family History  Problem Relation Age of Onset  . Bipolar disorder Mother    Social History:  reports that she has never smoked. She has never used smokeless tobacco. She reports that she drinks alcohol. She reports that she does not use drugs.  Allergies: No Known Allergies   (Not in a hospital admission)  Results for orders placed or performed during the hospital encounter of 07/16/16 (from the past 48 hour(s))  Urinalysis, Routine w reflex microscopic     Status: None   Collection Time: 07/16/16  6:42 PM  Result Value Ref Range   Color, Urine YELLOW YELLOW   APPearance CLEAR CLEAR   Specific Gravity, Urine 1.009 1.005 - 1.030   pH 5.0 5.0 - 8.0   Glucose, UA NEGATIVE NEGATIVE mg/dL   Hgb urine dipstick NEGATIVE NEGATIVE   Bilirubin Urine NEGATIVE NEGATIVE   Ketones, ur NEGATIVE NEGATIVE mg/dL   Protein, ur NEGATIVE NEGATIVE mg/dL   Nitrite NEGATIVE NEGATIVE   Leukocytes, UA NEGATIVE NEGATIVE  CBC with Differential/Platelet     Status: Abnormal   Collection Time: 07/16/16  7:15 PM  Result Value Ref Range   WBC 20.8 (H) 4.0 - 10.5 K/uL   RBC 3.74 (L) 3.87 - 5.11 MIL/uL   Hemoglobin 8.5 (L) 12.0 - 15.0 g/dL   HCT 29.3 (L) 36.0 - 46.0 %   MCV 78.3 78.0 - 100.0 fL   MCH 22.7 (L) 26.0 - 34.0 pg   MCHC  29.0 (L) 30.0 - 36.0 g/dL   RDW 14.7 11.5 - 15.5 %   Platelets 476 (H) 150 - 400 K/uL   Neutrophils Relative % 86 %   Neutro Abs 17.8 (H) 1.7 - 7.7 K/uL   Lymphocytes Relative 6 %   Lymphs Abs 1.3 0.7 - 4.0 K/uL   Monocytes Relative 7 %   Monocytes Absolute 1.5 (H) 0.1 - 1.0 K/uL   Eosinophils Relative 1 %   Eosinophils Absolute 0.2 0.0 - 0.7 K/uL   Basophils Relative 0 %   Basophils Absolute 0.0 0.0 - 0.1 K/uL  Comprehensive metabolic panel     Status: Abnormal   Collection Time: 07/16/16  7:45 PM  Result Value Ref Range   Sodium 135 135 - 145 mmol/L   Potassium  4.6 3.5 - 5.1 mmol/L   Chloride 102 101 - 111 mmol/L   CO2 26 22 - 32 mmol/L   Glucose, Bld 109 (H) 65 - 99 mg/dL   BUN 18 6 - 20 mg/dL   Creatinine, Ser 1.20 (H) 0.44 - 1.00 mg/dL   Calcium 7.9 (L) 8.9 - 10.3 mg/dL   Total Protein 5.3 (L) 6.5 - 8.1 g/dL   Albumin 2.5 (L) 3.5 - 5.0 g/dL   AST 40 15 - 41 U/L   ALT 17 14 - 54 U/L   Alkaline Phosphatase 71 38 - 126 U/L   Total Bilirubin 0.5 0.3 - 1.2 mg/dL   GFR calc non Af Amer 37 (L) >60 mL/min   GFR calc Af Amer 43 (L) >60 mL/min    Comment: (NOTE) The eGFR has been calculated using the CKD EPI equation. This calculation has not been validated in all clinical situations. eGFR's persistently <60 mL/min signify possible Chronic Kidney Disease.    Anion gap 7 5 - 15  I-Stat CG4 Lactic Acid, ED     Status: Abnormal   Collection Time: 07/16/16  8:01 PM  Result Value Ref Range   Lactic Acid, Venous 2.36 (HH) 0.5 - 1.9 mmol/L   Comment NOTIFIED PHYSICIAN    Dg Chest 2 View  Result Date: 07/16/2016 CLINICAL DATA:  Pain after fall. EXAM: CHEST  2 VIEW COMPARISON:  May 14, 2016 FINDINGS: No pneumothorax. The cardiomediastinal silhouette is stable. Stable pacemaker. Minimal opacity in the right mid lung is likely atelectasis. No acute infiltrate. The nodule seen on the lateral view is calcified and was seen previously in the left upper lobe. Compression fractures of the upper lumbar spine are unchanged since December 2017. No new compression fractures identified. IMPRESSION: No suspicious acute abnormalities. Electronically Signed   By: Dorise Bullion III M.D   On: 07/16/2016 19:17   Dg Tibia/fibula Right  Result Date: 07/16/2016 CLINICAL DATA:  Pain after fall EXAM: RIGHT TIBIA AND FIBULA - 2 VIEW COMPARISON:  None. FINDINGS: Healed fractures of the proximal fibular and distal tibial diaphyses. Hardware in the proximal tibia is identified. Degenerative changes seen in the knees, particularly medially. No acute fractures. Vascular  calcifications noted. No foreign bodies in the region of reported laceration. IMPRESSION: No acute abnormality. Electronically Signed   By: Dorise Bullion III M.D   On: 07/16/2016 19:19    Review of Systems  Constitutional: Positive for malaise/fatigue.  HENT: Positive for hearing loss.   Respiratory: Negative for cough and shortness of breath.   Cardiovascular: Positive for leg swelling. Negative for chest pain.  Gastrointestinal: Negative for nausea and vomiting.  Genitourinary: Negative for dysuria, frequency and urgency.  Neurological: Positive  for weakness. Negative for dizziness.    Blood pressure (!) 89/51, pulse 81, temperature (!) 101.6 F (38.7 C), temperature source Oral, resp. rate 16, height 5' (1.524 m), weight 45.4 kg (100 lb), SpO2 99 %. Physical Exam  Eyes: Conjunctivae are normal. Left eye exhibits no discharge. No scleral icterus.  Neck: Normal range of motion. Neck supple. No JVD present. No tracheal deviation present. No thyromegaly present.  Cardiovascular:  Regular rate and rhythm 3/6 systolic murmur noted  Respiratory:  Bibasilar rhonchi and faint rales noted  GI: Soft. Bowel sounds are normal. She exhibits no distension. There is no tenderness. There is no rebound and no guarding.  Musculoskeletal:  No clubbing cyanosis 1+ edema noted right leg more than left leg. 4 x 4 cm abrasion noted right lower leg.  Neurological: She is alert.     Assessment/Plan Status post fall sustaining abrasions over right leg rule out a fracture Septic shock rule out bilateral pneumonia Severe aortic stenosis Hypertension History of congestive heart failure secondary to depressed LV systolic function Hypothyroidism Osteoarthritis Osteopenia History of diverticular bleed in the past Anemia of chronic disease History of compression fracture of the spine in the past secondary to fall History of thalamic lacunar infarct in the past Severe protein calorie  malnutrition Plan As per orders  Charolette Forward, MD 07/16/2016, 9:19 PM

## 2016-07-16 NOTE — ED Provider Notes (Signed)
[facility]-EMERGENCY DEPT Provider Note   CSN: [id number] Arrival date & time: 07/16/16  1740     History   Chief Complaint Chief Complaint  Patient presents with  . Fall    HPI [name] is a 83 y.o. female.History is obtained from patient's son and from patient. Patient reportedly fell while using her walker today injuring her right leg. She offers no complaint. She denies cough denies fever denies other associated symptoms. No treatment prior to coming here  HPI  Past Medical History:  Diagnosis Date  . A-fib (HCC)   . Aortic stenosis, severe   . CHF (congestive heart failure) (HCC)   . GI bleed   . Hip fracture (HCC)   . Hyperlipemia   . Hypertension   . Hypothyroidism   . Osteoarthritis   . Osteoporosis   . Pacemaker   . Patella fracture    right  . Sick sinus syndrome (HCC)   . Tachypnea     Patient Active Problem List   Diagnosis Date Noted  . Closed nondisplaced fracture of body of right scapula 01/07/2016  . Anemia 08/26/2015  . Atherosclerosis of native arteries of the extremities with ulceration (HCC) 04/09/2015  . Acquired autoimmune hypothyroidism 12/28/2014  . Acute lower GI bleeding 06/28/2013  . Pulmonary hypertension (HCC) 04/23/2013  . Diastolic CHF, acute on chronic (HCC) 04/23/2013  . Unspecified hypothyroidism 04/23/2013  . Tachypnea 08/15/2011  . Hypotension 08/12/2011  . Severe aortic stenosis 08/12/2011  . Hip fracture (HCC) 08/10/2011  . UTI (lower urinary tract infection) 08/10/2011  . Abdominal distention 08/10/2011  . Atrial fibrillation (HCC) 08/10/2011  . Murmur, cardiac 08/10/2011  . Atrial fibrillation with rapid ventricular response (HCC) 08/10/2011  . Chronic anticoagulation 08/10/2011    Past Surgical History:  Procedure Laterality Date  . HIP ARTHROPLASTY  08/11/2011   Procedure: ARTHROPLASTY BIPOLAR HIP;  Surgeon: Shelda Pal, MD;  Location: [facility];  Service: Orthopedics;  Laterality: Left;  . HUMERUS FRACTURE  SURGERY    . PERIPHERAL VASCULAR CATHETERIZATION N/A 04/19/2015   Procedure: Abdominal Aortogram;  Surgeon: Fransisco Hertz, MD;  Location: [facility] INVASIVE CV LAB;  Service: Cardiovascular;  Laterality: N/A;    OB History    No data available       Home Medications    Prior to Admission medications   Medication Sig Start Date End Date Taking? Authorizing Provider  acetaminophen (TYLENOL 8 HOUR) 650 MG CR tablet Take 2 tablets by mouth daily at bedtime    [provider]  amiodarone (PACERONE) 200 MG tablet Take 200 mg by mouth daily.    [provider]  denosumab (PROLIA) 60 MG/ML SOLN injection Inject 60 mg into the skin every 6 (six) months. Administer in upper arm, thigh, or abdomen    [provider]  digoxin (LANOXIN) 0.125 MG tablet Take 0.0625 mg by mouth daily.    [provider]  Docusate Sodium (COLACE PO) Take 1 tablet by mouth daily. Reported on 06/29/2015    [provider]  ferrous sulfate 220 (44 Fe) MG/5ML solution Take 5 mLs (220 mg total) by mouth 2 (two) times daily with a meal. 08/27/15   Rinaldo Cloud, MD  furosemide (LASIX) 40 MG tablet Take 40 mg alternating with 80 mg every other day 10/14/15   Duke Salvia, MD  levothyroxine (SYNTHROID, LEVOTHROID) 100 MCG tablet Take 100 mcg by mouth daily.     [provider]  metoprolol succinate (TOPROL-XL) 25 MG 24 hr  tablet Take 25 mg by mouth daily.    [provider]  Sennosides (SENNA LAXATIVE PO) Take 1 tablet by mouth daily. Reported on 06/29/2015    [provider]  traZODone (DESYREL) 50 MG tablet Take 25 mg by mouth at bedtime.     [provider]    Family History Family History  Problem Relation Age of Onset  . Bipolar disorder Mother     Social History Social History  Substance Use Topics  . Smoking status: Never Smoker  . Smokeless tobacco: Never Used  . Alcohol use 0.0 oz/week     Comment: once a week, on Sunday     Allergies     Patient has no known allergies.   Review of Systems Review of Systems  Constitutional: Negative.   HENT: Negative.   Respiratory: Negative.   Cardiovascular: Negative.   Gastrointestinal: Negative.   Musculoskeletal: Positive for gait problem.  Skin: Positive for wound.       Wound on right shin. Chronic wound on left lower leg  Psychiatric/Behavioral: Negative.   All other systems reviewed and are negative.    Physical Exam Updated Vital Signs BP (!) 101/50   Pulse 92   Temp (!) 101.6 F (38.7 C) (Oral)   Resp 16   Ht 5' (1.524 m)   Wt 45.4 kg (100 lb)   SpO2 100%   BMI 19.53 kg/m   Physical Exam  Constitutional:  Chronically ill-appearing, frail  HENT:  Head: Normocephalic and atraumatic.  Mucous membranes dry  Eyes: Conjunctivae are normal. Pupils are equal, round, and reactive to light.  Neck: Neck supple. No tracheal deviation present. No thyromegaly present.  Cardiovascular: Normal rate and regular rhythm.   Murmur heard. Systolic murmur  Pulmonary/Chest: Effort normal and breath sounds normal.  Abdominal: Soft. Bowel sounds are normal. She exhibits no distension. There is no tenderness.  Musculoskeletal: Normal range of motion. She exhibits no edema or tenderness.  [employer] spine nontender. Pelvis stable nontender. Right lower extremity there is approximate 5 cm x 5 stimulus skin tear along the shin with corresponding tenderness and swelling. Left lower extremity there is a chronic a dime-sized wound at lateral aspect of lower leg. With minimal drainage on bandage. No redness no swelling no tenderness. All other extremity is a contusion abrasion or tenderness neurovascularly intact  Neurological: She is alert. Coordination normal.  Skin: Skin is warm and dry. No rash noted.  Psychiatric: She has a normal mood and affect.  Nursing note and vitals reviewed.    ED Treatments / Results  Labs (all labs ordered are listed, but only abnormal results are  displayed) Labs Reviewed  CULTURE, BLOOD (ROUTINE X 2)  CULTURE, BLOOD (ROUTINE X 2)  COMPREHENSIVE METABOLIC PANEL  CBC WITH DIFFERENTIAL/PLATELET  URINALYSIS, ROUTINE W REFLEX MICROSCOPIC  I-STAT CG4 LACTIC ACID, ED   Code sepsis called, intravenous fluid bolus and broad-spectrum intravenous antibiotic ordered as patient noted to be hypotensive with MAP less than 65. And profound leukocytosis. Dr.Harwani consulted and came to emergency department and arranged for admission.  At 9 PM patient appears to resting comfortably and denies complaint she is alert and awake and appears in no distress EKG  EKG Interpretation  Date/Time:  Sunday July 16 2016 21:20:16 EDT Ventricular Rate:  80 PR Interval:    QRS Duration: 156 QT Interval:  433 QTC Calculation: 500 R Axis:   -79 Text Interpretation:  Atrial flutter Nonspecific IVCD with LAD LVH with secondary repolarization abnormality Inferior  infarct, acute Anterior infarct, acute (LAD) Lateral leads are also involved Non-specific intra-ventricular conduction delay New since previous tracing Confirmed by Ely, Doreatha Martin [phone]) on 07/16/2016 9:29:17 PM      X-rays viewed by me Radiology No results found. Results for orders placed or performed during the hospital encounter of 07/16/16  CBC with Differential/Platelet  Result Value Ref Range   WBC 20.8 (H) 4.0 - 10.5 K/uL   RBC 3.74 (L) 3.87 - 5.11 MIL/uL   Hemoglobin 8.5 (L) 12.0 - 15.0 g/dL   HCT 60.4 (L) 54.0 - 98.1 %   MCV 78.3 78.0 - 100.0 fL   MCH 22.7 (L) 26.0 - 34.0 pg   MCHC 29.0 (L) 30.0 - 36.0 g/dL   RDW 19.1 47.8 - 29.5 %   Platelets 476 (H) 150 - 400 K/uL   Neutrophils Relative % 86 %   Neutro Abs 17.8 (H) 1.7 - 7.7 K/uL   Lymphocytes Relative 6 %   Lymphs Abs 1.3 0.7 - 4.0 K/uL   Monocytes Relative 7 %   Monocytes Absolute 1.5 (H) 0.1 - 1.0 K/uL   Eosinophils Relative 1 %   Eosinophils Absolute 0.2 0.0 - 0.7 K/uL   Basophils Relative 0 %   Basophils Absolute 0.0 0.0  - 0.1 K/uL  Urinalysis, Routine w reflex microscopic  Result Value Ref Range   Color, Urine YELLOW YELLOW   APPearance CLEAR CLEAR   Specific Gravity, Urine 1.009 1.005 - 1.030   pH 5.0 5.0 - 8.0   Glucose, UA NEGATIVE NEGATIVE mg/dL   Hgb urine dipstick NEGATIVE NEGATIVE   Bilirubin Urine NEGATIVE NEGATIVE   Ketones, ur NEGATIVE NEGATIVE mg/dL   Protein, ur NEGATIVE NEGATIVE mg/dL   Nitrite NEGATIVE NEGATIVE   Leukocytes, UA NEGATIVE NEGATIVE  Comprehensive metabolic panel  Result Value Ref Range   Sodium 135 135 - 145 mmol/L   Potassium 4.6 3.5 - 5.1 mmol/L   Chloride 102 101 - 111 mmol/L   CO2 26 22 - 32 mmol/L   Glucose, Bld 109 (H) 65 - 99 mg/dL   BUN 18 6 - 20 mg/dL   Creatinine, Ser 6.21 (H) 0.44 - 1.00 mg/dL   Calcium 7.9 (L) 8.9 - 10.3 mg/dL   Total Protein 5.3 (L) 6.5 - 8.1 g/dL   Albumin 2.5 (L) 3.5 - 5.0 g/dL   AST 40 15 - 41 U/L   ALT 17 14 - 54 U/L   Alkaline Phosphatase 71 38 - 126 U/L   Total Bilirubin 0.5 0.3 - 1.2 mg/dL   GFR calc non Af Amer 37 (L) >60 mL/min   GFR calc Af Amer 43 (L) >60 mL/min   Anion gap 7 5 - 15  I-Stat CG4 Lactic Acid, ED  Result Value Ref Range   Lactic Acid, Venous 2.36 (HH) 0.5 - 1.9 mmol/L   Comment NOTIFIED PHYSICIAN    Dg Chest 2 View  Result Date: 07/16/2016 CLINICAL DATA:  Pain after fall. EXAM: CHEST  2 VIEW COMPARISON:  May 14, 2016 FINDINGS: No pneumothorax. The cardiomediastinal silhouette is stable. Stable pacemaker. Minimal opacity in the right mid lung is likely atelectasis. No acute infiltrate. The nodule seen on the lateral view is calcified and was seen previously in the left upper lobe. Compression fractures of the upper lumbar spine are unchanged since December 2017. No new compression fractures identified. IMPRESSION: No suspicious acute abnormalities. Electronically Signed   By: Gerome Laira Penninger III M.D   On: 07/16/2016 19:17   Dg Tibia/fibula Right  Result Date: 07/16/2016 CLINICAL DATA:  Pain after fall  EXAM: RIGHT TIBIA AND FIBULA - 2 VIEW COMPARISON:  None. FINDINGS: Healed fractures of the proximal fibular and distal tibial diaphyses. Hardware in the proximal tibia is identified. Degenerative changes seen in the knees, particularly medially. No acute fractures. Vascular calcifications noted. No foreign bodies in the region of reported laceration. IMPRESSION: No acute abnormality. Electronically Signed   By: Gerome Samavid  Williams III M.D   On: 07/16/2016 19:19   Procedures Procedures (including critical care time)  Medications Ordered in ED Medications - No data to display   Initial Impression / Assessment and Plan / ED Course  I have reviewed the triage vital signs and the nursing notes.  Pertinent labs & imaging results that were available during my care of the patient were reviewed by me and considered in my medical decision making (see chart for details).      Anemia is chronic and unchanged from hemoglobin unchanged from prior study Final Clinical Impressions(s) / ED Diagnoses  Diagnosis #1 septic shock #2 renal insufficiency #3 hypotension #4 fall #5 skin tear to right leg Final diagnoses:  None  #6 hypokalemia #7 anemia CRITICAL CARE Performed by: Doug SouJACUBOWITZ,Verba Ainley Total critical care time:  minutes Critical care time was exclusive of separately billable procedures and treating other patients. Critical care was necessary to treat or prevent imminent or life-threatening deterioration. Critical care was time spent personally by me on the following activities: development of treatment plan with patient and/or surrogate as well as nursing, discussions with consultants, evaluation of patient's response to treatment, examination of patient, obtaining history from patient or surrogate, ordering and performing treatments and interventions, ordering and review of laboratory studies, ordering and review of radiographic studies, pulse oximetry and re-evaluation of patient's condition. New  Prescriptions New Prescriptions   No medications on file     Doug SouJacubowitz, Kavina Cantave, MD 07/16/16 2353

## 2016-07-16 NOTE — Progress Notes (Signed)
Pharmacy Antibiotic Note  [name] is a 83 y.o. female admitted on 07/16/2016 with sepsis.  Pt presented to ED following a fall with noted RLE skin tear.  Pt denied cough or fever but noted elevated WBC, LA, hypotension, and fever in ED.  Pharmacy has been consulted for Vancomycin and Zosyn dosing.  Plan: Vancomycin 750mg  IV x 1 ordered in ED. Vancomycin 500 mg IV every 24 hours.  Goal trough 15-20 mcg/mL. Zosyn 3.375g IV q8h (4 hour infusion).  Height: 5' (152.4 cm) Weight: 100 lb (45.4 kg) IBW/kg (Calculated) : 45.5  Temp (24hrs), Avg:101.6 F (38.7 C), Min:101.6 F (38.7 C), Max:101.6 F (38.7 C)   Recent Labs Lab 07/16/16 1915 07/16/16 1945 07/16/16 2001  WBC 20.8*  --   --   CREATININE  --  1.20*  --   LATICACIDVEN  --   --  2.36*    Estimated Creatinine Clearance: 20.1 mL/min (A) (by C-G formula based on SCr of 1.2 mg/dL (H)).    No Known Allergies  Antimicrobials this admission: Vanc 6/24 >> Zosyn 6/24 >>  Dose adjustments this admission:  Microbiology results: 6/24 BCx: pending 6/24 UCx: pending  Thank you for allowing pharmacy to be a part of this patient's care.  Toys 'R' UsKimberly Camyla Henson, Pharm.D., BCPS Clinical Pharmacist Pager: [phone] 07/16/2016 8:59 PM

## 2016-07-17 ENCOUNTER — Inpatient Hospital Stay (HOSPITAL_COMMUNITY): Payer: Medicare Other

## 2016-07-17 LAB — MRSA PCR SCREENING: MRSA by PCR: NEGATIVE

## 2016-07-17 LAB — CBC
HCT: 25.8 % — ABNORMAL LOW (ref 36.0–46.0)
Hemoglobin: 7.8 g/dL — ABNORMAL LOW (ref 12.0–15.0)
MCH: 23.6 pg — AB (ref 26.0–34.0)
MCHC: 30.2 g/dL (ref 30.0–36.0)
MCV: 77.9 fL — AB (ref 78.0–100.0)
PLATELETS: 400 10*3/uL (ref 150–400)
RBC: 3.31 MIL/uL — ABNORMAL LOW (ref 3.87–5.11)
RDW: 14.7 % (ref 11.5–15.5)
WBC: 17.4 10*3/uL — ABNORMAL HIGH (ref 4.0–10.5)

## 2016-07-17 LAB — BASIC METABOLIC PANEL
Anion gap: 7 (ref 5–15)
BUN: 14 mg/dL (ref 6–20)
CALCIUM: 7.1 mg/dL — AB (ref 8.9–10.3)
CHLORIDE: 106 mmol/L (ref 101–111)
CO2: 23 mmol/L (ref 22–32)
CREATININE: 0.97 mg/dL (ref 0.44–1.00)
GFR calc Af Amer: 56 mL/min — ABNORMAL LOW (ref >60)
GFR calc non Af Amer: 48 mL/min — ABNORMAL LOW (ref >60)
Glucose, Bld: 90 mg/dL (ref 65–99)
Potassium: 3.5 mmol/L (ref 3.5–5.1)
SODIUM: 136 mmol/L (ref 135–145)

## 2016-07-17 LAB — TSH: TSH: 1.703 u[IU]/mL (ref 0.350–4.500)

## 2016-07-17 MED ORDER — VANCOMYCIN HCL 500 MG IV SOLR
500.0000 mg | INTRAVENOUS | Status: DC
  Administered 2016-07-18 – 2016-07-19 (×2): 500 mg via INTRAVENOUS
  Filled 2016-07-17 (×2): qty 500

## 2016-07-17 MED ORDER — ENSURE ENLIVE PO LIQD
237.0000 mL | Freq: Two times a day (BID) | ORAL | Status: DC
  Administered 2016-07-17 – 2016-07-21 (×8): 237 mL via ORAL

## 2016-07-17 NOTE — Progress Notes (Signed)
Subjective:  Feeling better. Denies any complaints. Blood pressure improved remains afebrile since last night. Cultures are pending  Objective:  Vital Signs in the last 24 hours: Temp:  [98 F (36.7 C)-101.6 F (38.7 C)] 98 F (36.7 C) (06/25 0415) Pulse Rate:  [72-104] 80 (06/25 0415) Resp:  [16-30] 20 (06/25 0415) BP: (89-128)/(45-92) 128/63 (06/25 0415) SpO2:  [95 %-100 %] 97 % (06/25 0415) Weight:  [45.4 kg (100 lb)-49.4 kg (109 lb)] 49.4 kg (109 lb) (06/25 0415)  Intake/Output from previous day: 06/24 0701 - 06/25 0700 In: 700 [IV Piggyback:700] Out: -  Intake/Output from this shift: No intake/output data recorded.  Physical Exam: Neck: no adenopathy, no carotid bruit, no JVD and supple, symmetrical, trachea midline Lungs: Bibasilar rhonchi and faint rales noted Heart: regularly irregular rhythm, S1, S2 normal and 3/6 systolic murmur noted Abdomen: soft, non-tender; bowel sounds normal; no masses,  no organomegaly Extremities: extremities normal, atraumatic, no cyanosis or edema and Dressing right leg noted  Lab Results:  Recent Labs  07/16/16 1915  WBC 20.8*  HGB 8.5*  PLT 476*    Recent Labs  07/16/16 1945  NA 135  K 4.6  CL 102  CO2 26  GLUCOSE 109*  BUN 18  CREATININE 1.20*   No results for input(s): TROPONINI in the last 72 hours.  Invalid input(s): CK, MB Hepatic Function Panel  Recent Labs  07/16/16 1945  PROT 5.3*  ALBUMIN 2.5*  AST 40  ALT 17  ALKPHOS 71  BILITOT 0.5   No results for input(s): CHOL in the last 72 hours. No results for input(s): PROTIME in the last 72 hours.  Imaging: Imaging results have been reviewed and Dg Chest 2 View  Result Date: 07/16/2016 CLINICAL DATA:  Pain after fall. EXAM: CHEST  2 VIEW COMPARISON:  May 14, 2016 FINDINGS: No pneumothorax. The cardiomediastinal silhouette is stable. Stable pacemaker. Minimal opacity in the right mid lung is likely atelectasis. No acute infiltrate. The nodule seen on the  lateral view is calcified and was seen previously in the left upper lobe. Compression fractures of the upper lumbar spine are unchanged since December 2017. No new compression fractures identified. IMPRESSION: No suspicious acute abnormalities. Electronically Signed   By: Gerome Sam III M.D   On: 07/16/2016 19:17   Dg Tibia/fibula Right  Result Date: 07/16/2016 CLINICAL DATA:  Pain after fall EXAM: RIGHT TIBIA AND FIBULA - 2 VIEW COMPARISON:  None. FINDINGS: Healed fractures of the proximal fibular and distal tibial diaphyses. Hardware in the proximal tibia is identified. Degenerative changes seen in the knees, particularly medially. No acute fractures. Vascular calcifications noted. No foreign bodies in the region of reported laceration. IMPRESSION: No acute abnormality. Electronically Signed   By: Gerome Sam III M.D   On: 07/16/2016 19:19   Dg Hips Bilat With Pelvis 2v  Result Date: 07/17/2016 CLINICAL DATA:  Status post fall, generalized hip pain. EXAM: DG HIP (WITH OR WITHOUT PELVIS) 2V BILAT COMPARISON:  None in PACs FINDINGS: The bony pelvis is subjectively osteopenic. No pelvic fracture is observed. The patient has undergone left total hip joint prosthesis placement. Radiographic positioning of the prosthetic components appears good. The native bone appears intact. The patient has undergone previous ORIF with pinning for a subcapital fracture of the right hip. No acute right hip fracture is observed. There is moderate asymmetric narrowing of the right hip joint space. IMPRESSION: No acute fracture or dislocation is observed. There is significant degenerative change of the native right  hip. The prosthetic left hip appears normal. Electronically Signed   By: David  SwazilandJordan M.D.   On: 07/17/2016 07:42    Cardiac Studies:  Assessment/Plan:  Status post fall sustaining abrasions over right leg rule out a fracture Septic shock rule out bilateral pneumonia Severe aortic  stenosis Hypertension History of congestive heart failure secondary to depressed LV systolic function Hypothyroidism Osteoarthritis Osteopenia History of diverticular bleed in the past Anemia of chronic disease History of compression fracture of the spine in the past secondary to fall History of thalamic lacunar infarct in the past Severe protein calorie malnutrition Plan Continue present management Check labs Check cultures PT consult Social service consult for skilled nursing facility  LOS: 1 day    Rinaldo CloudHarwani, Reyne Falconi 07/17/2016, 8:16 AM

## 2016-07-17 NOTE — Progress Notes (Signed)
PT Cancellation Note  Patient Details Name: [name] [name] MRN: [id number] DOB: 05-Jul-1918   Cancelled Treatment:    Reason Eval/Treat Not Completed:  (Pt refused based on RLE feeling bad). Larey SeatFell and hit it prior to admission, confused and talked with nursing about her refusal.  Pt is ALF resident, will be assessed in the AM as she will allow.   Ivar DrapeRuth E Emmely Bittinger 07/17/2016, 1:57 PM   Samul Dadauth Pang Robers, PT MS Acute Rehab Dept. Number: [facility] [phone] and [facility] [phone]

## 2016-07-17 NOTE — Consult Note (Addendum)
WOC Nurse wound consult note Reason for Consult: Consult requested for leg abrasions. Left outer calf with partial thickness abrasion; 3X1X.1cm, pale pink wound bed, small amt yellow drainage, no odor. Right calf with full thickness abrasion; 5X7X.2cm, skin flap approximated over 20% of wound, dark purple bruising surrounding, 80% red moist wound bed. Dressing procedure/placement/frequency: Foam dressing to promote healing to left leg abrasion; xeroform gauze to promote healing to right leg wound. No family members present to discuss plan of care; patient does not appear to understand when  topical treatment was discussed. Please re-consult if further assistance is needed.  Thank-you,  Cammie Mcgeeawn Yesica Kemler MSN, RN, CWOCN, [place]rsWCN-AP, CNS [phone]

## 2016-07-18 LAB — URINE CULTURE: Culture: NO GROWTH

## 2016-07-18 NOTE — Progress Notes (Signed)
Subjective:  Patient more awake eating breakfast states feels better denies any complaints. States right leg pain better.  Objective:  Vital Signs in the last 24 hours: Temp:  [97.5 F (36.4 C)-98.1 F (36.7 C)] 98.1 F (36.7 C) (06/26 0900) Pulse Rate:  [58-109] 60 (06/26 0900) Resp:  [18-20] 20 (06/26 0900) BP: (119-154)/(57-73) 142/62 (06/26 0900) SpO2:  [98 %-100 %] 100 % (06/26 0900)  Intake/Output from previous day: 06/25 0701 - 06/26 0700 In: 845.7 [P.O.:560; I.V.:135.7; IV Piggyback:150] Out: 450 [Urine:450] Intake/Output from this shift: Total I/O In: 100 [IV Piggyback:100] Out: 200 [Urine:200]  Physical Exam: Neck: no adenopathy, no carotid bruit, no JVD and supple, symmetrical, trachea midline Lungs: Decreased breath sound at bases with occasional rhonchi Heart: regular rate and rhythm, S1, S2 normal and 3/6 systolic murmur noted Abdomen: soft, non-tender; bowel sounds normal; no masses,  no organomegaly Extremities: extremities normal, atraumatic, no cyanosis or edema and Dressing right leg noted  Lab Results:  Recent Labs  07/16/16 1915 07/17/16 0826  WBC 20.8* 17.4*  HGB 8.5* 7.8*  PLT 476* 400    Recent Labs  07/16/16 1945 07/17/16 0826  NA 135 136  K 4.6 3.5  CL 102 106  CO2 26 23  GLUCOSE 109* 90  BUN 18 14  CREATININE 1.20* 0.97   No results for input(s): TROPONINI in the last 72 hours.  Invalid input(s): CK, MB Hepatic Function Panel  Recent Labs  07/16/16 1945  PROT 5.3*  ALBUMIN 2.5*  AST 40  ALT 17  ALKPHOS 71  BILITOT 0.5   No results for input(s): CHOL in the last 72 hours. No results for input(s): PROTIME in the last 72 hours.  Imaging: Imaging results have been reviewed and Dg Chest 2 View  Result Date: 07/16/2016 CLINICAL DATA:  Pain after fall. EXAM: CHEST  2 VIEW COMPARISON:  May 14, 2016 FINDINGS: No pneumothorax. The cardiomediastinal silhouette is stable. Stable pacemaker. Minimal opacity in the right mid lung  is likely atelectasis. No acute infiltrate. The nodule seen on the lateral view is calcified and was seen previously in the left upper lobe. Compression fractures of the upper lumbar spine are unchanged since December 2017. No new compression fractures identified. IMPRESSION: No suspicious acute abnormalities. Electronically Signed   By: Gerome Sam III M.D   On: 07/16/2016 19:17   Dg Tibia/fibula Right  Result Date: 07/16/2016 CLINICAL DATA:  Pain after fall EXAM: RIGHT TIBIA AND FIBULA - 2 VIEW COMPARISON:  None. FINDINGS: Healed fractures of the proximal fibular and distal tibial diaphyses. Hardware in the proximal tibia is identified. Degenerative changes seen in the knees, particularly medially. No acute fractures. Vascular calcifications noted. No foreign bodies in the region of reported laceration. IMPRESSION: No acute abnormality. Electronically Signed   By: Gerome Sam III M.D   On: 07/16/2016 19:19   Dg Chest Port 1 View  Result Date: 07/17/2016 CLINICAL DATA:  Follow-up pneumonia. EXAM: PORTABLE CHEST 1 VIEW COMPARISON:  Chest x-rays dated 07/16/2016 and 05/14/2016. FINDINGS: Improved aeration within the right lung, consistent with resolved atelectasis. There is no confluent opacity to suggest a pneumonia. Probable small bilateral pleural effusions versus pleural thickening. Cardiomegaly is stable. Atherosclerotic calcifications noted at the aortic arch. Pacemaker/ICD leads are stable in position. No acute or suspicious osseous finding. IMPRESSION: No evidence of pneumonia on today's exam. Improved aeration within the right lung, likely resolved atelectasis. Stable cardiomegaly. Aortic atherosclerosis. Electronically Signed   By: Bary Richard M.D.   On:  07/17/2016 11:18   Dg Hips Bilat With Pelvis 2v  Result Date: 07/17/2016 CLINICAL DATA:  Status post fall, generalized hip pain. EXAM: DG HIP (WITH OR WITHOUT PELVIS) 2V BILAT COMPARISON:  None in PACs FINDINGS: The bony pelvis is  subjectively osteopenic. No pelvic fracture is observed. The patient has undergone left total hip joint prosthesis placement. Radiographic positioning of the prosthetic components appears good. The native bone appears intact. The patient has undergone previous ORIF with pinning for a subcapital fracture of the right hip. No acute right hip fracture is observed. There is moderate asymmetric narrowing of the right hip joint space. IMPRESSION: No acute fracture or dislocation is observed. There is significant degenerative change of the native right hip. The prosthetic left hip appears normal. Electronically Signed   By: David  SwazilandJordan M.D.   On: 07/17/2016 07:42    Cardiac Studies:  Assessment/Plan:  Status post fall sustaining abrasions over right leg  Sepsis syndrome questionable etiology Marked leukocytosis Severe aortic stenosis Hypertension History of congestive heart failure secondary to depressed LV systolic function Hypothyroidism Osteoarthritis antibiotics tomorrow if culture remains negative  LOS: 2 days   Osteopenia History of diverticular bleed in the past Anemia of chronic disease History of compression fracture of the spine in the past secondary to fall History of thalamic lacunar infarct in the past Severe protein calorie malnutrition Plan Check cultures Encourage ambulation as tolerated Check labs in a.m. Will switch antibiotics tomorrow once culture results available  [name], [name] 07/18/2016, 10:22 AM

## 2016-07-18 NOTE — Evaluation (Signed)
Physical Therapy Evaluation Patient Details Name: [name] MRN: [id number] DOB: January 05, 1919 Today's Date: 07/18/2016   History of Present Illness  Patient is 83 year old female with past medical history significant for multiple medical problems i.e. severe aortic stenosis, hypertension, congestive heart failure secondary to depressed LV systolic function, chronic atrial fibrillation, hypertension, hyperlipidemia, peripheral vascular disease, osteoarthritis/osteopenia, sick sinus syndrome history of tachybradycardia syndrome status post permanent pacemaker, history of thalamic lacunar infarct in the past history of diverticular bleed in the past anemia of chronic disease.  Admitted due to diarrhea and fall at home.  Clinical Impression  Patient presents with decreased independence with mobility due to weakness, pain in R LE and she will benefit from skilled PT in the acute setting prior to d/c.  She feels the staff at ALF able to assist for mobility at this time.  She will need HHPT at d/c if staff able to provide min to mod A level for mobility, otherwise may need to consider SNF rehab.     Follow Up Recommendations Home health PT;Supervision/Assistance - 24 hour    Equipment Recommendations  None recommended by PT    Recommendations for Other Services       Precautions / Restrictions Precautions Precautions: Fall      Mobility  Bed Mobility Overal bed mobility: Needs Assistance Bed Mobility: Supine to Sit     Supine to sit: Mod assist     General bed mobility comments: assist for scooting to EOB and to lift trunk, pt using rail  Transfers Overall transfer level: Needs assistance Equipment used: Rolling walker (2 wheeled) Transfers: Sit to/from Stand Sit to Stand: Mod assist         General transfer comment: lifting help from EOB  Ambulation/Gait Ambulation/Gait assistance: Mod assist Ambulation Distance (Feet): 20 Feet Assistive device: Rolling walker (2  wheeled) Gait Pattern/deviations: Step-to pattern;Decreased stride length;Antalgic;Shuffle;Decreased dorsiflexion - right;Decreased step length - right;Trunk flexed     General Gait Details: cues for technique due to R LE pain and weakness with foot drop on R; around bed to recliner support for each step for balance, safety and due to pain  Stairs            Wheelchair Mobility    Modified Rankin (Stroke Patients Only)       Balance Overall balance assessment: Needs assistance   Sitting balance-Leahy Scale: Fair     Standing balance support: Bilateral upper extremity supported Standing balance-Leahy Scale: Poor Standing balance comment: UE support and assist for balance                             Pertinent Vitals/Pain Pain Assessment: Faces Faces Pain Scale: Hurts little more Pain Location: R LE with ambulation Pain Descriptors / Indicators: Discomfort;Sore Pain Intervention(s): Monitored during session;Repositioned    Home Living Family/patient expects to be discharged to:: Assisted living Living Arrangements: Alone             Home Equipment: Dan Humphreys - 2 wheels;Shower seat - built in;Cane - single point Additional Comments: Lives at [facility]    Prior Function Level of Independence: Needs assistance   Gait / Transfers Assistance Needed: walks independent with walker usually  ADL's / Homemaking Assistance Needed: staff assists with shower, pt can dress herself usually        Hand Dominance   Dominant Hand: Right    Extremity/Trunk Assessment   Upper Extremity Assessment Upper Extremity Assessment: Generalized weakness  Lower Extremity Assessment Lower Extremity Assessment: RLE deficits/detail;LLE deficits/detail RLE Deficits / Details: kerlix covering area injured in fall per pt skin peeled back; moves antigravity, but slower and with pain, unable to perform ankle DF, but PROM grossly to neutral, limited by pain LLE Deficits  / Details: AROM WFL, strength grossly 4- to 4/5    Cervical / Trunk Assessment Cervical / Trunk Assessment: Kyphotic  Communication   Communication: No difficulties  Cognition Arousal/Alertness: Awake/alert Behavior During Therapy: WFL for tasks assessed/performed Overall Cognitive Status: Within Functional Limits for tasks assessed                                        General Comments      Exercises     Assessment/Plan    PT Assessment Patient needs continued PT services  PT Problem List Decreased strength;Decreased activity tolerance;Decreased balance;Decreased mobility;Decreased knowledge of use of DME       PT Treatment Interventions DME instruction;Gait training;Functional mobility training;Balance training;Therapeutic exercise;Therapeutic activities;Patient/family education    PT Goals (Current goals can be found in the Care Plan section)  Acute Rehab PT Goals Patient Stated Goal: To go back to ALF PT Goal Formulation: With patient Time For Goal Achievement: 07/25/16 Potential to Achieve Goals: Fair    Frequency Min 3X/week   Barriers to discharge        Co-evaluation               AM-PAC PT "6 Clicks" Daily Activity  Outcome Measure Difficulty turning over in bed (including adjusting bedclothes, sheets and blankets)?: Total Difficulty moving from lying on back to sitting on the side of the bed? : Total Difficulty sitting down on and standing up from a chair with arms (e.g., wheelchair, bedside commode, etc,.)?: Total Help needed moving to and from a bed to chair (including a wheelchair)?: A Lot Help needed walking in hospital room?: A Lot Help needed climbing 3-5 steps with a railing? : Total 6 Click Score: 8    End of Session Equipment Utilized During Treatment: Gait belt Activity Tolerance: Patient tolerated treatment well Patient left: in chair;with call bell/phone within reach;Other (comment) (chair alarm in chair, but no box  for monitoring, RN aware) Nurse Communication: Mobility status PT Visit Diagnosis: Muscle weakness (generalized) (M62.81);Difficulty in walking, not elsewhere classified (R26.2)    Time: 1040-1105 PT Time Calculation (min) (ACUTE ONLY): 25 min   Charges:   PT Evaluation $PT Eval Moderate Complexity: 1 Procedure PT Treatments $Gait Training: 8-22 mins   PT G CodesSheran Lawless:        Cyndi Wynn, [place]PT [phone] 07/18/2016   Elray Mcgregorynthia Wynn 07/18/2016, 12:16 PM

## 2016-07-19 LAB — CBC
HEMATOCRIT: 31.1 % — AB (ref 36.0–46.0)
HEMOGLOBIN: 9.1 g/dL — AB (ref 12.0–15.0)
MCH: 23.5 pg — ABNORMAL LOW (ref 26.0–34.0)
MCHC: 29.3 g/dL — AB (ref 30.0–36.0)
MCV: 80.2 fL (ref 78.0–100.0)
Platelets: 514 10*3/uL — ABNORMAL HIGH (ref 150–400)
RBC: 3.88 MIL/uL (ref 3.87–5.11)
RDW: 15.1 % (ref 11.5–15.5)
WBC: 16.2 10*3/uL — AB (ref 4.0–10.5)

## 2016-07-19 LAB — BASIC METABOLIC PANEL
ANION GAP: 9 (ref 5–15)
BUN: 15 mg/dL (ref 6–20)
CALCIUM: 8.1 mg/dL — AB (ref 8.9–10.3)
CO2: 26 mmol/L (ref 22–32)
CREATININE: 1.02 mg/dL — AB (ref 0.44–1.00)
Chloride: 102 mmol/L (ref 101–111)
GFR calc non Af Amer: 45 mL/min — ABNORMAL LOW (ref >60)
GFR, EST AFRICAN AMERICAN: 52 mL/min — AB (ref >60)
Glucose, Bld: 109 mg/dL — ABNORMAL HIGH (ref 65–99)
Potassium: 3.8 mmol/L (ref 3.5–5.1)
SODIUM: 137 mmol/L (ref 135–145)

## 2016-07-19 LAB — OCCULT BLOOD X 1 CARD TO LAB, STOOL: Fecal Occult Bld: POSITIVE — AB

## 2016-07-19 MED ORDER — LEVOFLOXACIN 500 MG PO TABS
500.0000 mg | ORAL_TABLET | ORAL | Status: DC
  Administered 2016-07-19 – 2016-07-21 (×2): 500 mg via ORAL
  Filled 2016-07-19 (×2): qty 1

## 2016-07-19 NOTE — Progress Notes (Signed)
Subjective:  Denies any chest pain or shortness of breath.  Objective:  Vital Signs in the last 24 hours: Temp:  [97.5 F (36.4 C)-98 F (36.7 C)] 98 F (36.7 C) (06/27 1000) Pulse Rate:  [60-71] 62 (06/27 1000) Resp:  [18] 18 (06/27 1000) BP: (96-135)/(53-78) 112/71 (06/27 1000) SpO2:  [96 %-100 %] 98 % (06/27 1000)  Intake/Output from previous day: 06/26 0701 - 06/27 0700 In: 1000 [P.O.:600; IV Piggyback:400] Out: 350 [Urine:350] Intake/Output from this shift: Total I/O In: 120 [P.O.:120] Out: -   Physical Exam: Neck: no adenopathy, no carotid bruit, no JVD and supple, symmetrical, trachea midline Lungs: clear to auscultation bilaterally Heart: regular rate and rhythm, S1, S2 normal and 3/6 systolic murmur noted Abdomen: soft, non-tender; bowel sounds normal; no masses,  no organomegaly Extremities: extremities normal, atraumatic, no cyanosis or edema and dressing right leg  Lab Results:  Recent Labs  07/17/16 0826 07/19/16 1008  WBC 17.4* 16.2*  HGB 7.8* 9.1*  PLT 400 514*    Recent Labs  07/17/16 0826 07/19/16 1008  NA 136 137  K 3.5 3.8  CL 106 102  CO2 23 26  GLUCOSE 90 109*  BUN 14 15  CREATININE 0.97 1.02*   No results for input(s): TROPONINI in the last 72 hours.  Invalid input(s): CK, MB Hepatic Function Panel  Recent Labs  07/16/16 1945  PROT 5.3*  ALBUMIN 2.5*  AST 40  ALT 17  ALKPHOS 71  BILITOT 0.5   No results for input(s): CHOL in the last 72 hours. No results for input(s): PROTIME in the last 72 hours.  Imaging: Imaging results have been reviewed and No results found.  Cardiac Studies:  Assessment/Plan:  Status post fall sustaining abrasions over right leg  Sepsis syndrome questionable etiology Marked leukocytosis Severe aortic stenosis Hypertension History of congestive heart failure secondary to depressed LV systolic function Hypothyroidism osteoarthritis Osteopenia History of diverticular bleed in the  past Anemia of chronic disease History of compression fracture of the spine in the past secondary to fall History of thalamic lacunar infarct in the past Severe protein calorie malnutrition Plan DC vancomycin and Zosyn. Start Levaquin 500 mg by mouth daily Social services for discharge planning to possibly skilled nursing facility  LOS: 3 days    Rinaldo CloudHarwani, Odin Mariani 07/19/2016, 11:41 AM

## 2016-07-19 NOTE — Progress Notes (Signed)
Physical Therapy Treatment Patient Details Name: [name] MRN: [id number] DOB: 1919/01/23 Today's Date: 07/19/2016    History of Present Illness Patient is 83 year old female with past medical history significant for multiple medical problems i.e. severe aortic stenosis, hypertension, congestive heart failure secondary to depressed LV systolic function, chronic atrial fibrillation, hypertension, hyperlipidemia, peripheral vascular disease, osteoarthritis/osteopenia, sick sinus syndrome history of tachybradycardia syndrome status post permanent pacemaker, history of thalamic lacunar infarct in the past history of diverticular bleed in the past anemia of chronic disease.  Admitted due to diarrhea and fall at home.    PT Comments    Pt is getting up to walk with PT and set aside her peri device for maintaining her dryness.  Pt is somewhat unsure of details of history but is willing to work with PT toward her previous level of use on RW.  Pt is going to need hands on help from ALF if she leaves today.  Will progress her therapy as tolerated and focus on standing endurance, safety awareness and strength/flexibility of LE's to increase safe maneuvering given her confined living arrangement.   Follow Up Recommendations  Home health PT;Supervision/Assistance - 24 hour     Equipment Recommendations  None recommended by PT    Recommendations for Other Services       Precautions / Restrictions Precautions Precautions: Fall Restrictions Weight Bearing Restrictions: No    Mobility  Bed Mobility               General bed mobility comments: OOB to chair when PT arrived  Transfers Overall transfer level: Needs assistance Equipment used: Rolling walker (2 wheeled) Transfers: Sit to/from Stand Sit to Stand: Min assist         General transfer comment: cues for hand placement 100% of the time  Ambulation/Gait Ambulation/Gait assistance: Min assist Ambulation Distance  (Feet): 28 Feet Assistive device: Rolling walker (2 wheeled) Gait Pattern/deviations: Step-through pattern;Step-to pattern;Shuffle;Decreased stride length;Wide base of support;Trunk flexed Gait velocity: reduced Gait velocity interpretation: Below normal speed for age/gender General Gait Details: Pt is demonstrating some dependence on walker to help control weakness R ankle   Stairs            Wheelchair Mobility    Modified Rankin (Stroke Patients Only)       Balance Overall balance assessment: Needs assistance Sitting-balance support: Feet supported Sitting balance-Leahy Scale: Fair     Standing balance support: Bilateral upper extremity supported Standing balance-Leahy Scale: Poor                              Cognition Arousal/Alertness: Awake/alert Behavior During Therapy: WFL for tasks assessed/performed Overall Cognitive Status: Within Functional Limits for tasks assessed                                        Exercises General Exercises - Lower Extremity Ankle Circles/Pumps: AROM;AAROM;Both;10 reps Short Arc Quad: AROM;Both;10 reps    General Comments        Pertinent Vitals/Pain Pain Assessment: Faces Faces Pain Scale: Hurts little more Pain Location: R LE with ambulation Pain Descriptors / Indicators: Discomfort;Sore Pain Intervention(s): Monitored during session;Premedicated before session;Repositioned    Home Living                      Prior Function  PT Goals (current goals can now be found in the care plan section) Acute Rehab PT Goals Patient Stated Goal: To go back to ALF Progress towards PT goals: Progressing toward goals    Frequency    Min 3X/week      PT Plan Current plan remains appropriate    Co-evaluation              AM-PAC PT "6 Clicks" Daily Activity  Outcome Measure  Difficulty turning over in bed (including adjusting bedclothes, sheets and blankets)?:  Total Difficulty moving from lying on back to sitting on the side of the bed? : Total Difficulty sitting down on and standing up from a chair with arms (e.g., wheelchair, bedside commode, etc,.)?: Total Help needed moving to and from a bed to chair (including a wheelchair)?: A Little Help needed walking in hospital room?: A Little Help needed climbing 3-5 steps with a railing? : A Lot 6 Click Score: 11    End of Session Equipment Utilized During Treatment: Gait belt Activity Tolerance: Patient tolerated treatment well Patient left: in chair;with call bell/phone within reach;with chair alarm set Nurse Communication: Mobility status PT Visit Diagnosis: Muscle weakness (generalized) (M62.81);Difficulty in walking, not elsewhere classified (R26.2)     Time: [id number] PT Time Calculation (min) (ACUTE ONLY): 28 min  Charges:  $Gait Training: 8-22 mins $Therapeutic Exercise: 8-22 mins                    G Codes:  Functional Assessment Tool Used: AM-PAC 6 Clicks Basic Mobility     Ivar DrapeRuth E Fia Hebert 07/19/2016, 1:41 PM   Samul Dadauth Elimelech Houseman, PT MS Acute Rehab Dept. Number: [facility] [phone] and [facility] [phone]

## 2016-07-19 NOTE — Care Management Important Message (Signed)
Important Message  Patient Details  Name: Tyson AliasCarmelita J Rogalski MRN: [id number] Date of Birth: 1918-05-02   Medicare Important Message Given:  Yes    Lawerance Sabalebbie Delorese Sellin, RN 07/19/2016, 11:09 AM

## 2016-07-19 NOTE — Care Management Note (Addendum)
Case Management Note  Patient Details  Name: [name] MRN: [id number] Date of Birth: 1918-02-01  Subjective/Objective:                 [facility] Verified with facility that she is in Independent Living level of care. [facility] utilizes in house PT program [facility]. [facility] phone- [phone], FAX [phone]. Patient will need HH PT orders faxed to [facility] at [place].    Action/Plan:  CM will continue to follow.  6/29 Will DC to SNF as facilitated by CSW   Expected Discharge Date:                  Expected Discharge Plan:  Home/Self Care ([facility]  In-House Referral:     Discharge planning Services  CM Consult  Post Acute Care Choice:  Home Health Choice offered to:     DME Arranged:    DME Agency:     HH Arranged:    HH Agency:   ([facility])  Status of Service:  In process, will continue to follow  If discussed at Long Length of Stay Meetings, dates discussed:    Additional Comments:  Lawerance SabalDebbie Jetaime Pinnix, RN 07/19/2016, 11:27 AM

## 2016-07-20 NOTE — Clinical Social Work Placement (Signed)
   CLINICAL SOCIAL WORK PLACEMENT  NOTE  Date:  07/20/2016  Patient Details  Name: [name] AliasCarmelita J Fifita MRN: [id number] Date of Birth: 11-Feb-1918  Clinical Social Work is seeking post-discharge placement for this patient at the Skilled  Nursing Facility level of care (*CSW will initial, date and re-position this form in  chart as items are completed):  Yes   Patient/family provided with [place] Clinical Social Work Department's list of facilities offering this level of care within the geographic area requested by the patient (or if unable, by the patient's family).  Yes   Patient/family informed of their freedom to choose among providers that offer the needed level of care, that participate in Medicare, Medicaid or managed care program needed by the patient, have an available bed and are willing to accept the patient.  Yes   Patient/family informed of [facility]'s ownership interest in [facility] and [facility], as well as of the fact that they are under no obligation to receive care at these facilities.  PASRR submitted to EDS on       PASRR number received on       Existing PASRR number confirmed on 07/20/16     FL2 transmitted to all facilities in geographic area requested by pt/family on       FL2 transmitted to all facilities within larger geographic area on       Patient informed that his/her managed care company has contracts with or will negotiate with certain facilities, including the following:        Yes   Patient/family informed of bed offers received.  [facility]     Physician recommends and patient chooses bed at      [facility].  Patient to be transferred to facility by       Patient family notified on   of transfer.  Name of family member notified:        PHYSICIAN       Additional Comment:  07/20/16 - 6:19 pm - Clydie BraunKaren, admissions director at [facility] contacted by  phone and informed of [facility] and Friday's discharge.    _______________________________________________ Cristobal Goldmannrawford, Jermario Kalmar Bradley, LCSW 07/20/2016, 6:36 PM

## 2016-07-20 NOTE — Clinical Social Work Note (Signed)
Clinical Social Work Assessment  Patient Details  Name: [name] An MRN: [id number] Date of Birth: 08-22-1918  Date of referral:  07/19/16               Reason for consult:  Facility Placement                Permission sought to share information with:  Family Supports Permission granted to share information::  Yes, Verbal Permission Granted  Name::     [name]  Agency::     Relationship::  Son and POA  Contact Information:  [phone]  Housing/Transportation Living arrangements for the past 2 months:  Assisted Living Facility ([name]) Source of Information:  Adult Children, Patient Patient Interpreter Needed:  None Criminal Activity/Legal Involvement Pertinent to Current Situation/Hospitalization:  No - Comment as needed Significant Relationships:  Adult Children Lives with:  Facility Resident ([facility]) Do you feel safe going back to the place where you live?  Yes (Patient feels she can return to [facility], but is agreeable to ST rehab ) Need for family participation in patient care:  Yes (Comment)  Care giving concerns:  Son concerned that his mother would not get the level of rehab needed at [name] and in addition the extra expense for rehab at the assisted living facility. Sons reports that his [facility] August of 2017.   Social Worker assessment / plan:  CSW talked with son on the 27th and 28th regarding discharge disposition for patient. [name] is in agreement with ST rehab at a skilled facility and reported that she has been to rehab before, but he does not want her to return to that facility. CSW talked with son regarding the facility search process and at his request emailed a SNF list to him.  [name] reported that he is the eldest of 5 and his other siblings live remotely.  CSW talked with patient at the bedside on 6/28 and she is alert, oriented  and oriented, and very pleasant. Conversation had regarding discharge  disposition and son's thinking that she will benefit from ST rehab. [name] calmly informed CSW of her desire to return to the [facility] for rehab as she has friends at the facility and activities that she enjoys. After talking with son and understanding that rehab is short-term, and that her insurance will pay for the rehab, [name] agreed to go.   Employment status:  Retired Health and safety inspectornsurance information:  Armed forces operational officerMedicare, Games developerManaged Care (BCBS Supplement) PT Recommendations:  Skilled Nursing Facility (Home health PT;Supervision/Assistance - 24 hour. Patient does not have 24 hour supervision at [facility] and per son, it will be better and less expensive for patient to d/c to SNF for rehab, which her insurance will pay for.) Information / Referral to community resources:  Skilled Nursing Facility (Son provided with SNF list.)  Patient/Family's Response to care:  No concerns expressed by patient or son regarding care during hospitalization.  Patient/Family's Understanding of and Emotional Response to Diagnosis, Current Treatment, and Prognosis:  Not discussed.  Emotional Assessment Appearance:  Appears younger than stated age Attitude/Demeanor/Rapport:  Other (Appropriate) Affect (typically observed):  Pleasant, Appropriate Orientation:  Oriented to Self, Oriented to Place, Oriented to Situation Alcohol / Substance use:  Never Used Psych involvement (Current and /or in the community):  No (Comment)  Discharge Needs  Concerns to be addressed:  Discharge Planning Concerns Readmission within the last 30 days:  No Current discharge risk:  None Barriers to Discharge:  No  Barriers Identified   Crystal Goldmann, LCSW 07/20/2016, 6:21 PM

## 2016-07-20 NOTE — NC FL2 (Signed)
[place] MEDICAID FL2 LEVEL OF CARE SCREENING TOOL     IDENTIFICATION  Patient Name: [name] Birthdate: 1918-05-03 Sex: female Admission Date (Current Location): 07/16/2016  [facility] and [place] Number:  Producer, television/film/video and Address:  [facility], [address] [place], [place] [address]      Provider Number: [id number]  Attending Physician Name and Address:  Rinaldo Cloud, MD  Relative Name and Phone Number:  Lamar Blinks - son:  [phone]    Current Level of Care: SNF Recommended Level of Care: Skilled Nursing Facility Prior Approval Number:    Date Approved/Denied:   PASRR Number: [id number] (Eff. 08/11/11)  Discharge Plan: SNF    Current Diagnoses: Patient Active Problem List   Diagnosis Date Noted  . Sepsis (HCC) 07/16/2016  . Closed nondisplaced fracture of body of right scapula 01/07/2016  . Anemia 08/26/2015  . Atherosclerosis of native arteries of the extremities with ulceration (HCC) 04/09/2015  . Acquired autoimmune hypothyroidism 12/28/2014  . Acute lower GI bleeding 06/28/2013  . Pulmonary hypertension (HCC) 04/23/2013  . Diastolic CHF, acute on chronic (HCC) 04/23/2013  . Unspecified hypothyroidism 04/23/2013  . Tachypnea 08/15/2011  . Hypotension 08/12/2011  . Severe aortic stenosis 08/12/2011  . Hip fracture (HCC) 08/10/2011  . UTI (lower urinary tract infection) 08/10/2011  . Abdominal distention 08/10/2011  . Atrial fibrillation (HCC) 08/10/2011  . Murmur, cardiac 08/10/2011  . Atrial fibrillation with rapid ventricular response (HCC) 08/10/2011  . Chronic anticoagulation 08/10/2011    Orientation RESPIRATION BLADDER Height & Weight     Self, Place, Situation  Normal Continent (External catheter) Weight: 106 lb (48.1 kg) Height:  5' (152.4 cm)  BEHAVIORAL SYMPTOMS/MOOD NEUROLOGICAL BOWEL NUTRITION STATUS      Continent Diet (Heart healthy)  AMBULATORY STATUS COMMUNICATION OF NEEDS Skin    Extensive Assist (Mod assist per PT) Verbally Other (Comment) (Venous stasis ulcer right/left posterior/anterior leg with foam dressing)                       Personal Care Assistance Level of Assistance  Bathing, Feeding, Dressing Bathing Assistance: Limited assistance Feeding assistance: Independent Dressing Assistance: Limited assistance     Functional Limitations Info  Sight, Hearing, Speech Sight Info: Adequate Hearing Info: Adequate Speech Info: Adequate    SPECIAL CARE FACTORS FREQUENCY  PT (By licensed PT)     PT Frequency: Evaluatedn 6/26 and a minimum of 3X per week therapy recommended              Contractures Contractures Info: Not present    Additional Factors Info  Code Status, Allergies Code Status Info: DNR Allergies Info: No known allergies           Current Medications (07/20/2016):  This is the current hospital active medication list Current Facility-Administered Medications  Medication Dose Route Frequency Provider Last Rate Last Dose  . 0.9 %  sodium chloride infusion   Intravenous Continuous Rinaldo Cloud, MD 10 mL/hr at 07/17/16 0215    . acetaminophen (TYLENOL) tablet 650 mg  650 mg Oral Q8H Rinaldo Cloud, MD   650 mg at 07/20/16 8469  . digoxin (LANOXIN) tablet 0.0625 mg  0.0625 mg Oral Daily Rinaldo Cloud, MD   0.0625 mg at 07/20/16 0925  . enoxaparin (LOVENOX) injection 30 mg  30 mg Subcutaneous Q24H Rinaldo Cloud, MD   30 mg at 07/20/16 0925  . feeding supplement (ENSURE ENLIVE) (ENSURE ENLIVE) liquid 237 mL  237 mL  Oral BID BM Rinaldo CloudHarwani, Mohan, MD   237 mL at 07/20/16 0925  . ferrous sulfate 300 (60 Fe) MG/5ML syrup 300 mg  300 mg Oral BID WC Rinaldo CloudHarwani, Mohan, MD   300 mg at 07/20/16 0759  . levofloxacin (LEVAQUIN) tablet 500 mg  500 mg Oral Q48H Rinaldo CloudHarwani, Mohan, MD   500 mg at 07/19/16 1343  . levothyroxine (SYNTHROID, LEVOTHROID) tablet 100 mcg  100 mcg Oral QAC breakfast Rinaldo CloudHarwani, Mohan, MD   100 mcg at 07/20/16 0759  . traZODone  (DESYREL) tablet 50 mg  50 mg Oral QHS Rinaldo CloudHarwani, Mohan, MD   50 mg at 07/19/16 2248     Discharge Medications: Please see discharge summary for a list of discharge medications.  Relevant Imaging Results:  Relevant Lab Results:   Additional Information ss#[id number].  Cristobal Goldmannrawford, Sakari Raisanen Bradley, LCSW

## 2016-07-20 NOTE — Progress Notes (Signed)
Subjective:  Denies any complaints. Patient remains afebrile hemoglobin is stable  Objective:  Vital Signs in the last 24 hours: Temp:  [97.4 F (36.3 C)-98.3 F (36.8 C)] 97.4 F (36.3 C) (06/28 0900) Pulse Rate:  [61-95] 66 (06/28 0900) Resp:  [18-20] 18 (06/28 0900) BP: (104-142)/(51-74) 139/51 (06/28 0900) SpO2:  [94 %-100 %] 100 % (06/28 0900) Weight:  [48.1 kg (106 lb)] 48.1 kg (106 lb) (06/27 2100)  Intake/Output from previous day: 06/27 0701 - 06/28 0700 In: 240 [P.O.:240] Out: -  Intake/Output from this shift: Total I/O In: 120 [P.O.:120] Out: 300 [Urine:300]  Physical Exam: Exam unchanged  Lab Results:  Recent Labs  07/19/16 1008  WBC 16.2*  HGB 9.1*  PLT 514*    Recent Labs  07/19/16 1008  NA 137  K 3.8  CL 102  CO2 26  GLUCOSE 109*  BUN 15  CREATININE 1.02*   No results for input(s): TROPONINI in the last 72 hours.  Invalid input(s): CK, MB Hepatic Function Panel No results for input(s): PROT, ALBUMIN, AST, ALT, ALKPHOS, BILITOT, BILIDIR, IBILI in the last 72 hours. No results for input(s): CHOL in the last 72 hours. No results for input(s): PROTIME in the last 72 hours.  Imaging: Imaging results have been reviewed and No results found.  Cardiac Studies:  Assessment/Plan:  Status post fall sustaining abrasions over right leg  Status post Sepsis syndrome questionable etiology Marked leukocytosis improved Severe aortic stenosis Hypertension History of congestive heart failure secondary to depressed LV systolic function Hypothyroidism osteoarthritis Osteopenia History of diverticular bleed in the past Anemia of chronic disease History of compression fracture of the spine in the past secondary to fall History of thalamic lacunar infarct in the past Severe protein calorie malnutrition Plan Continue present management Awaiting skilled nursing facility  LOS: 4 days    Rinaldo CloudHarwani, Jisela Merlino 07/20/2016, 12:40 PM

## 2016-07-21 LAB — CULTURE, BLOOD (ROUTINE X 2)
CULTURE: NO GROWTH
Culture: NO GROWTH
SPECIAL REQUESTS: ADEQUATE
Special Requests: ADEQUATE

## 2016-07-21 MED ORDER — ENSURE ENLIVE PO LIQD: 237.0000 mL | Freq: Two times a day (BID) | ORAL | 12 refills | Status: AC

## 2016-07-21 MED ORDER — LEVOFLOXACIN 500 MG PO TABS: 500.0000 mg | ORAL_TABLET | ORAL | 0 refills | Status: AC

## 2016-07-21 MED ORDER — ACETAMINOPHEN 325 MG PO TABS
650.0000 mg | ORAL_TABLET | Freq: Once | ORAL | Status: AC
  Administered 2016-07-21: 650 mg via ORAL
  Filled 2016-07-21: qty 2

## 2016-07-21 NOTE — Progress Notes (Signed)
Patient Discharge: Disposition: [facility]. Given report to the nurse [place] and answered all her questions.  Gave copy of the AVS to the patient's son and gave another envelop with all the paperwork to be given to the nurse at the facility, verbalized understanding. IV: Discontinued IV before discharge. Telemetry: Discontinued Tele before discharge.  CCMD notified. Transportation: Patient escorted out of the unit in w/c accompanied by the son who is going [facility]. Belongings: Patient took all her belongings with her.

## 2016-07-21 NOTE — Progress Notes (Signed)
Physical Therapy Treatment Patient Details Name: [name] MRN: [id number] DOB: September 30, 1918 Today's Date: 07/21/2016    History of Present Illness Patient is 83 year old female with past medical history significant for multiple medical problems i.e. severe aortic stenosis, hypertension, congestive heart failure secondary to depressed LV systolic function, chronic atrial fibrillation, hypertension, hyperlipidemia, peripheral vascular disease, osteoarthritis/osteopenia, sick sinus syndrome history of tachybradycardia syndrome status post permanent pacemaker, history of thalamic lacunar infarct in the past history of diverticular bleed in the past anemia of chronic disease.  Admitted due to diarrhea and fall at home.    PT Comments    Pt presents with good tolerance for gait and mobility this session. Pt continues to require assistance for bed mobs and transfers which will hopefully be given to her when returning to ALF. Pt will benefit from continued PT at discharge in order to improve her gait distance and tolerance.     Follow Up Recommendations  Home health PT;Supervision/Assistance - 24 hour     Equipment Recommendations  None recommended by PT    Recommendations for Other Services       Precautions / Restrictions Precautions Precautions: Fall Restrictions Weight Bearing Restrictions: No    Mobility  Bed Mobility Overal bed mobility: Needs Assistance Bed Mobility: Supine to Sit     Supine to sit: Mod assist     General bed mobility comments: Mod A to move LE's and to scoot hips to EOB  Transfers Overall transfer level: Needs assistance Equipment used: Rolling walker (2 wheeled) Transfers: Sit to/from Stand Sit to Stand: Min assist         General transfer comment: cues for hand placement 100% of the time  Ambulation/Gait Ambulation/Gait assistance: Min assist Ambulation Distance (Feet): 15 Feet Assistive device: Rolling walker (2 wheeled) Gait  Pattern/deviations: Step-through pattern;Step-to pattern;Shuffle;Decreased stride length;Wide base of support;Trunk flexed Gait velocity: decreased Gait velocity interpretation: Below normal speed for age/gender General Gait Details: slow, steady gait. Cues for staying within walker throughout gait.    Stairs            Wheelchair Mobility    Modified Rankin (Stroke Patients Only)       Balance Overall balance assessment: Needs assistance Sitting-balance support: Feet supported Sitting balance-Leahy Scale: Fair     Standing balance support: Bilateral upper extremity supported Standing balance-Leahy Scale: Poor Standing balance comment: UE support and assist for balance                            Cognition Arousal/Alertness: Awake/alert Behavior During Therapy: WFL for tasks assessed/performed Overall Cognitive Status: Within Functional Limits for tasks assessed                                        Exercises      General Comments        Pertinent Vitals/Pain Pain Assessment: No/denies pain    Home Living                      Prior Function            PT Goals (current goals can now be found in the care plan section) Acute Rehab PT Goals Patient Stated Goal: To go back to ALF Progress towards PT goals: Progressing toward goals    Frequency    Min 3X/week  PT Plan Current plan remains appropriate    Co-evaluation              AM-PAC PT "6 Clicks" Daily Activity  Outcome Measure  Difficulty turning over in bed (including adjusting bedclothes, sheets and blankets)?: Total Difficulty moving from lying on back to sitting on the side of the bed? : Total Difficulty sitting down on and standing up from a chair with arms (e.g., wheelchair, bedside commode, etc,.)?: Total Help needed moving to and from a bed to chair (including a wheelchair)?: A Little Help needed walking in hospital room?: A  Little Help needed climbing 3-5 steps with a railing? : A Lot 6 Click Score: 11    End of Session Equipment Utilized During Treatment: Gait belt Activity Tolerance: Patient tolerated treatment well Patient left: in chair;with call bell/phone within reach;with chair alarm set Nurse Communication: Mobility status PT Visit Diagnosis: Muscle weakness (generalized) (M62.81);Difficulty in walking, not elsewhere classified (R26.2)     Time: [id number] PT Time Calculation (min) (ACUTE ONLY): 26 min  Charges:  $Gait Training: 8-22 mins $Therapeutic Activity: 8-22 mins                    G Codes:       Colin BroachSabra M. Adalee Kathan PT, DPT  [phone]    Ruel FavorsSabra Aletha HalimMarie Salome Hautala 07/21/2016, 1:21 PM

## 2016-07-21 NOTE — Discharge Summary (Signed)
[place]  discharge summary dictated on 07/21/2016, dictation number is [phone]

## 2016-07-21 NOTE — Discharge Summary (Signed)
[name], [name]            ACCOUNT NO.:  [id number]  MEDICAL RECORD NO.:  [id number]  LOCATION:  E47C                         FACILITY:  [facility]  PHYSICIAN:  Eduardo Osier. Sharyn Lull, M.D. DATE OF BIRTH:  1918-08-13  DATE OF ADMISSION:  07/16/2016 DATE OF DISCHARGE:  07/21/2016                              DISCHARGE SUMMARY   ADMITTING DIAGNOSES: 1. Status post fall sustaining abrasions over the right leg to rule     out fracture. 2. Septic shock rule out possible bilateral pneumonia. 3. Severe aortic stenosis. 4. Hypertension. 5. History of congestive heart failure secondary to depressed left     ventricular systolic function. 6. Hypothyroidism. 7. Osteoarthritis. 8. Osteopenia. 9. History of diverticular bleed in the past. 10.Anemia of chronic disease. 11.History of compression fracture of the spine in the past secondary     to fall. 12.History of thalamic lacunar infarct in the past. 13.Severe protein-calorie malnutrition.  DISCHARGE DIAGNOSES: 1. Status post fall sustaining abrasions and ulcers over the right     leg. 2. Status post septic shock probably due to skin infections. 3. Severe aortic stenosis. 4. Hypertension. 5. History of congestive heart failure secondary to depressed left     ventricular systolic function. 6. Hypothyroidism. 7. Osteoarthritis. 8. Osteopenia. 9. History of diverticular bleed in the past. 10.Anemia of chronic disease. 11.History of compression fracture of the spine in the past secondary     to fall. 12.History of thalamic lacunar infarct in the past. 13.Severe protein-calorie malnutrition.  DISCHARGE HOME MEDICATIONS: 1. Ensure 1 can twice daily. 2. Levaquin 500 mg every 48 hours or 3 more tablets. 3. Amiodarone 200 mg daily. 4. Calcium 1 tablet daily. 5. Prolia 60 mg every 6 months. 6. Digoxin 0.125 mg 1/2 tablet daily. 7. Ferrous sulfate 220 mg by mouth twice daily. 8. Fish oil 1 capsule daily. 9. Levothyroxine 100 mcg  daily. 10.Imodium 2 mg every 4 hours as needed for diarrhea. 11.Metoprolol succinate 25 mg daily. 12.Potassium chloride 10 mEq daily. 13.Trazodone 50 mg daily at night. 14.Tylenol 650 mg every 8 hours as needed. 15.Vitamin B12 daily. 16.Lasix 40 mg alternating with 80 mg every other day as before.  DIET:  As tolerated.  CONDITION AT DISCHARGE:  Stable.  FOLLOWUP:  Follow up with me in 2 weeks.  BRIEF HISTORY AND HOSPITAL COURSE:  [name] is 83 year old female with past medical history significant for multiple medical problems, i.e., severe aortic stenosis, hypertension, congestive heart failure secondary to depressed LV systolic function, chronic atrial fibrillation, hypertension, hyperlipidemia, peripheral vascular disease, osteoarthritis/osteopenia, sick sinus syndrome, history of tachy-brady syndrome, status post permanent pacemaker, history of thalamic lacunar infarct in the past, history of diverticular bleed in the past, anemia of chronic disease, and peripheral vascular disease, she came to the ER following a fall sustaining abrasions over the right leg as per son while walking to the bathroom, using the walker, had a fall, sustaining injury to the right leg.  The patient has difficulty ambulating so decided to come to the ED.  The patient denies any chest pain or shortness of breath.  Denies any palpitation, lightheadedness, or syncopal episode prior to the fall.  The patient also complains of diarrhea off and  on for the last few days.  Denies any fever or chills. Denies any recent antibiotic use.  The patient was noted in the ED to have high-grade fever associated with marked leukocytosis and was noted to be hypotensive.  The patient received 1 L of fluid bolus in the ED and was started on broad-spectrum antibiotics after obtaining the pancultures.  PHYSICAL EXAMINATION:  GENERAL:  On examination, she was awake. VITAL SIGNS:  Blood pressure was 89/51 pulse 81,  temperature was 10.6 degree Fahrenheit. EYES:  Conjunctivae were pink. NECK:  Supple.  No JVD. CARDIOVASCULAR:  Regular rate and rhythm.  There was a 3/6 systolic murmur. LUNGS:  She had bilateral rhonchi with faint rales after fluid challenge. ABDOMEN:  Soft.  Bowel sounds are present.  Nontender. EXTREMITIES:  There was no clubbing, cyanosis.  There was 1+ edema, right leg more than left and there was 4 x 4 cm right leg abrasion and chronically healing ulcers noted.  LABORATORY DATA:  Her labs; sodium was 136, potassium 4.1, BUN 18, and creatinine 1.02.  Hemoglobin was 8.5, hematocrit 29.3, and white count of 20.8.  Her lactic acid was 2.36 which was elevated.  Repeat labs; hemoglobin 9.1, hematocrit 31.1, and white count of 16.2, which is trending down.  Sodium was 137, potassium 3.8, BUN 15, and creatinine 1.02.  Her cultures so far has been negative.  BRIEF HOSPITAL COURSE:  The patient was admitted to regular floor and was started on broad-spectrum antibiotics with vancomycin and Zosyn. Pancultures were obtained, which has been negative so far.  Repeat chest x-ray showed no evidence of infiltrate.  The patient remained afebrile for last few days.  Her IV antibiotics has been switched to Levaquin.  OT and PT consultations were called.  The patient ambulated in the hallway with assistance.  Discussed with the patient's son regarding skilled nursing facility which they agreed.  The patient has offer from [name] and will be transferred to [name], and will be followed up in my office in 2 weeks.     Eduardo OsierMohan N. [name], M.D.     MNH/MEDQ  D:  07/21/2016  T:  07/21/2016  Job:  [id number]

## 2016-07-21 NOTE — Discharge Instructions (Signed)
Sepsis, Adult Sepsis is a serious bodily reaction to an infection. The infection that causes sepsis may be from a bacteria, a virus, a fungus, or a parasite. Sepsis can result from an infection in any part of the body. Infections that commonly lead to sepsis include skin, lung, and urinary tract infections. Sepsis is a medical emergency that requires immediate treatment at the hospital. In severe cases, it can lead to septic shock. Shock can weaken the heart and cause blood pressure to drop. This can make the central nervous system and the body's organs to stop working. What are the causes? This condition is caused by a severe reaction to a bacterial, viral, fungal, or parasitic infection. The germs that most commonly lead to sepsis include:  Escherichia coli (E. coli).  Staphylococcus aureus (staph).  The most common infections that lead to sepsis include infections of:  The skin.  The lung (pneumonia).  The gut.  The kidneys (urinary tract infection).  What increases the risk? You are more likely to develop this condition if:  You have a weakened disease-fighting (immune) system.  You are 65 or older.  You are female.  You had surgery, or you have been hospitalized.  You have a catheter, breathing tube, or drainage tubes inserted into your body.  You are not getting enough nutrients from food (are malnourished).  You have other long-term (chronic) diseases, including: ? Cancer. ? AIDS. ? Liver disease. ? Lung disease. ? Diabetes.  You have severe burns or injuries.  You inject drugs.  You have heart valve problems.  What are the signs or symptoms? Symptoms of this condition may include:  Fever.  Chills or feeling very cold.  Fast heart rate (tachycardia).  Rapid breathing (hyperventilation).  Shortness of breath.  Confusion or light-headedness.  Changes in skin color. Your skin may look blotchy, pale, or blue.  Cool, clammy skin or sweaty skin.  Skin  rash.  Nausea and vomiting.  Urinating much less than usual.  How is this diagnosed? This condition is diagnosed based on:  Your symptoms.  Your medical history.  A physical exam.  Other tests may also be done to find out the cause of the infection and how severe the sepsis is. These tests may include:  Blood tests.  Urine tests.  Swabs from other areas of the body that may have an infection. These samples may be tested (cultured) to find out what type of bacteria is causing the infection.  Chest X-ray to check for pneumonia. Other imaging tests, such as a CT scan, may also be done.  Lumbar puncture. This is a procedure to remove a small amount of the fluid that surrounds the brain and spinal cord. The fluid is then examined for infection.  How is this treated? This condition is treated in a hospital with antibiotic medicines. You may also receive:  Fluids through an IV tube.  Oxygen and breathing assistance.  Kidney dialysis. This process cleans the blood if the kidneys have failed.  Surgery to remove infected tissue.  Medicines to increase your blood pressure.  Nutrients to correct imbalances in basic body function (metabolism). This may involve receiving important salts and minerals (electrolytes) through an IV and having your blood sugar level adjusted.  Steroid medicines to control your body's reaction to the infection.  Follow these instructions at home: Medicines  Take over-the-counter and prescription medicines only as told by your health care provider.  If you were prescribed an antibiotic or anti-fungal medicine, take it   as told by your health care provider. Do not stop taking the antibiotic or anti-fungal medicine even if you start to feel better. Activity  Rest and gradually return to your normal activities. Ask your health care provider what activities are safe for you.  Try to set small, achievable goals each week, such as dressing yourself,  bathing, or walking up stairs. It may take a while to rebuild your strength.  Try to exercise regularly, if you feel healthy enough to do so. Ask your health care provider what exercises are safe for you. General instructions  Drink enough fluid to keep your urine clear or pale yellow.  Eat a healthy, balanced diet. This includes plenty of fruits and vegetables, whole grains, and lowfat (lean) proteins. Ask your health care provider if you should avoid certain foods.  Keep all follow-up visits as told by your health care provider. This is important. Contact a health care provider if:  You do not feel like you are getting better or regaining strength.  You are having trouble coping with your recovery.  You frequently feel tired.  You feel worse or do not seem to get better after surgery.  You think you may have an infection after surgery. Get help right away if:  You have any symptoms of sepsis.  You have difficulty breathing.  You have a rapid or skipping heartbeat.  You become confused.  You have a high fever.  Your skin becomes blotchy, pale, or blue. These symptoms may represent a serious problem that is an emergency. Do not wait to see if the symptoms will go away. Get medical help right away. Call your local emergency services (911 in the [place] Summary  Sepsis is a medical emergency that requires immediate treatment at the hospital.  This condition is caused by a severe reaction to a bacterial, viral, fungal, or parasitic infection.  This condition is treated in a hospital with antibiotics. Treatment may also include IV fluids, breathing assistance, and kidney dialysis.  If you were prescribed an antibiotic or anti-fungal medicine, take it as told by your health care provider. Do not stop taking the antibiotic or anti-fungal medicine even if you start to feel better. This information is not intended to replace advice given to you by your health care provider. Make  sure you discuss any questions you have with your health care provider. Document Released: 10/08/2002 Document Revised: 12/14/2015 Document Reviewed: 12/14/2015 Elsevier Interactive Patient Education  2017 [employer]  

## 2016-07-21 NOTE — Clinical Social Work Placement (Signed)
   CLINICAL SOCIAL WORK PLACEMENT  NOTE 07/21/16 - DISCHARGED TO [facility]  Date:  07/21/2016  Patient Details  Name: [name] MRN: [id number] Date of Birth: 08-Jan-1919  Clinical Social Work is seeking post-discharge placement for this patient at the Skilled  Nursing Facility level of care (*CSW will initial, date and re-position this form in  chart as items are completed):  Yes   Patient/family provided with [facility] Clinical Social Work Department's list of facilities offering this level of care within the geographic area requested by the patient (or if unable, by the patient's family).  Yes   Patient/family informed of their freedom to choose among providers that offer the needed level of care, that participate in Medicare, Medicaid or managed care program needed by the patient, have an available bed and are willing to accept the patient.  Yes   Patient/family informed of [facility]'s ownership interest in [facility] and [facility], as well as of the fact that they are under no obligation to receive care at these facilities.  PASRR submitted to EDS on       PASRR number received on       Existing PASRR number confirmed on 07/20/16     FL2 transmitted to all facilities in geographic area requested by pt/family on       FL2 transmitted to all facilities within larger geographic area on       Patient informed that his/her managed care company has contracts with or will negotiate with certain facilities, including the following:        Yes   Patient/family informed of bed offers received.  [facility]     Physician recommends and patient chooses bed at      [facility].  Patient to be transferred to facility by  son, Lamar BlinksJohn Joyce     Patient family notified on  07/21/16 of transfer.  Name of family member notified:   Son, Lamar BlinksJohn Joyce     PHYSICIAN        Additional Comment:    _______________________________________________ Cristobal Goldmannrawford, Aadi Bordner Bradley, LCSW 07/21/2016, 6:44 PM

## 2016-07-21 NOTE — Discharge Summary (Signed)
Physician Discharge Summary  Patient ID: [name] MRN: [id number] DOB/AGE: 1918/12/12 83 y.o.  Admit date: 07/16/2016 Discharge date: 07/21/2016  Admission Diagnoses: S/P Fall Sepsis Severe aortic stenosis Hypertension Hypothyroidism Systolic left heart failure Osteoarthritis Osteopenia Anemia of chronic disease H/O Compression fracture of L1 spine S/P prosthetic left hip Atelectasis of right lung  Discharge Diagnoses:  Principle problem: * Fall * Active Problems:   Sepsis (HCC)   Severe aortic stenosis   Hypertension   Hypothyroidism   Left systolic heart failure   Osteoarthritis   Osteopenia   Anemia of chronic disease   H/O L1 spine compression fracture   S/P prosthetic left hip   Atelectasis of right lung   Hypocalcemia  Discharged Condition: fair  Hospital Course: 83 year old female admitted for fall and superficial injuries. She has multiple medical condition. She had elevated WBC count and possible pneumonia. Her condition improves with breathing treatment and supportive care. She was discharged to SNF due to morbid conditions needing 24 hour supervised care. She will have f/u by primary care as arranged.  Consults: cardiology  Significant Diagnostic Studies: labs: Elevated WBC count, Low Hgb of 8.5 and slightly elevated platelets count. Calcium low at 7.9 and Creatinine at 1.2 otherwise normal BMET. TSH normal.  Chest x-ray, Tib-fib and pelvis x-rays are unremarkable for fracture. Old Lumbar compression fracture seen.  Treatments: IV hydration + home medications + Feeding supplement and Levofloxacin for 5 more days.  Discharge Exam: Blood pressure (!) 161/53, pulse (!) 59, temperature 98.4 F (36.9 C), temperature source Oral, resp. rate 18, height 5' (1.524 m), weight 48.1 kg (106 lb), SpO2 97 %. General appearance: alert, cooperative and appears stated age. Head: Normocephalic, atraumatic. Eyes: Pale conjunctiva, corneas clear. PERRL, EOM's  intact.  Neck: No adenopathy, no carotid bruit, no JVD, supple, symmetrical, trachea midline and thyroid not enlarged. Resp: Clearing to auscultation bilaterally. Cardio: Regular rate and rhythm, S1, S2 normal, III/VI systolic murmur, no click, rub or gallop. GI: Soft, non-tender; bowel sounds normal. Extremities: Trace right leg edema with abrasion, no cyanosis or clubbing. Skin: Warm and dry.  Neurologic: Alert and oriented X 2.  Disposition: 01-Home or Self Care   Allergies as of 07/21/2016   No Known Allergies     Medication List    TAKE these medications   amiodarone 200 MG tablet Commonly known as:  PACERONE Take 200 mg by mouth daily.   CALCIUM-D PO Take 1 tablet by mouth daily.   denosumab 60 MG/ML Soln injection Commonly known as:  PROLIA Inject 60 mg into the skin every 6 (six) months. Administer in upper arm, thigh, or abdomen  Next injection due August 01, 2016   digoxin 0.125 MG tablet Commonly known as:  LANOXIN Take 0.0625 mg by mouth daily.   feeding supplement (ENSURE ENLIVE) Liqd Take 237 mLs by mouth 2 (two) times daily between meals.   ferrous sulfate 220 (44 Fe) MG/5ML solution Take 5 mLs (220 mg total) by mouth 2 (two) times daily with a meal.   FISH OIL PO Take 1 capsule by mouth daily.   furosemide 40 MG tablet Commonly known as:  LASIX Take 40 mg alternating with 80 mg every other day What changed:  how much to take  how to take this  when to take this  additional instructions   HAIR/SKIN/NAILS/BIOTIN Tabs Take 1 tablet by mouth daily.   levofloxacin 500 MG tablet Commonly known as:  LEVAQUIN Take 1 tablet (500 mg total) by  mouth every other day.   levothyroxine 100 MCG tablet Commonly known as:  SYNTHROID, LEVOTHROID Take 100 mcg by mouth daily.   loperamide 2 MG tablet Commonly known as:  IMODIUM A-D Take 2 mg by mouth 4 (four) times daily as needed for diarrhea or loose stools.   metoprolol succinate 25 MG 24 hr  tablet Commonly known as:  TOPROL-XL Take 25 mg by mouth daily.   potassium chloride SA 20 MEQ tablet Commonly known as:  K-DUR,KLOR-CON Take 10 mEq by mouth daily.   traZODone 50 MG tablet Commonly known as:  DESYREL Take 50 mg by mouth at bedtime.   trolamine salicylate 10 % cream Commonly known as:  ASPERCREME Apply 1 application topically at bedtime as needed (right lower leg/ pain, circulation).   TYLENOL 8 HOUR 650 MG CR tablet Generic drug:  acetaminophen Take 1,300 mg by mouth at bedtime.   VITAMIN B-12 PO Take 1 tablet by mouth daily.       Contact information for follow-up providers    Rinaldo Cloud, MD Follow up in 2 week(s).   Specialty:  Cardiology Contact information: [address] [place] [address] [phone]            Contact information for after-discharge care    Destination    [facility] .   Specialty:  Skilled Nursing Facility Contact information: [address] [place] [address] [phone]                  Signed: Ricki Rodriguez 07/21/2016, 6:13 PM

## 2016-07-21 NOTE — Progress Notes (Signed)
Patient's discharge summary faxed to [facility] and gave again the report to the night shift nurse.

## 2016-07-25 ENCOUNTER — Encounter (HOSPITAL_BASED_OUTPATIENT_CLINIC_OR_DEPARTMENT_OTHER): Payer: No Typology Code available for payment source | Attending: Surgery

## 2016-07-25 DIAGNOSIS — I70235 Atherosclerosis of native arteries of right leg with ulceration of other part of foot: Secondary | ICD-10-CM | POA: Insufficient documentation

## 2016-07-25 DIAGNOSIS — Z79899 Other long term (current) drug therapy: Secondary | ICD-10-CM | POA: Diagnosis not present

## 2016-07-25 DIAGNOSIS — E440 Moderate protein-calorie malnutrition: Secondary | ICD-10-CM | POA: Diagnosis not present

## 2016-07-25 DIAGNOSIS — I509 Heart failure, unspecified: Secondary | ICD-10-CM | POA: Insufficient documentation

## 2016-07-25 DIAGNOSIS — L97821 Non-pressure chronic ulcer of other part of left lower leg limited to breakdown of skin: Secondary | ICD-10-CM | POA: Diagnosis not present

## 2016-07-25 DIAGNOSIS — I110 Hypertensive heart disease with heart failure: Secondary | ICD-10-CM | POA: Diagnosis not present

## 2016-07-25 DIAGNOSIS — S81811A Laceration without foreign body, right lower leg, initial encounter: Secondary | ICD-10-CM | POA: Insufficient documentation

## 2016-07-25 DIAGNOSIS — Z515 Encounter for palliative care: Secondary | ICD-10-CM | POA: Insufficient documentation

## 2016-07-25 DIAGNOSIS — I482 Chronic atrial fibrillation: Secondary | ICD-10-CM | POA: Insufficient documentation

## 2016-07-25 DIAGNOSIS — Z66 Do not resuscitate: Secondary | ICD-10-CM | POA: Diagnosis not present

## 2016-08-01 ENCOUNTER — Ambulatory Visit: Payer: Medicare Other

## 2016-08-01 DIAGNOSIS — I70235 Atherosclerosis of native arteries of right leg with ulceration of other part of foot: Secondary | ICD-10-CM | POA: Diagnosis not present

## 2016-08-08 DIAGNOSIS — I70235 Atherosclerosis of native arteries of right leg with ulceration of other part of foot: Secondary | ICD-10-CM | POA: Diagnosis not present

## 2016-08-15 ENCOUNTER — Telehealth: Payer: Self-pay

## 2016-08-15 NOTE — Telephone Encounter (Signed)
Left vm requesting patient call so that we can schedule her prolia injection- there will be no out of pocket cost and no PA is needed- she is due this month for her injection

## 2016-08-15 NOTE — Telephone Encounter (Signed)
Patient's son "Jonny RuizJohn" would like a call back to discuss Prolia.  Thank you,  -LL

## 2016-08-16 NOTE — Telephone Encounter (Signed)
Called and left [name] a vm letting him know that his mother is due for her Prolia injection and there is no out of pocket cost or PA to do- I requested he call me back to discuss when we can schedule the injection

## 2016-08-29 ENCOUNTER — Ambulatory Visit: Payer: Medicare Other

## 2016-09-04 ENCOUNTER — Telehealth: Payer: Self-pay

## 2016-09-04 NOTE — Telephone Encounter (Signed)
This patient is deceased she passed away 

## 2016-09-23 DEATH — deceased

## 2017-07-31 IMAGING — CR DG CHEST 1V PORT
1 series · 1 of 1 positions shown · non-contrast
Comparison: 12/30/2015.

CLINICAL DATA: Mirlene rib for the past 2 days. Some blood in the
stools. Weakness.

EXAM:
PORTABLE CHEST 1 VIEW

[AP]
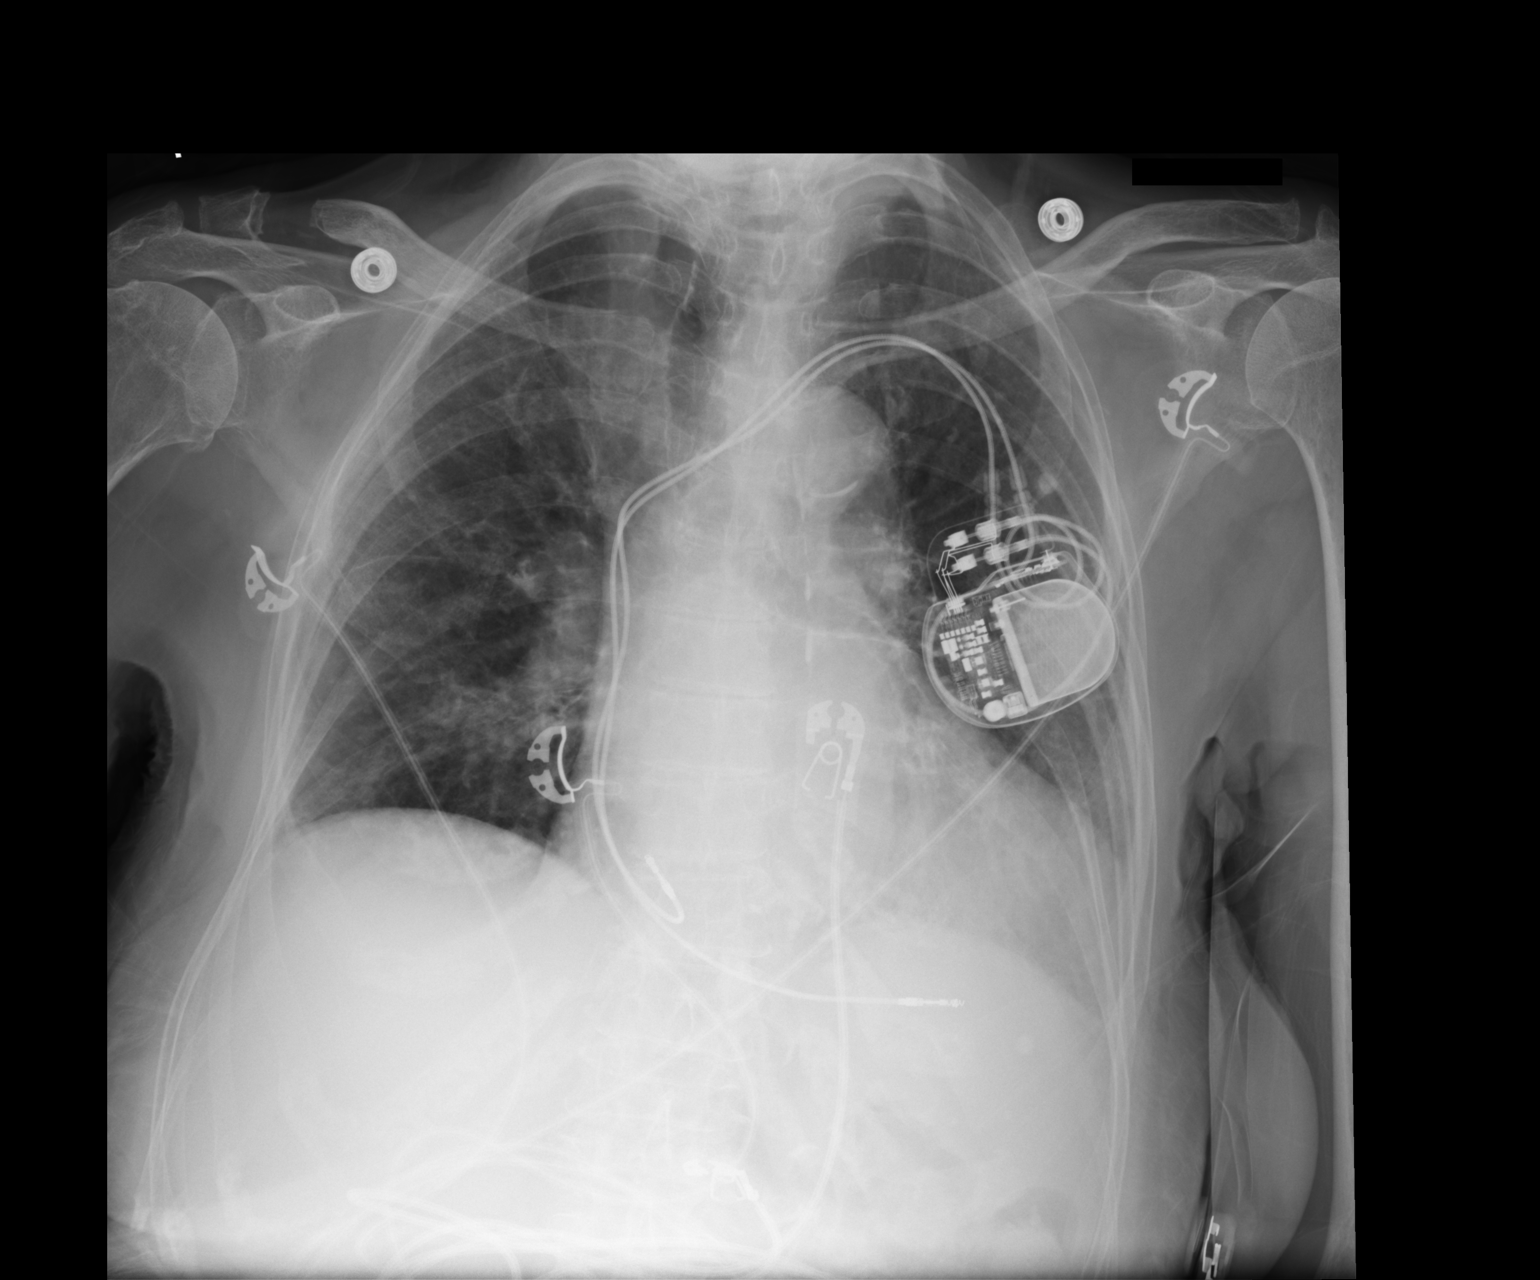

[1 of 1 positions shown; findings below may reference images not displayed]

FINDINGS: Stable enlarged cardiac silhouette and tortuous and calcified
thoracic aorta. Stable left subclavian pacemaker leads. Stable left
upper lobe calcified granuloma. Otherwise, clear lungs. Stable old,
distal right clavicle fracture with nonunion and widening of the
coracoclavicular distance.
IMPRESSION: No acute abnormality.  Stable cardiomegaly.

## 2017-08-09 ENCOUNTER — Encounter: Payer: Self-pay | Admitting: Cardiology

## 2017-10-03 IMAGING — CR DG HIP (WITH OR WITHOUT PELVIS) 2V BILAT
5 series · 5 of 5 positions shown · non-contrast
Comparison: None in PACs

CLINICAL DATA: Status post fall, generalized hip pain.

EXAM:
DG HIP (WITH OR WITHOUT PELVIS) 2V BILAT

[pelvis ap]
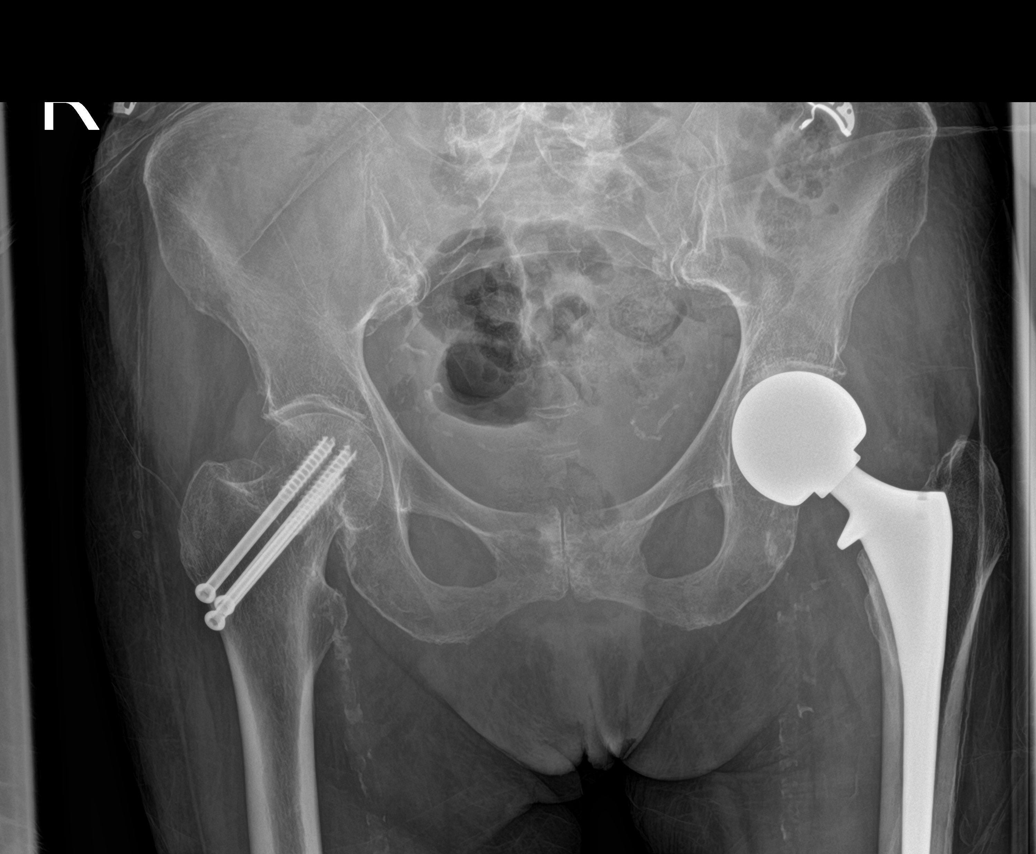

[hip ap (1 of 2)]
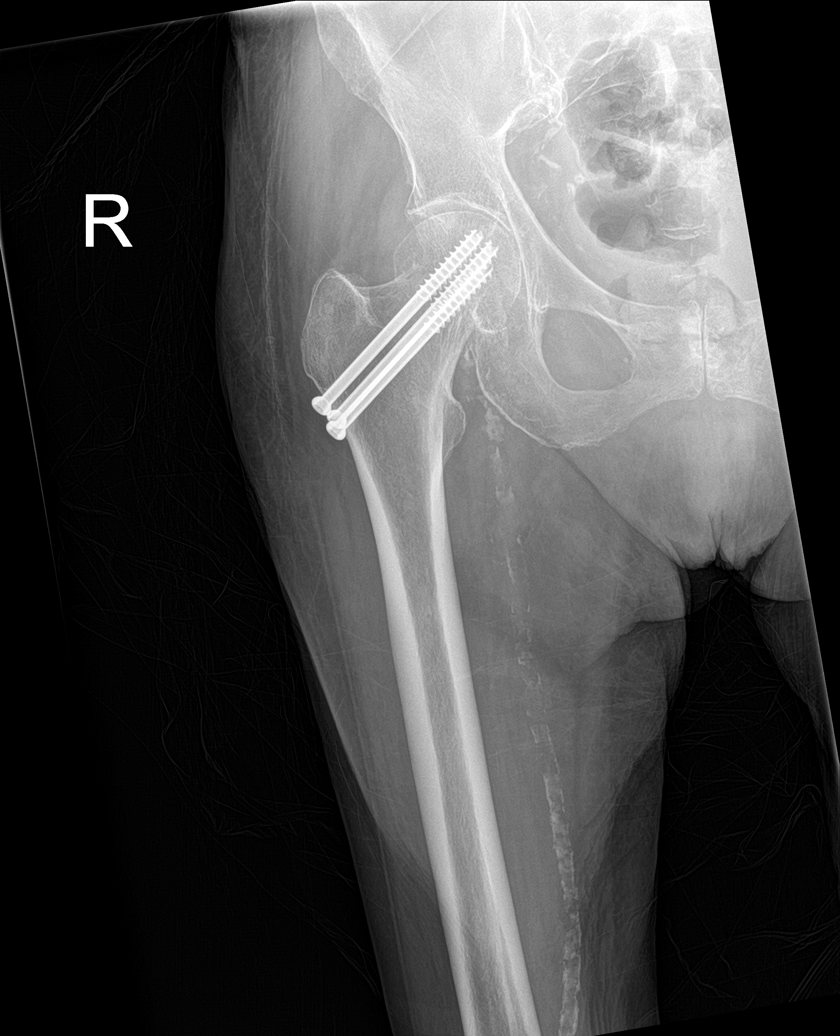

[hip lat (1 of 2)]
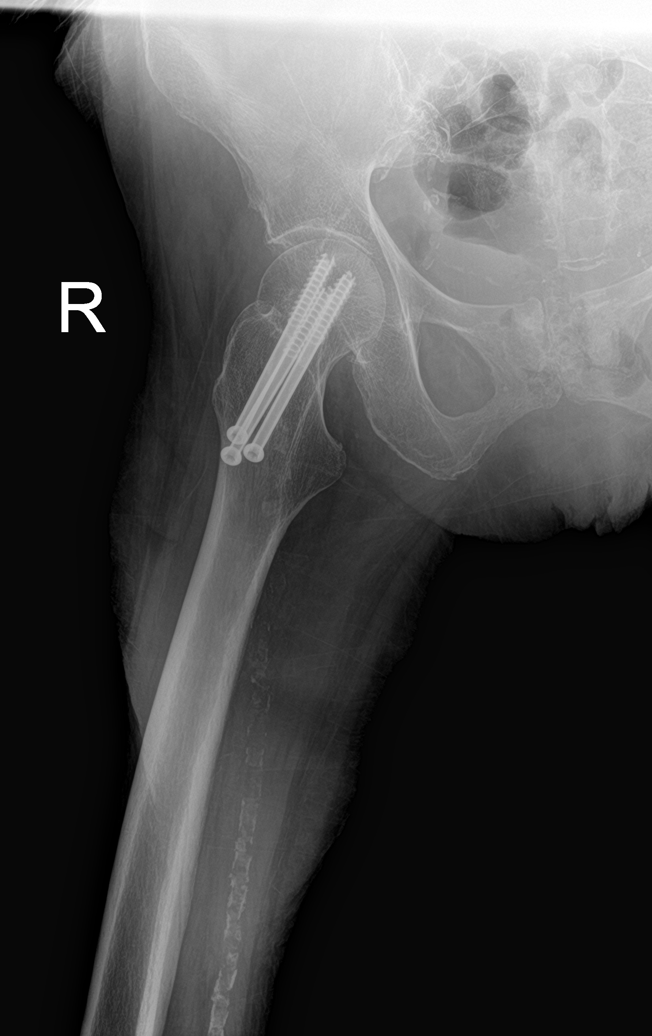

[hip ap (2 of 2)]
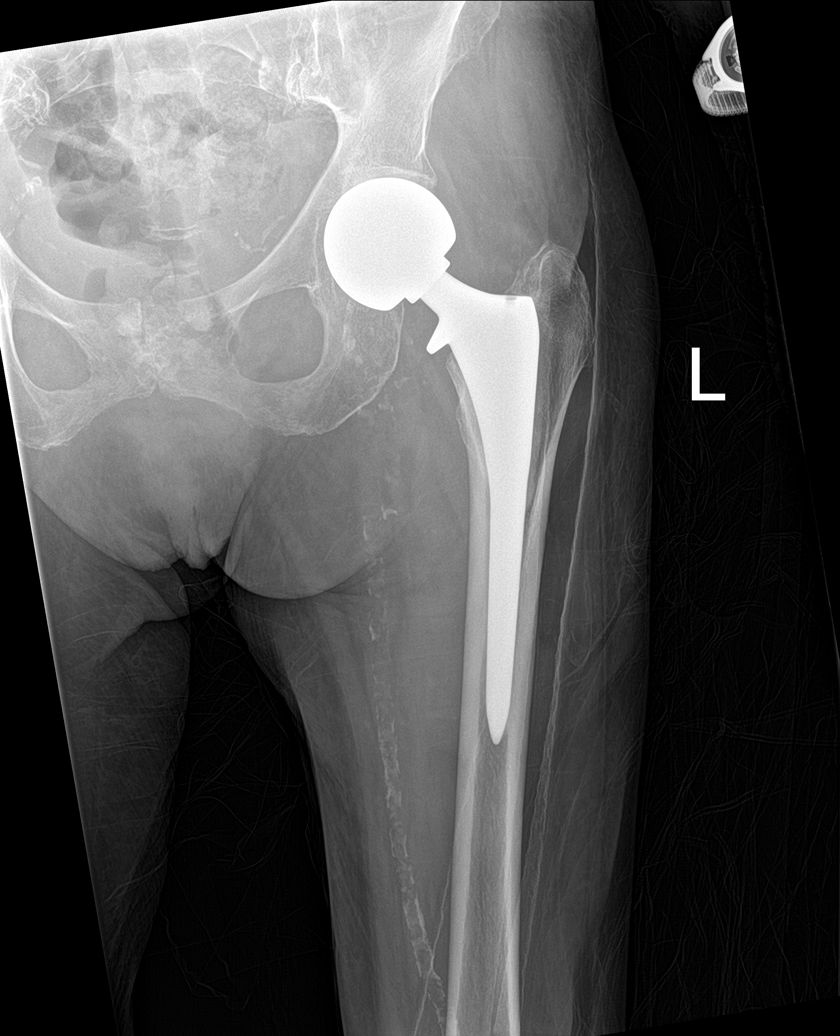

[hip lat (2 of 2)]
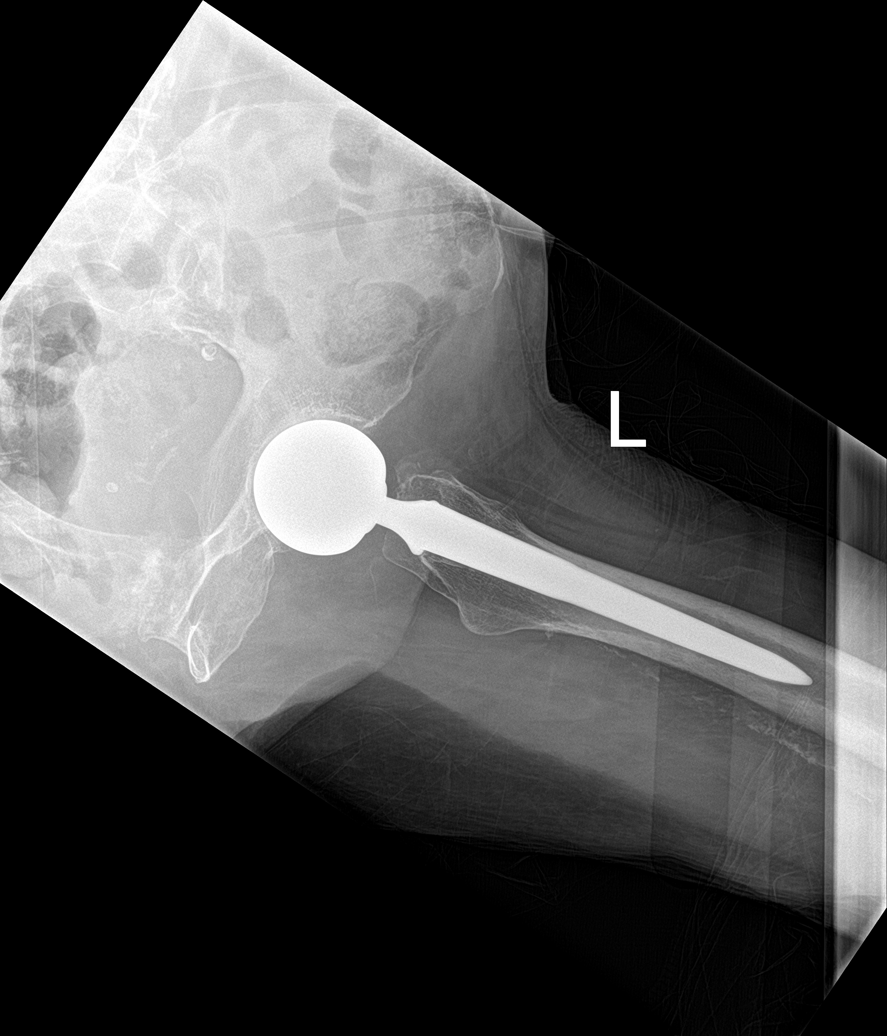

[5 of 5 positions shown; findings below may reference images not displayed]

FINDINGS: The bony pelvis is subjectively osteopenic. No pelvic fracture is
observed. The patient has undergone left total hip joint prosthesis
placement. Radiographic positioning of the prosthetic components
appears good. The native bone appears intact. The patient has
undergone previous ORIF with pinning for a subcapital fracture of
the right hip. No acute right hip fracture is observed. There is
moderate asymmetric narrowing of the right hip joint space.
IMPRESSION: No acute fracture or dislocation is observed. There is significant
degenerative change of the native right hip. The prosthetic left hip
appears normal.
